# Patient Record
Sex: Female | Born: 1965 | Race: White | Hispanic: No | Marital: Single | Smoking: Current every day smoker
Health system: Southern US, Community
[De-identification: ages and names within clinical notes are randomized; demographics above are authoritative.]

## PROBLEM LIST (undated history)

## (undated) DIAGNOSIS — O00109 Unspecified tubal pregnancy without intrauterine pregnancy: Secondary | ICD-10-CM

## (undated) DIAGNOSIS — R440 Auditory hallucinations: Secondary | ICD-10-CM

## (undated) DIAGNOSIS — I10 Essential (primary) hypertension: Secondary | ICD-10-CM

## (undated) DIAGNOSIS — Z872 Personal history of diseases of the skin and subcutaneous tissue: Secondary | ICD-10-CM

## (undated) DIAGNOSIS — B182 Chronic viral hepatitis C: Secondary | ICD-10-CM

## (undated) DIAGNOSIS — IMO0001 Reserved for inherently not codable concepts without codable children: Secondary | ICD-10-CM

## (undated) DIAGNOSIS — K219 Gastro-esophageal reflux disease without esophagitis: Secondary | ICD-10-CM

## (undated) DIAGNOSIS — R441 Visual hallucinations: Secondary | ICD-10-CM

## (undated) DIAGNOSIS — Z720 Tobacco use: Secondary | ICD-10-CM

## (undated) DIAGNOSIS — F419 Anxiety disorder, unspecified: Secondary | ICD-10-CM

## (undated) DIAGNOSIS — F329 Major depressive disorder, single episode, unspecified: Secondary | ICD-10-CM

## (undated) DIAGNOSIS — B009 Herpesviral infection, unspecified: Secondary | ICD-10-CM

## (undated) DIAGNOSIS — F32A Depression, unspecified: Secondary | ICD-10-CM

## (undated) DIAGNOSIS — F319 Bipolar disorder, unspecified: Secondary | ICD-10-CM

## (undated) DIAGNOSIS — F209 Schizophrenia, unspecified: Secondary | ICD-10-CM

## (undated) DIAGNOSIS — F1010 Alcohol abuse, uncomplicated: Secondary | ICD-10-CM

## (undated) HISTORY — PX: WISDOM TOOTH EXTRACTION: SHX21

---

## 1989-04-18 HISTORY — PX: OTHER SURGICAL HISTORY: SHX169

## 1996-04-18 HISTORY — PX: LIVER BIOPSY: SHX301

## 1997-07-23 ENCOUNTER — Emergency Department (HOSPITAL_COMMUNITY): Admission: EM | Admit: 1997-07-23 | Discharge: 1997-07-23 | Payer: Self-pay | Admitting: Emergency Medicine

## 1997-11-06 ENCOUNTER — Inpatient Hospital Stay (HOSPITAL_COMMUNITY): Admission: AD | Admit: 1997-11-06 | Discharge: 1997-11-06 | Payer: Self-pay | Admitting: Obstetrics & Gynecology

## 1999-07-20 ENCOUNTER — Inpatient Hospital Stay (HOSPITAL_COMMUNITY): Admission: AD | Admit: 1999-07-20 | Discharge: 1999-07-20 | Payer: Self-pay | Admitting: *Deleted

## 1999-07-21 ENCOUNTER — Inpatient Hospital Stay (HOSPITAL_COMMUNITY): Admission: RE | Admit: 1999-07-21 | Discharge: 1999-07-21 | Payer: Self-pay | Admitting: *Deleted

## 1999-07-28 ENCOUNTER — Inpatient Hospital Stay (HOSPITAL_COMMUNITY): Admission: AD | Admit: 1999-07-28 | Discharge: 1999-07-28 | Payer: Self-pay | Admitting: Obstetrics

## 1999-09-08 ENCOUNTER — Other Ambulatory Visit: Admission: RE | Admit: 1999-09-08 | Discharge: 1999-09-08 | Payer: Self-pay | Admitting: Obstetrics and Gynecology

## 1999-10-22 ENCOUNTER — Ambulatory Visit (HOSPITAL_COMMUNITY): Admission: RE | Admit: 1999-10-22 | Discharge: 1999-10-22 | Payer: Self-pay | Admitting: Obstetrics and Gynecology

## 1999-10-22 ENCOUNTER — Encounter: Payer: Self-pay | Admitting: Obstetrics and Gynecology

## 2000-01-17 ENCOUNTER — Encounter: Payer: Self-pay | Admitting: Obstetrics and Gynecology

## 2000-01-17 ENCOUNTER — Ambulatory Visit (HOSPITAL_COMMUNITY): Admission: RE | Admit: 2000-01-17 | Discharge: 2000-01-17 | Payer: Self-pay | Admitting: Obstetrics and Gynecology

## 2000-03-14 ENCOUNTER — Ambulatory Visit (HOSPITAL_COMMUNITY): Admission: RE | Admit: 2000-03-14 | Discharge: 2000-03-14 | Payer: Self-pay | Admitting: Obstetrics and Gynecology

## 2000-03-14 ENCOUNTER — Encounter: Payer: Self-pay | Admitting: Obstetrics and Gynecology

## 2000-03-20 ENCOUNTER — Inpatient Hospital Stay (HOSPITAL_COMMUNITY): Admission: AD | Admit: 2000-03-20 | Discharge: 2000-03-22 | Payer: Self-pay | Admitting: Obstetrics and Gynecology

## 2003-06-06 ENCOUNTER — Encounter: Admission: RE | Admit: 2003-06-06 | Discharge: 2003-06-06 | Payer: Self-pay | Admitting: Otolaryngology

## 2003-12-11 ENCOUNTER — Ambulatory Visit (HOSPITAL_COMMUNITY): Admission: RE | Admit: 2003-12-11 | Discharge: 2003-12-11 | Payer: Self-pay | Admitting: Gastroenterology

## 2003-12-11 ENCOUNTER — Encounter (INDEPENDENT_AMBULATORY_CARE_PROVIDER_SITE_OTHER): Payer: Self-pay | Admitting: *Deleted

## 2004-03-13 ENCOUNTER — Inpatient Hospital Stay (HOSPITAL_COMMUNITY): Admission: AD | Admit: 2004-03-13 | Discharge: 2004-03-13 | Payer: Self-pay | Admitting: Obstetrics and Gynecology

## 2004-05-05 ENCOUNTER — Other Ambulatory Visit: Admission: RE | Admit: 2004-05-05 | Discharge: 2004-05-05 | Payer: Self-pay | Admitting: Obstetrics and Gynecology

## 2004-05-07 ENCOUNTER — Encounter (INDEPENDENT_AMBULATORY_CARE_PROVIDER_SITE_OTHER): Payer: Self-pay | Admitting: *Deleted

## 2004-05-07 ENCOUNTER — Ambulatory Visit (HOSPITAL_COMMUNITY): Admission: RE | Admit: 2004-05-07 | Discharge: 2004-05-07 | Payer: Self-pay | Admitting: Obstetrics and Gynecology

## 2004-05-07 HISTORY — PX: OTHER SURGICAL HISTORY: SHX169

## 2005-12-14 ENCOUNTER — Ambulatory Visit: Payer: Self-pay | Admitting: Internal Medicine

## 2005-12-16 ENCOUNTER — Ambulatory Visit: Payer: Self-pay | Admitting: Internal Medicine

## 2005-12-29 ENCOUNTER — Ambulatory Visit: Payer: Self-pay | Admitting: Gastroenterology

## 2006-02-02 ENCOUNTER — Ambulatory Visit: Payer: Self-pay | Admitting: Gastroenterology

## 2006-03-31 ENCOUNTER — Ambulatory Visit: Payer: Self-pay | Admitting: Gastroenterology

## 2006-05-10 ENCOUNTER — Ambulatory Visit: Payer: Self-pay | Admitting: Internal Medicine

## 2006-05-10 LAB — CONVERTED CEMR LAB
Albumin: 4.3 g/dL (ref 3.5–5.2)
Cholesterol: 250 mg/dL (ref 0–200)
Total Protein: 8 g/dL (ref 6.0–8.3)
Triglycerides: 57 mg/dL (ref 0–149)
VLDL: 11 mg/dL (ref 0–40)

## 2006-05-26 ENCOUNTER — Ambulatory Visit: Payer: Self-pay | Admitting: Internal Medicine

## 2006-06-01 ENCOUNTER — Ambulatory Visit: Payer: Self-pay | Admitting: Internal Medicine

## 2006-06-12 ENCOUNTER — Ambulatory Visit: Payer: Self-pay

## 2007-03-05 ENCOUNTER — Ambulatory Visit: Payer: Self-pay | Admitting: Internal Medicine

## 2007-03-05 DIAGNOSIS — K3189 Other diseases of stomach and duodenum: Secondary | ICD-10-CM

## 2007-03-05 DIAGNOSIS — R1013 Epigastric pain: Secondary | ICD-10-CM

## 2007-03-06 ENCOUNTER — Telehealth (INDEPENDENT_AMBULATORY_CARE_PROVIDER_SITE_OTHER): Payer: Self-pay | Admitting: *Deleted

## 2007-03-07 ENCOUNTER — Ambulatory Visit: Payer: Self-pay | Admitting: Internal Medicine

## 2007-03-08 LAB — CONVERTED CEMR LAB
Amylase: 97 units/L (ref 27–131)
Basophils Relative: 0.4 % (ref 0.0–1.0)
HCT: 40.9 % (ref 36.0–46.0)
Lymphocytes Relative: 18.8 % (ref 12.0–46.0)
MCHC: 35.3 g/dL (ref 30.0–36.0)
Neutro Abs: 4.7 10*3/uL (ref 1.4–7.7)
RBC: 4.19 M/uL (ref 3.87–5.11)
RDW: 12.2 % (ref 11.5–14.6)
WBC: 6.8 10*3/uL (ref 4.5–10.5)

## 2007-03-13 ENCOUNTER — Telehealth: Payer: Self-pay | Admitting: Internal Medicine

## 2007-03-16 ENCOUNTER — Ambulatory Visit: Payer: Self-pay | Admitting: Internal Medicine

## 2007-03-16 ENCOUNTER — Encounter (INDEPENDENT_AMBULATORY_CARE_PROVIDER_SITE_OTHER): Payer: Self-pay | Admitting: *Deleted

## 2007-04-02 ENCOUNTER — Encounter (INDEPENDENT_AMBULATORY_CARE_PROVIDER_SITE_OTHER): Payer: Self-pay | Admitting: *Deleted

## 2007-04-09 ENCOUNTER — Telehealth: Payer: Self-pay | Admitting: Internal Medicine

## 2007-05-18 ENCOUNTER — Ambulatory Visit: Payer: Self-pay | Admitting: Internal Medicine

## 2007-05-18 DIAGNOSIS — L723 Sebaceous cyst: Secondary | ICD-10-CM

## 2007-05-18 DIAGNOSIS — L0390 Cellulitis, unspecified: Secondary | ICD-10-CM

## 2007-05-18 DIAGNOSIS — L0291 Cutaneous abscess, unspecified: Secondary | ICD-10-CM | POA: Insufficient documentation

## 2007-05-23 ENCOUNTER — Telehealth: Payer: Self-pay | Admitting: Internal Medicine

## 2007-05-25 ENCOUNTER — Telehealth (INDEPENDENT_AMBULATORY_CARE_PROVIDER_SITE_OTHER): Payer: Self-pay | Admitting: Family Medicine

## 2007-08-08 ENCOUNTER — Encounter: Admission: RE | Admit: 2007-08-08 | Discharge: 2007-08-08 | Payer: Self-pay | Admitting: Obstetrics and Gynecology

## 2007-08-21 ENCOUNTER — Ambulatory Visit: Payer: Self-pay | Admitting: Internal Medicine

## 2007-08-21 DIAGNOSIS — M542 Cervicalgia: Secondary | ICD-10-CM | POA: Insufficient documentation

## 2007-10-04 ENCOUNTER — Telehealth: Payer: Self-pay | Admitting: Internal Medicine

## 2008-01-29 ENCOUNTER — Ambulatory Visit: Payer: Self-pay | Admitting: Gastroenterology

## 2008-02-27 ENCOUNTER — Ambulatory Visit: Payer: Self-pay | Admitting: Internal Medicine

## 2008-04-15 ENCOUNTER — Ambulatory Visit: Payer: Self-pay | Admitting: Gastroenterology

## 2008-11-24 ENCOUNTER — Emergency Department (HOSPITAL_COMMUNITY): Admission: EM | Admit: 2008-11-24 | Discharge: 2008-11-24 | Payer: Self-pay | Admitting: Emergency Medicine

## 2009-04-30 ENCOUNTER — Ambulatory Visit: Payer: Self-pay | Admitting: Gastroenterology

## 2009-10-12 ENCOUNTER — Emergency Department (HOSPITAL_BASED_OUTPATIENT_CLINIC_OR_DEPARTMENT_OTHER): Admission: EM | Admit: 2009-10-12 | Discharge: 2009-10-12 | Payer: Self-pay | Admitting: Emergency Medicine

## 2009-11-20 ENCOUNTER — Inpatient Hospital Stay (HOSPITAL_COMMUNITY): Admission: EM | Admit: 2009-11-20 | Discharge: 2009-11-21 | Payer: Self-pay | Admitting: Emergency Medicine

## 2010-01-17 ENCOUNTER — Other Ambulatory Visit: Payer: Self-pay | Admitting: Emergency Medicine

## 2010-01-18 ENCOUNTER — Ambulatory Visit: Payer: Self-pay | Admitting: Psychiatry

## 2010-01-18 ENCOUNTER — Inpatient Hospital Stay (HOSPITAL_COMMUNITY): Admission: AD | Admit: 2010-01-18 | Discharge: 2010-01-22 | Payer: Self-pay | Admitting: Psychiatry

## 2010-07-01 LAB — COMPREHENSIVE METABOLIC PANEL
AST: 64 U/L — ABNORMAL HIGH (ref 0–37)
Albumin: 4.4 g/dL (ref 3.5–5.2)
Alkaline Phosphatase: 66 U/L (ref 39–117)
BUN: 4 mg/dL — ABNORMAL LOW (ref 6–23)
CO2: 21 mEq/L (ref 19–32)
Chloride: 97 mEq/L (ref 96–112)
GFR calc Af Amer: >60 mL/min (ref >60)
GFR calc non Af Amer: >60 mL/min (ref >60)
Sodium: 131 mEq/L — ABNORMAL LOW (ref 135–145)
Total Bilirubin: 1.3 mg/dL — ABNORMAL HIGH (ref 0.3–1.2)

## 2010-07-01 LAB — ETHANOL: Alcohol, Ethyl (B): <5 mg/dL (ref 0–10)

## 2010-07-01 LAB — DIFFERENTIAL
Eosinophils Absolute: 0 10*3/uL (ref 0.0–0.7)
Lymphocytes Relative: 12 % (ref 12–46)
Lymphs Abs: 1.1 10*3/uL (ref 0.7–4.0)
Monocytes Relative: 7 % (ref 3–12)
Neutro Abs: 7.2 10*3/uL (ref 1.7–7.7)

## 2010-07-01 LAB — RAPID URINE DRUG SCREEN, HOSP PERFORMED: Amphetamines: NOT DETECTED

## 2010-07-01 LAB — CBC
Hemoglobin: 15.1 g/dL — ABNORMAL HIGH (ref 12.0–15.0)
MCH: 34.7 pg — ABNORMAL HIGH (ref 26.0–34.0)
MCV: 98.6 fL (ref 78.0–100.0)
Platelets: 181 10*3/uL (ref 150–400)
RBC: 4.36 MIL/uL (ref 3.87–5.11)
RDW: 14.3 % (ref 11.5–15.5)

## 2010-07-01 LAB — POCT PREGNANCY, URINE: Preg Test, Ur: NEGATIVE

## 2010-07-02 LAB — COMPREHENSIVE METABOLIC PANEL
ALT: 55 U/L — ABNORMAL HIGH (ref 0–35)
BUN: 1 mg/dL — ABNORMAL LOW (ref 6–23)
Creatinine, Ser: 0.62 mg/dL (ref 0.4–1.2)
Sodium: 126 mEq/L — ABNORMAL LOW (ref 135–145)
Total Bilirubin: 1.2 mg/dL (ref 0.3–1.2)

## 2010-07-02 LAB — POCT I-STAT, CHEM 8
BUN: <3 mg/dL — ABNORMAL LOW (ref 6–23)
Chloride: 90 mEq/L — ABNORMAL LOW (ref 96–112)
Creatinine, Ser: 0.7 mg/dL (ref 0.4–1.2)
HCT: 45 % (ref 36.0–46.0)
Hemoglobin: 15.3 g/dL — ABNORMAL HIGH (ref 12.0–15.0)
Potassium: 3.5 mEq/L (ref 3.5–5.1)
Sodium: 123 mEq/L — ABNORMAL LOW (ref 135–145)

## 2010-07-02 LAB — RAPID URINE DRUG SCREEN, HOSP PERFORMED
Amphetamines: NOT DETECTED
Barbiturates: NOT DETECTED
Benzodiazepines: NOT DETECTED
Tetrahydrocannabinol: POSITIVE — AB

## 2010-07-02 LAB — CBC
Platelets: 190 10*3/uL (ref 150–400)
RBC: 3.99 MIL/uL (ref 3.87–5.11)
RDW: 13.9 % (ref 11.5–15.5)
WBC: 7.3 10*3/uL (ref 4.0–10.5)

## 2010-07-02 LAB — BASIC METABOLIC PANEL
BUN: 2 mg/dL — ABNORMAL LOW (ref 6–23)
GFR calc Af Amer: >60 mL/min (ref >60)
GFR calc non Af Amer: >60 mL/min (ref >60)
Glucose, Bld: 74 mg/dL (ref 70–99)

## 2010-07-02 LAB — MAGNESIUM: Magnesium: 1.9 mg/dL (ref 1.5–2.5)

## 2010-07-02 LAB — DIFFERENTIAL
Basophils Absolute: 0 10*3/uL (ref 0.0–0.1)
Basophils Relative: 0 % (ref 0–1)
Eosinophils Absolute: 0 10*3/uL (ref 0.0–0.7)
Eosinophils Relative: 0 % (ref 0–5)
Lymphs Abs: 0.8 10*3/uL (ref 0.7–4.0)
Monocytes Relative: 12 % (ref 3–12)
Neutro Abs: 5.6 10*3/uL (ref 1.7–7.7)

## 2010-07-02 LAB — PROTIME-INR
INR: 1.02 (ref 0.00–1.49)
Prothrombin Time: 13.6 seconds (ref 11.6–15.2)

## 2010-07-04 LAB — DIFFERENTIAL
Basophils Absolute: 0 10*3/uL (ref 0.0–0.1)
Basophils Relative: 0 % (ref 0–1)
Eosinophils Relative: 1 % (ref 0–5)
Lymphocytes Relative: 7 % — ABNORMAL LOW (ref 12–46)
Monocytes Absolute: 0.8 10*3/uL (ref 0.1–1.0)

## 2010-07-04 LAB — COMPREHENSIVE METABOLIC PANEL
ALT: 59 U/L — ABNORMAL HIGH (ref 0–35)
AST: 64 U/L — ABNORMAL HIGH (ref 0–37)
Albumin: 4.3 g/dL (ref 3.5–5.2)
BUN: 3 mg/dL — ABNORMAL LOW (ref 6–23)
CO2: 26 mEq/L (ref 19–32)
Calcium: 9.4 mg/dL (ref 8.4–10.5)
GFR calc Af Amer: >60 mL/min (ref >60)
GFR calc non Af Amer: >60 mL/min (ref >60)
Sodium: 139 mEq/L (ref 135–145)
Total Bilirubin: 0.8 mg/dL (ref 0.3–1.2)

## 2010-07-04 LAB — CBC
HCT: 36.2 % (ref 36.0–46.0)
Hemoglobin: 12.7 g/dL (ref 12.0–15.0)
MCH: 33.9 pg (ref 26.0–34.0)
MCHC: 35 g/dL (ref 30.0–36.0)
MCV: 96.8 fL (ref 78.0–100.0)
Platelets: 168 10*3/uL (ref 150–400)
RDW: 14 % (ref 11.5–15.5)

## 2010-07-04 LAB — URINALYSIS, ROUTINE W REFLEX MICROSCOPIC
Bilirubin Urine: NEGATIVE
Ketones, ur: NEGATIVE mg/dL
Nitrite: NEGATIVE
Protein, ur: NEGATIVE mg/dL
Specific Gravity, Urine: 1.007 (ref 1.005–1.030)

## 2010-07-04 LAB — POCT TOXICOLOGY PANEL
Cocaine: POSITIVE
TCA Scrn: POSITIVE

## 2010-07-04 LAB — PREGNANCY, URINE: Preg Test, Ur: NEGATIVE

## 2010-07-04 LAB — ETHANOL: Alcohol, Ethyl (B): <10 mg/dL (ref 0–10)

## 2010-07-24 LAB — POCT CARDIAC MARKERS
Myoglobin, poc: 91.5 ng/mL (ref 12–200)
Troponin i, poc: <0.05 ng/mL (ref 0.00–0.09)

## 2010-07-24 LAB — DIFFERENTIAL
Basophils Absolute: 0 10*3/uL (ref 0.0–0.1)
Basophils Relative: 0 % (ref 0–1)
Lymphocytes Relative: 19 % (ref 12–46)
Neutro Abs: 4.2 10*3/uL (ref 1.7–7.7)
Neutrophils Relative %: 71 % (ref 43–77)

## 2010-07-24 LAB — CBC
MCHC: 33.7 g/dL (ref 30.0–36.0)
Platelets: 209 10*3/uL (ref 150–400)
RDW: 14.5 % (ref 11.5–15.5)

## 2010-07-24 LAB — POCT I-STAT, CHEM 8
BUN: <3 mg/dL — ABNORMAL LOW (ref 6–23)
Chloride: 105 mEq/L (ref 96–112)
HCT: 39 % (ref 36.0–46.0)
Sodium: 141 mEq/L (ref 135–145)

## 2010-07-24 LAB — URINE MICROSCOPIC-ADD ON

## 2010-07-24 LAB — URINALYSIS, ROUTINE W REFLEX MICROSCOPIC
Bilirubin Urine: NEGATIVE
Hgb urine dipstick: NEGATIVE
Ketones, ur: NEGATIVE mg/dL
Protein, ur: NEGATIVE mg/dL
Urobilinogen, UA: 1 mg/dL (ref 0.0–1.0)

## 2010-07-24 LAB — ETHANOL: Alcohol, Ethyl (B): <5 mg/dL (ref 0–10)

## 2010-07-24 LAB — GLUCOSE, CAPILLARY: Glucose-Capillary: 115 mg/dL — ABNORMAL HIGH (ref 70–99)

## 2010-07-24 LAB — PREGNANCY, URINE: Preg Test, Ur: NEGATIVE

## 2010-09-03 NOTE — Letter (Signed)
December 16, 2005     Tiffany [name], D.O.  [address]  [place], [place]  [address]   RE:  Tiffany, [name]  MRN:  [id number]  /  DOB:  Jun 03, 1965   Dear Dr. Marlou [name]:   I saw Tiffany [name] on December 14, 2005.  She had multiple complaints.  She  wanted her cholesterol checked but also had complaints about alopecia,  dyspepsia and arthralgias.  She stated that she had also requested a scan to  check for stroke or trauma to the brain.   By history she had an elevated cholesterol approximately 2005 at Urgent Care  in [place].  Those records are not available.  She is on no specific diet  and does not exercise.   She has had dyspepsia daily for several months and intermittently since age  45.  She has no dysphagia or melena.  Prilosec over the counter has been of  benefit.  Pepcid taken previously was of marginal benefit.   She does have a prior history of alopecia which was apparently treated with  intradermal  cortisone injections into the scalp.   She has had hip pain for approximately one year mainly on the left.  The  pain lasts for each 30 minutes each morning and is worse with certain  positions.  She has bilateral pain with standing.  She also had some pain in  the left shoulder.   As you are aware, she has had a history of elevated hepatic enzymes in the  past and has had a liver biopsy.  She was hospitalized for a manic episode  in 2004.  She has a history of hepatitis C and positive herpes serology.   FAMILY HISTORY:  Positive for coronary artery disease (stroke).   ALLERGIES:  There has been a history of intolerance or allergy to SULFA and  AMOXICILLIN.   MEDICATIONS:  1. She is on Zonegran 100 mg every day.  2. Wellbutrin 300 mg daily.  3. Klonopin 1 mg t.i.d.  4. Haldol 1 mg b.i.d.   PHYSICAL EXAMINATION:  Her weight was stable at 127.  Pulse was 64 and  regular.  Blood pressure was 100/72.  She did have alopecia central over the  crown.  There were  suggestive of some [name] follicle damage which suggests  possible localized fungal dermatitis.  The thyroid was slightly asymmetric  without definite nodularity.  Sh had mild rhonchi.  She smokes 1-1/2 packs a  day by history.  She had no organomegaly or lymphadenopathy.  I did not  appreciate scleral icterus or jaundice.  She does have a carpal tunnel  symptoms, Tinel's sign was negative.   Because of the shoulder pain, EKG was done and this was normal.  Because of  the family history of coronary artery disease and her history of elevated  cholesterol, as special fasting lipid profile was performed (NMR).  This is  pending . I would be hesitant to begin  Statins in view of her hepatic  disease history.   I did provide literature on Chantix but will defer to you as to any problems  with polypharmacy.  According to the [employer] by [place], this does  not have black box warnings.  The drug interactions were discussed.   I have recommended Nizoral shampoo and Nizoral topically; a dermatology  consult can be pursued if she fails to improve.  She has previously seen Dr.  Suan [name] in 2005 for eczema.   Omeprazole 20  mg daily was prescribed for the dyspepsia.  The triggers which  include ibuprofen and smoking were discussed with her.  Rheumatoid factor  was performed because of hip pain lasting over 30 minutes.  This was mildly  elevated at 52.  This may be a false positive related to her liver disease.  Other studies done to evaluate alopecia included RPR which was nonreactive,  CBC and differential and thyroid functions.  These were basically normal.   Of concern is the dramatic elevation of her hepatic enzymes.  Specifically,  her SGOT was 232 and SGPT was 223.  I defer to you as to whether the  Zonegran or any of her medications could be adjusted.  I will recommend a GI  consult with Tiffany [name] as soon as possible.  Long-term, he may wish to work  with the [facility].  This dictation will be done on  a stat basis; if Dr. Marzetta [name] group cannot see her December 16, 2005, then I  will have her evaluated by one of my partners at the [name]  office.  Her last liver functions were in 2003.  At that time, her SGOT was  61, and SGPT 78.   Thank you for you recommendation in reference to the dramatic liver function  tests in the context of the present medication regimen.    Sincerely,      Tiffany [name]. Tiffany Ren, MD, FACP, [facility]   WFH/MedQ  DD:  12/16/2005  DT:  12/16/2005  Job #:  [id number]   CC:    Tiffany [name]. Arlyce Dice, MD, Tiffany [name]

## 2010-09-03 NOTE — H&P (Signed)
Tiffany [name], KIEL NO.:  [id number]   MEDICAL RECORD NO.:  [id number]          PATIENT TYPE:  AMB   LOCATION:  SDC                           [facility]ILITY:  [facility]   PHYSICIAN:  Huel Cote, M.D. DATE OF BIRTH:  1965-11-20   DATE OF ADMISSION:  DATE OF DISCHARGE:                                HISTORY & PHYSICAL   This is a 45 year old G3, P1 who is coming in to undergo a laparoscopic  resection of a persistent ectopic pregnancy.  The patient was first noted to  be pregnant in November of 2005 and after given options, underwent a  treatment with methotrexate with her follow-up beta HCG's closely followed.  At the time of her diagnosis, the patient had a beta of 28,000, on March 13, 2004.  She got the appropriate dose of methotrexate and her beta's were  followed and dropped quite well.  The last beta being 52 on April 28, 2004.  The patient has also been followed by ultrasound given that the  ectopic pregnancy was rather large in size and unfortunately over the past 1-  1/2 months, this ectopic has shown absolutely no evidence of decreasing in  size.  It is approximately 5 cm x 4 cm x 3 cm with a visible fetus with a  crown-rump length of 7 weeks and 1 day and no fetal heart motion.  The  patient has begun to have some intermittent pain which is likely related to  intermittent torsion of this ectopic and given its persistent nature and the  pain, we have decided to proceed with resection.   PAST MEDICAL HISTORY:  Significant for anxiety and depression.  The patient  is also a hepatitis C carrier and has a history of alcoholism.   PAST SURGICAL HISTORY:  Her past surgical history includes a liver biopsy in  1998.  Her wisdom teeth were also removed at an earlier date.   OBSTETRICAL HISTORY:  Significant for one elective abortion, one spontaneous  vaginal delivery and one ectopic pregnancy, this current one.   PAST GYNECOLOGIC HISTORY:  The patient has  had no recent Pap smears but did  have one performed preoperatively which is pending.   FAMILY HISTORY:  Has no breast cancer.   CURRENT MEDICATIONS:  1.  Wellbutrin.  2.  Ativan.  3.  Concerta.  4.  Clarinex.  5.  Zonegran.  6.  Haldol.   ALLERGIES:  She has no known drug allergies.   She is a smoker of 1-1/2 packs a day and as stated, she has a history of  alcoholism and currently drinks approximately a six-pack a day with usually  two six-packs a day on the weekend.  She denies liquor use.   PHYSICAL EXAMINATION:  Significant for the abnormal pelvic exam.  VITAL SIGNS:  Her blood pressure is 105/50.  BREASTS:  Exam has no masses, discharge or adenopathy noted.  CARDIAC:  Exam is a regular rate and rhythm.  LUNGS:  Clear.  ABDOMEN:  Soft and nontender.  PELVIC: On pelvic exam, she has normal genitalia.  Her cervix  has no  lesions.  Her uterus is normal in size.  Her ectopic pregnancy is palpable  in the cul-de-sac and ultrasound confirmed this was unchanged just two days  prior to surgery and the ectopic is mobile and has also been palpated  anteriorly on previous pelvic exams.   The patient was counseled to the risks and benefits of proceeding with  surgery and understands that the laparoscopic approach will be performed and  a possible resection of the fallopian tube on the left performed.  Given the  size of the ectopic pregnancy, it is most likely that we will perform a  partial salpingectomy and the patient understands this.  We will also assess  the status of her other fallopian tube.  She does not wish permanent  sterilization although this was discussed with her given her health and  social problems.  The risks of bleeding, infection and possible damage to  bowel and bladder were discussed with the patient in detail.  She  understands these risks and desires to proceed with the surgery stated.      KR/MEDQ  D:  05/06/2004  T:  05/06/2004  Job:  [id number]

## 2010-09-03 NOTE — Discharge Summary (Signed)
[facility]  Patient:    Tiffany [name], Tiffany [name]                        MRN: [id number] Adm. Date:  [id number] Disc. Date: 03/22/00 Attending:  Oliver Pila                           Discharge Summary  DISCHARGE DIAGNOSES:          1. Term pregnancy at 39+ weeks delivered.                               2. Hepatitis C carrier.                               3. Group B strep carrier.                               4. History of alcohol abuse.                               5. Maternal depression.                               6. Maternal smoking.                               7. History of herpes simplex virus.                               8. Status post normal spontaneous vaginal                                  delivery.  DISCHARGE MEDICATIONS:        1. Percocet one to two tablets p.o. every                                  four hours p.r.n. pain.                               2. Motrin 600 mg p.o. every six hours.                               3. Wellbutrin 150 mg p.o. b.i.d.                               4. ________ 1 mg p.o. b.i.d.  HOSPITAL COURSE:              The patient is a 45 year old G2, P0-0-1-0 who was admitted at 39-4/7 weeks for induction given favorable cervix and multiple social issues.  The patient had a long history of alcohol abuse and had been sober her entire pregnancy.  She also had a history of hepatitis C  with increased LFTs which have normalized with her sober status throughout pregnancy. The patient is followed by Dr. Arlyce Dice for this.  The patient also has a long history of depression and had been diagnosed with bipolar disorder in the past.  She is on Wellbutrin and Klonopin and sees Fortunato Curling, M.D., in psychiatry for these issues.  She is also positive group B strep status and was treated in labor and she smokes a pack a day currently.  PRENATAL LABS:                Blood type A positive, antibody negative.  RPR nonreactive.  Rubella equivocal.  Hepatitis B surface antigen negative.  HIV negative. GC negative. Chlamydia negative.  GBS positive.  Hepatitis C positive.  PAST OB HISTORY:              She had an elective abortion in 1991.  PAST GYN HISTORY:             She has history of herpes without outbreaks in a long period of time.  PAST MEDICAL HISTORY:         History of hepatitis C as above.  PAST SURGICAL HISTORY:        History of a liver biopsy in 1998.  SOCIAL HISTORY:               She is a tobacco user, currently smoking a pack a day.  She has history of alcohol abuse with no alcohol during the pregnancy and no street drugs.  ADMISSION PHYSICAL EXAMINATION:  VITAL SIGNS:                  The patient was afebrile with stable vital signs.  LUNGS:                        Clear.  CARDIOVASCULAR:               Regular rate and rhythm.  ABDOMEN:                      Soft and nontender.  EFW was 6 to 6-1/2 pounds.  PELVIC:                       Cervix was 80% effaced, 2 cm dilated and -2 station.  HOSPITAL COURSE:              She had assisted rupture of membranes with clear fluid obtained.  Penicillin was given for group B strep status and she was begun on Pitocin augmentation thereafter. The patient began to dilate and received an epidural at 4 cm.  She continued to dilate and reached complete dilation at approximately 5:30 p.m.  She pushed well with a normal spontaneous vaginal delivery of a vigorous female infant over a midline episiotomy.  There was nuchal cord x 1. There was a shoulder dystocia which was relieved by McRoberts suprapubic and a midline episiotomy. Apgars were 8 and 9.  Weight was 7 pounds 6 ounces.  Placenta delivered spontaneously with trailing membranes removed manually.  The cord blood was obtained and a midline episiotomy is repaired with 2-0 Vicryl.  Cervix and rectum were intact.  The infant was moving both arms without difficulty.  The patient did  well postpartum and was seen by social work for her multiple issues.  On postpartum day #2 she was afebrile.  Pain was well-controlled. Hemoglobin  was stable at 10.  The patient was dealing well despite some anxiety about her discharge home.  She was replaced on her Wellbutrin and Klonopin and had several long discussions regarding her increased risk of relapse for alcohol abuse given the stress of a newborn and issues of drinking within her family.  The patient will be followed by social work as an outpatient and have nursing home visits in the home. Also she will see Dr. Katrinka Blazing within one week of discharge.  The patient is instructed to telephone should she have any alcohol cravings. DD:  03/22/00 TD:  03/22/00 Job: [id number]

## 2010-09-03 NOTE — Op Note (Signed)
NAMECHRISTYANN, [name] NO.:  [id number]   MEDICAL RECORD NO.:  [id number]          PATIENT TYPE:  AMB   LOCATION:  SDC                           [facility]ILITY:  [facility]   PHYSICIAN:  Huel Cote, M.D. DATE OF BIRTH:  January 25, 1966   DATE OF PROCEDURE:  05/07/2004  DATE OF DISCHARGE:                                 OPERATIVE REPORT   PREOPERATIVE DIAGNOSIS:  Persistent left ectopic pregnancy.   POSTOPERATIVE DIAGNOSIS:  Persistent left ectopic pregnancy with right  hydrosalpinx and fimbrial clubbing noted.   PROCEDURE:  Partial left salpingectomy with resection of ectopic pregnancy  laparoscopically.   SURGEON:  Huel Cote, M.D.   ASSISTANT:  Malachi Pro. Ambrose Mantle, M.D.   ANESTHESIA:  General.   SPECIMENS:  A partial left fallopian tube and ectopic pregnancy sent.   ESTIMATED BLOOD LOSS:  Minimal.   COMPLICATIONS:  None.   PROCEDURE:  The patient was taken to the operating room, where general  anesthesia was obtained without difficulty.  She was then prepped and draped  in the normal sterile fashion in the dorsal lithotomy position.  A speculum  was placed in the vagina, the cervix identified, and a Hulka tenaculum  placed on the cervix for uterine manipulation.  Attention was then turned to  the patient's abdomen, where a small infraumbilical incision was made  approximately 1 cm in length with the scalpel.  The Veress needle was then  introduced into this incision and intraperitoneal placement confirmed by  saline injection and aspiration.  The gas flow as then applied and  pneumoperitoneum obtained with approximately 2.5 L of CO2 gas.  The Veress  needle was then removed and the 10/11 trocar placed within this incision.  The camera was then introduced and the pelvis and abdomen carefully  inspected.  There was noted to be a left ectopic pregnancy at the far distal  end of the tube with very little normal tube remaining.  The ectopic  extended all the way  up to approximately the cornual region, and there were  no normal fimbriae noted.  The right fallopian tube was also inspected and  had a hydrosalpinx with clubbing of the fimbrial end noted and no visible  normal fimbriae.  The uterus was normal in size and configuration.  The  remainder of the pelvis was normal, cul-de-sac clear of any evidence of  endometriosis.  The liver was inspected and its surface appeared smooth;  however, there were several adhesions noted extending to the anterior  abdominal wall and its anterior surface.  There was no other pathology  noted.  The left fallopian tube was then grasped atraumatically just  adjacent to the ectopic pregnancy and retracted medially.  This exposed the  utero-ovarian ligament and the end of the fallopian tube.  The fallopian  tube was then transected at its midportion just above the ectopic and  resected in its entirety.  There was approximately 2 cm of a stump left on  the uterus after removal of the distal ectopic.  The pedicle itself was also  cauterized additionally with bipolar cautery and the tripolar  unit utilized  to transect and remove the ectopic itself.  The additional port sites had  previously been placed in the lower quadrants under direct visualization, 5  mm in size, and these were utilized to remove the ectopic.  The camera was  then replaced with a 5 mm camera, and this was introduced through the lower  left port site.  The Endobag was then introduced into the 10/11 trocar and  the ectopic pregnancy placed within the Endobag and elevated up to the  umbilical incision.  The Endobag sleeve and trocar were then removed and the  ectopic pregnancy was removed through the umbilicus after it was  decompressed with a needle and approximately 20-25 mL or clear and bloody  fluid drained into a syringe through the umbilical incision.  It was then  able to be removed through the umbilicus.  The fascia had been stretched  slightly;  therefore, it was exposed and closed with 0 Vicryl in a running  fashion.  The pedicle was once again inspected with the camera and no active  bleeding noted; therefore, the additional 5 mm trocars were removed and the  pneumoperitoneum reduced.  The skin was closed at all three sites with 3-0  Vicryl in a subcuticular stitch, and the Hulka tenaculum was removed from  the patient's vagina.  Sponge, lap and needle counts were correct x2, and  the patient was taken to the recovery room in stable condition.      KR/MEDQ  D:  05/07/2004  T:  05/07/2004  Job:  [id number]

## 2010-12-26 ENCOUNTER — Emergency Department (HOSPITAL_BASED_OUTPATIENT_CLINIC_OR_DEPARTMENT_OTHER)
Admission: EM | Admit: 2010-12-26 | Discharge: 2010-12-26 | Disposition: A | Discharge status: Home / Self Care | Payer: Medicare Other | Attending: Emergency Medicine | Admitting: Emergency Medicine

## 2010-12-26 ENCOUNTER — Emergency Department (INDEPENDENT_AMBULATORY_CARE_PROVIDER_SITE_OTHER): Payer: Medicare Other

## 2010-12-26 ENCOUNTER — Encounter: Payer: Self-pay | Admitting: *Deleted

## 2010-12-26 DIAGNOSIS — S99929A Unspecified injury of unspecified foot, initial encounter: Secondary | ICD-10-CM

## 2010-12-26 DIAGNOSIS — M7918 Myalgia, other site: Secondary | ICD-10-CM

## 2010-12-26 DIAGNOSIS — S6990XA Unspecified injury of unspecified wrist, hand and finger(s), initial encounter: Secondary | ICD-10-CM

## 2010-12-26 DIAGNOSIS — Y92009 Unspecified place in unspecified non-institutional (private) residence as the place of occurrence of the external cause: Secondary | ICD-10-CM | POA: Insufficient documentation

## 2010-12-26 DIAGNOSIS — IMO0001 Reserved for inherently not codable concepts without codable children: Secondary | ICD-10-CM | POA: Insufficient documentation

## 2010-12-26 DIAGNOSIS — F319 Bipolar disorder, unspecified: Secondary | ICD-10-CM | POA: Insufficient documentation

## 2010-12-26 DIAGNOSIS — S0993XA Unspecified injury of face, initial encounter: Secondary | ICD-10-CM

## 2010-12-26 DIAGNOSIS — F341 Dysthymic disorder: Secondary | ICD-10-CM | POA: Insufficient documentation

## 2010-12-26 HISTORY — DX: Anxiety disorder, unspecified: F41.9

## 2010-12-26 HISTORY — DX: Major depressive disorder, single episode, unspecified: F32.9

## 2010-12-26 HISTORY — DX: Depression, unspecified: F32.A

## 2010-12-26 HISTORY — DX: Bipolar disorder, unspecified: F31.9

## 2010-12-26 LAB — URINALYSIS, ROUTINE W REFLEX MICROSCOPIC
Bilirubin Urine: NEGATIVE
Hgb urine dipstick: NEGATIVE
Ketones, ur: NEGATIVE mg/dL
Nitrite: NEGATIVE
Urobilinogen, UA: 0.2 mg/dL (ref 0.0–1.0)
pH: 6 (ref 5.0–8.0)

## 2010-12-26 LAB — PREGNANCY, URINE: Preg Test, Ur: NEGATIVE

## 2010-12-26 MED ORDER — IBUPROFEN 600 MG PO TABS: 600.0000 mg | ORAL_TABLET | Freq: Four times a day (QID) | ORAL | Status: AC | PRN

## 2010-12-26 MED ORDER — IBUPROFEN 800 MG PO TABS
800.0000 mg | ORAL_TABLET | Freq: Once | ORAL | Status: AC
  Administered 2010-12-26: 800 mg via ORAL
  Filled 2010-12-26: qty 1

## 2010-12-26 MED ORDER — TETANUS-DIPHTH-ACELL PERTUSSIS 5-2.5-18.5 LF-MCG/0.5 IM SUSP
0.5000 mL | Freq: Once | INTRAMUSCULAR | Status: AC
  Administered 2010-12-26: 0.5 mL via INTRAMUSCULAR
  Filled 2010-12-26: qty 0.5

## 2010-12-26 MED ORDER — OXYCODONE-ACETAMINOPHEN 5-325 MG PO TABS
1.0000 | ORAL_TABLET | Freq: Once | ORAL | Status: AC
  Administered 2010-12-26: 1 via ORAL
  Filled 2010-12-26: qty 1

## 2010-12-26 MED ORDER — OXYCODONE-ACETAMINOPHEN 5-325 MG PO TABS: 2.0000 | ORAL_TABLET | ORAL | Status: AC | PRN

## 2010-12-26 MED ORDER — MORPHINE SULFATE 4 MG/ML IJ SOLN
4.0000 mg | Freq: Once | INTRAMUSCULAR | Status: AC
  Administered 2010-12-26: 4 mg via INTRAMUSCULAR
  Filled 2010-12-26: qty 1
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
## 2010-12-26 NOTE — ED Notes (Signed)
Patient fitted for crutches and demonstrates appropriate use. Patient initially states she cannot bear any weight on her foot. Upon demonstration of crutches while walking to restroom, patient appears frustrated with crutches and bears weight on her foot. Patient appears uncomfortable while bearing weight but is able to do so.

## 2010-12-26 NOTE — ED Notes (Signed)
Returned from xray
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
## 2011-03-22 ENCOUNTER — Encounter (HOSPITAL_COMMUNITY): Payer: Self-pay

## 2011-03-22 ENCOUNTER — Inpatient Hospital Stay (HOSPITAL_COMMUNITY)
Admission: EM | Admit: 2011-03-22 | Discharge: 2011-03-25 | DRG: 641 | Disposition: A | Discharge status: Other Acute Inpatient Hospital | Payer: Medicare Other | Attending: Internal Medicine | Admitting: Internal Medicine

## 2011-03-22 ENCOUNTER — Other Ambulatory Visit: Payer: Self-pay

## 2011-03-22 DIAGNOSIS — E876 Hypokalemia: Secondary | ICD-10-CM | POA: Diagnosis present

## 2011-03-22 DIAGNOSIS — F411 Generalized anxiety disorder: Secondary | ICD-10-CM | POA: Diagnosis present

## 2011-03-22 DIAGNOSIS — Z9119 Patient's noncompliance with other medical treatment and regimen: Secondary | ICD-10-CM

## 2011-03-22 DIAGNOSIS — E871 Hypo-osmolality and hyponatremia: Principal | ICD-10-CM | POA: Diagnosis present

## 2011-03-22 DIAGNOSIS — F1020 Alcohol dependence, uncomplicated: Secondary | ICD-10-CM | POA: Diagnosis present

## 2011-03-22 DIAGNOSIS — F329 Major depressive disorder, single episode, unspecified: Secondary | ICD-10-CM | POA: Diagnosis present

## 2011-03-22 DIAGNOSIS — F319 Bipolar disorder, unspecified: Secondary | ICD-10-CM | POA: Diagnosis present

## 2011-03-22 DIAGNOSIS — F29 Unspecified psychosis not due to a substance or known physiological condition: Secondary | ICD-10-CM | POA: Diagnosis present

## 2011-03-22 DIAGNOSIS — F32A Depression, unspecified: Secondary | ICD-10-CM | POA: Diagnosis present

## 2011-03-22 DIAGNOSIS — F309 Manic episode, unspecified: Secondary | ICD-10-CM

## 2011-03-22 DIAGNOSIS — Z91199 Patient's noncompliance with other medical treatment and regimen due to unspecified reason: Secondary | ICD-10-CM

## 2011-03-22 DIAGNOSIS — Z720 Tobacco use: Secondary | ICD-10-CM | POA: Diagnosis present

## 2011-03-22 DIAGNOSIS — F419 Anxiety disorder, unspecified: Secondary | ICD-10-CM | POA: Diagnosis present

## 2011-03-22 DIAGNOSIS — F17200 Nicotine dependence, unspecified, uncomplicated: Secondary | ICD-10-CM | POA: Diagnosis present

## 2011-03-22 HISTORY — DX: Personal history of diseases of the skin and subcutaneous tissue: Z87.2

## 2011-03-22 HISTORY — DX: Tobacco use: Z72.0

## 2011-03-22 HISTORY — DX: Herpesviral infection, unspecified: B00.9

## 2011-03-22 HISTORY — DX: Gastro-esophageal reflux disease without esophagitis: K21.9

## 2011-03-22 HISTORY — DX: Reserved for inherently not codable concepts without codable children: IMO0001

## 2011-03-22 HISTORY — DX: Unspecified tubal pregnancy without intrauterine pregnancy: O00.109

## 2011-03-22 HISTORY — DX: Chronic viral hepatitis C: B18.2

## 2011-03-22 HISTORY — DX: Auditory hallucinations: R44.0

## 2011-03-22 HISTORY — DX: Alcohol abuse, uncomplicated: F10.10

## 2011-03-22 HISTORY — DX: Visual hallucinations: R44.1

## 2011-03-22 LAB — RAPID URINE DRUG SCREEN, HOSP PERFORMED
Amphetamines: NOT DETECTED
Barbiturates: NOT DETECTED
Benzodiazepines: NOT DETECTED
Cocaine: NOT DETECTED
Opiates: NOT DETECTED
Tetrahydrocannabinol: NOT DETECTED

## 2011-03-22 LAB — URINALYSIS, ROUTINE W REFLEX MICROSCOPIC
Bilirubin Urine: NEGATIVE
Glucose, UA: NEGATIVE mg/dL
Hgb urine dipstick: NEGATIVE
Ketones, ur: NEGATIVE mg/dL
Leukocytes, UA: NEGATIVE
Nitrite: NEGATIVE
Protein, ur: NEGATIVE mg/dL
Specific Gravity, Urine: 1.005 (ref 1.005–1.030)
Urobilinogen, UA: 0.2 mg/dL (ref 0.0–1.0)
pH: 6 (ref 5.0–8.0)

## 2011-03-22 LAB — CBC
HCT: 35.1 % — ABNORMAL LOW (ref 36.0–46.0)
Hemoglobin: 12.7 g/dL (ref 12.0–15.0)
MCH: 33.2 pg (ref 26.0–34.0)
MCHC: 36.2 g/dL — ABNORMAL HIGH (ref 30.0–36.0)
MCV: 91.6 fL (ref 78.0–100.0)
Platelets: 219 10*3/uL (ref 150–400)
RBC: 3.83 MIL/uL — ABNORMAL LOW (ref 3.87–5.11)
RDW: 12.4 % (ref 11.5–15.5)
WBC: 6 10*3/uL (ref 4.0–10.5)

## 2011-03-22 LAB — BASIC METABOLIC PANEL
BUN: <3 mg/dL — ABNORMAL LOW (ref 6–23)
CO2: 23 mEq/L (ref 19–32)
Calcium: 9.4 mg/dL (ref 8.4–10.5)
Chloride: 90 mEq/L — ABNORMAL LOW (ref 96–112)
Creatinine, Ser: 0.55 mg/dL (ref 0.50–1.10)
GFR calc Af Amer: >90 mL/min (ref >90)
GFR calc non Af Amer: >90 mL/min (ref >90)
Glucose, Bld: 109 mg/dL — ABNORMAL HIGH (ref 70–99)
Potassium: 3.3 mEq/L — ABNORMAL LOW (ref 3.5–5.1)
Sodium: 126 mEq/L — ABNORMAL LOW (ref 135–145)

## 2011-03-22 LAB — ETHANOL: Alcohol, Ethyl (B): <11 mg/dL (ref 0–11)

## 2011-03-22 MED ORDER — ZIPRASIDONE MESYLATE 20 MG IM SOLR
10.0000 mg | Freq: Once | INTRAMUSCULAR | Status: AC
  Administered 2011-03-22: 10 mg via INTRAMUSCULAR
  Filled 2011-03-22: qty 20

## 2011-03-22 MED ORDER — ZOLPIDEM TARTRATE 5 MG PO TABS
5.0000 mg | ORAL_TABLET | Freq: Every evening | ORAL | Status: DC | PRN
  Administered 2011-03-23 – 2011-03-24 (×2): 5 mg via ORAL
  Filled 2011-03-22 (×2): qty 1

## 2011-03-22 MED ORDER — LORAZEPAM 1 MG PO TABS: 1.0000 mg | ORAL_TABLET | Freq: Once | ORAL | Status: DC

## 2011-03-22 MED ORDER — DOXEPIN HCL 25 MG PO CAPS
25.0000 mg | ORAL_CAPSULE | Freq: Every day | ORAL | Status: DC
  Administered 2011-03-22 – 2011-03-24 (×2): 25 mg via ORAL
  Filled 2011-03-22 (×6): qty 1

## 2011-03-22 MED ORDER — PANTOPRAZOLE SODIUM 40 MG PO TBEC
40.0000 mg | DELAYED_RELEASE_TABLET | Freq: Every day | ORAL | Status: DC
  Administered 2011-03-23 – 2011-03-25 (×3): 40 mg via ORAL
  Filled 2011-03-22 (×5): qty 1

## 2011-03-22 MED ORDER — ZIPRASIDONE MESYLATE 20 MG IM SOLR: 10.0000 mg | Freq: Once | INTRAMUSCULAR | Status: DC

## 2011-03-22 MED ORDER — ONDANSETRON HCL 4 MG PO TABS: 4.0000 mg | ORAL_TABLET | Freq: Three times a day (TID) | ORAL | Status: DC | PRN

## 2011-03-22 MED ORDER — ACETAMINOPHEN 325 MG PO TABS
650.0000 mg | ORAL_TABLET | ORAL | Status: DC | PRN
Start: 2011-03-22 — End: 2011-03-23

## 2011-03-22 MED ORDER — ALUM & MAG HYDROXIDE-SIMETH 200-200-20 MG/5ML PO SUSP
30.0000 mL | ORAL | Status: DC | PRN
Start: 2011-03-22 — End: 2011-03-23

## 2011-03-22 MED ORDER — LORAZEPAM 2 MG/ML IJ SOLN
INTRAMUSCULAR | Status: AC
  Administered 2011-03-22: 1 mg via INTRAMUSCULAR
  Filled 2011-03-22: qty 1

## 2011-03-22 MED ORDER — LORAZEPAM 2 MG/ML IJ SOLN: 1.0000 mg | Freq: Once | INTRAMUSCULAR | Status: DC

## 2011-03-22 MED ORDER — LORAZEPAM 2 MG/ML IJ SOLN
1.0000 mg | Freq: Once | INTRAMUSCULAR | Status: AC
  Administered 2011-03-22 (×2): 1 mg via INTRAMUSCULAR

## 2011-03-22 MED ORDER — FLUTICASONE PROPIONATE 50 MCG/ACT NA SUSP
2.0000 | Freq: Every day | NASAL | Status: DC
  Administered 2011-03-23 – 2011-03-25 (×3): 2 via NASAL
  Filled 2011-03-22 (×3): qty 16

## 2011-03-22 MED ORDER — LORAZEPAM 1 MG PO TABS
1.0000 mg | ORAL_TABLET | Freq: Three times a day (TID) | ORAL | Status: DC | PRN
  Administered 2011-03-23: 1 mg via ORAL
  Filled 2011-03-22 (×2): qty 1

## 2011-03-22 MED ORDER — NICOTINE 21 MG/24HR TD PT24
21.0000 mg | MEDICATED_PATCH | Freq: Once | TRANSDERMAL | Status: AC
  Administered 2011-03-22: 21 mg via TRANSDERMAL
  Filled 2011-03-22: qty 1

## 2011-03-22 MED ORDER — BUPROPION HCL ER (SR) 150 MG PO TB12
150.0000 mg | ORAL_TABLET | Freq: Two times a day (BID) | ORAL | Status: DC
  Filled 2011-03-22 (×3): qty 1

## 2011-03-22 MED ORDER — COLESEVELAM HCL 625 MG PO TABS
1250.0000 mg | ORAL_TABLET | Freq: Every day | ORAL | Status: DC
  Administered 2011-03-24 – 2011-03-25 (×2): 1250 mg via ORAL
  Filled 2011-03-22 (×5): qty 2

## 2011-03-22 MED ORDER — IBUPROFEN 600 MG PO TABS
600.0000 mg | ORAL_TABLET | Freq: Three times a day (TID) | ORAL | Status: DC | PRN
  Filled 2011-03-22: qty 1

## 2011-03-22 MED ORDER — RISPERIDONE 0.5 MG PO TABS: 0.5000 mg | ORAL_TABLET | ORAL | Status: DC | PRN

## 2011-03-22 MED ORDER — LORAZEPAM 1 MG PO TABS
1.0000 mg | ORAL_TABLET | Freq: Two times a day (BID) | ORAL | Status: DC
  Administered 2011-03-22 – 2011-03-23 (×4): 1 mg via ORAL
  Filled 2011-03-22 (×3): qty 1
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
## 2011-03-22 NOTE — BH Assessment (Signed)
Assessment Note  Assessment Counselor attempted to assess the patient at 20:00 and 21:00; however was unable as the patient was asleep and would not respond to Counselor due to medication that she was given in [facility] at 17:00. At 22:30 Counselor attempted to access the patient as she was awake. The patient informed was very drowsy with slurred speech. The patient had poor eye contact and stated that she did not wish to participate in the assessment at this time. Assessment Counselor informed the patient's RN.  SHATIA SINDONI is an 45 y.o. female.   Axis I: Unable to obtain Axis II: Unable to obtain Axis III:  Past Medical History  Diagnosis Date  . Bipolar disorder   . Anxiety   . Depression   . Auditory hallucination   . Hallucination, visual   . Psychiatric hospitalization    Axis IV: Unable to obtain Axis V: Unable to obtain  Past Medical History:  Past Medical History  Diagnosis Date  . Bipolar disorder   . Anxiety   . Depression   . Auditory hallucination   . Hallucination, visual   . Psychiatric hospitalization     History reviewed. No pertinent past surgical history.  Family History: History reviewed. No pertinent family history.  Social History:  reports that she has been smoking.  She does not have any smokeless tobacco history on file. She reports that she drinks alcohol. She reports that she uses illicit drugs.  Allergies:  Allergies  Allergen Reactions  . Sulfa Antibiotics Nausea And Vomiting  . Hydrocodone Other (See Comments)    unknown    Home Medications:  Medications Prior to Admission  Medication Dose Route Frequency Provider Last Rate Last Dose  . acetaminophen (TYLENOL) tablet 650 mg  650 mg Oral Q4H PRN Raeford Razor, MD      . alum & mag hydroxide-simeth (MAALOX/MYLANTA) 200-200-20 MG/5ML suspension 30 mL  30 mL Oral PRN Raeford Razor, MD      . buPROPion [facility] SR) 12 hr tablet 150 mg  150 mg Oral BID Raeford Razor, MD      . colesevelam  [facility]) tablet 1,250 mg  1,250 mg Oral Daily Raeford Razor, MD      . doxepin [facility]) capsule 25 mg  25 mg Oral QHS Raeford Razor, MD   25 mg at 03/22/11 2142  . fluticasone (FLONASE) 50 MCG/ACT nasal spray 2 spray  2 spray Each Nare Daily Raeford Razor, MD      . ibuprofen (ADVIL,MOTRIN) tablet 600 mg  600 mg Oral Q8H PRN Raeford Razor, MD      . LORazepam (ATIVAN) injection 1 mg  1 mg Intramuscular Once Raeford Razor, MD   1 mg at 03/22/11 1708  . LORazepam (ATIVAN) tablet 1 mg  1 mg Oral Q8H PRN Raeford Razor, MD      . LORazepam (ATIVAN) tablet 1 mg  1 mg Oral BID Raeford Razor, MD   1 mg at 03/22/11 2138  . nicotine (NICODERM CQ - dosed in mg/24 hours) patch 21 mg  21 mg Transdermal Once Raeford Razor, MD   21 mg at 03/22/11 1707  . ondansetron (ZOFRAN) tablet 4 mg  4 mg Oral Q8H PRN Raeford Razor, MD      . pantoprazole (PROTONIX) EC tablet 40 mg  40 mg Oral Q1200 Raeford Razor, MD      . ziprasidone (GEODON) injection 10 mg  10 mg Intramuscular Once Raeford Razor, MD   10 mg at 03/22/11 1707  .  zolpidem (AMBIEN) tablet 5 mg  5 mg Oral QHS PRN Raeford Razor, MD      . DISCONTD: LORazepam (ATIVAN) injection 1 mg  1 mg Intravenous Once Raeford Razor, MD      . DISCONTD: LORazepam (ATIVAN) tablet 1 mg  1 mg Oral Once Raeford Razor, MD      . DISCONTD: risperiDONE (RISPERDAL) tablet 0.5 mg  0.5 mg Oral Continuous PRN Raeford Razor, MD      . DISCONTD: ziprasidone (GEODON) injection 10 mg  10 mg Intramuscular Once Raeford Razor, MD       Medications Prior to Admission  Medication Sig Dispense Refill  . buPROPion (WELLBUTRIN SR) 150 MG 12 hr tablet Take 150 mg by mouth 2 (two) times daily.       . colesevelam (WELCHOL) 625 MG tablet Take 1,250 mg by mouth daily.       Marland Kitchen doxepin (SINEQUAN) 25 MG capsule Take 25 mg by mouth at bedtime.       . fluticasone (FLONASE) 50 MCG/ACT nasal spray Place 2 sprays into the nose daily.        Marland Kitchen MILK THISTLE PO Take 3 tablets by mouth daily.           OB/GYN Status:  No LMP recorded. Patient is not currently having periods (Reason: Perimenopausal).           Mental Status Report Motor Activity: Agitation;Hyperactivity;Mannerisms;Restlessness                  Values / Beliefs Cultural Requests During Hospitalization: None Spiritual Requests During Hospitalization: None              Disposition:     On Site Evaluation by:   Reviewed with Physician:     Sheran Spine Makenzi Bannister 03/22/2011 11:06 PM

## 2011-03-22 NOTE — ED Notes (Signed)
Pt wondered in the hall, screaming that she has no reason to be here. And started to get agitated, won't go back to her room. Pt offered her home meds and pt refused all her meds. Pt continued to shown anger and aggression toward ED staff. Pt medicated with Geodon and Ativan.

## 2011-03-22 NOTE — ED Notes (Addendum)
Pt transferred from triage to [facility] ED room 38 with [employer], handcuffed, cooperative at this moment. Willing to take meds without any refusal. NT drawing labs and doing EKG at this moment. Will do a full assessment when done.

## 2011-03-22 NOTE — ED Notes (Signed)
Patient ranting with thoughts rambling. Watching tv and stating "Hitler did what he had to do. I gotta do what I gotta do". Patient spitting on floor and walking around in the hall. [employer] requested patient to return to room. Patient did comply with prompting. Patient asking to "speak to my daughter in my heart." [employer] and security at bedside at this time with patient.

## 2011-03-22 NOTE — ED Notes (Signed)
Patient up at desk requesting toothpaste and toothbrush. Provided for patient.

## 2011-03-22 NOTE — ED Notes (Signed)
Belonging bag x 1 labeled and pt and belongs waunded and cleared by security- Pt purse in bag-

## 2011-03-22 NOTE — ED Notes (Signed)
Pt reassessed: pt is awake, alert, and oriented. Appeared paranoid, and suspicious at times. Denied SI/HI, hallucination at this moment. Pt sts "it's not fair that I am here". Pt is IVC, has flight of ideas, poor insight, urinated herself in triage, cooperative at this time, limited judgement,  Poor thought process,somewhat coherent and has hallucination despite pt's denial.  Per IVC paper from the mother, it wrote that pt talks all day and night to her voices. Has aggressive behaviors such screaming, pushing, banging, breaking stuff at home. Pt previously seem at [facility] in the past for similar.

## 2011-03-22 NOTE — ED Provider Notes (Signed)
History    45yF brought in by [employer] with IVC papers. Pt's mother petitioning because of responding to internal stimuli, being aggressive and breaking things. Pt appears manic on my exam. Laughing inapropriately. Pacing and wringing hands at time. When asked about what precipitated bringing pt to ED pt being evasive with questioning. States she doesn't know. Visibly agitated. Psych hx per review of records including bipolar, depression, psychosis.  CSN: [id number] Arrival date & time: 03/22/2011  2:36 PM   First MD Initiated Contact with Patient 03/22/11 1518      Chief Complaint  Patient presents with  . Medical Clearance  . Manic Behavior  . Hallucinations    (Consider location/radiation/quality/duration/timing/severity/associated sxs/prior treatment) HPI  Past Medical History  Diagnosis Date  . Bipolar disorder   . Anxiety   . Depression   . Auditory hallucination   . Hallucination, visual   . Psychiatric hospitalization     History reviewed. No pertinent past surgical history.  History reviewed. No pertinent family history.  History  Substance Use Topics  . Smoking status: Current Everyday Smoker  . Smokeless tobacco: Not on file  . Alcohol Use: Yes    OB History    Grav Para Term Preterm Abortions TAB SAB Ect Mult Living                  Review of Systems   Review of symptoms negative unless otherwise noted in HPI.   Allergies  Sulfa antibiotics and Hydrocodone  Home Medications   Current Outpatient Rx  Name Route Sig Dispense Refill  . BUPROPION HCL ER (SR) 150 MG PO TB12 Oral Take 150 mg by mouth 2 (two) times daily.      . COLESEVELAM HCL 625 MG PO TABS Oral Take 1,250 mg by mouth daily.     Marland Kitchen DOXEPIN HCL 25 MG PO CAPS Oral Take 25 mg by mouth at bedtime.      Marland Kitchen FLUTICASONE PROPIONATE 50 MCG/ACT NA SUSP Nasal Place 2 sprays into the nose daily.      Marland Kitchen LORAZEPAM 1 MG PO TABS Oral Take 1 mg by mouth 2 (two) times daily.      Marland Kitchen MILK THISTLE PO Oral  Take 3 tablets by mouth daily.      Marland Kitchen OMEPRAZOLE 20 MG PO CPDR Oral Take 20 mg by mouth daily.      Marland Kitchen RISPERIDONE 0.5 MG PO TABS Oral Take 0.5 mg by mouth continuous as needed. For panic attack       BP 148/99  Pulse 112  Temp(Src) 97.7 F (36.5 C) (Oral)  Resp 20  SpO2 96%  Physical Exam  Nursing note and vitals reviewed. Constitutional: She appears well-developed and well-nourished. No distress.  HENT:  Head: Normocephalic and atraumatic.  Eyes: Conjunctivae are normal. Right eye exhibits no discharge. Left eye exhibits no discharge.  Neck: Neck supple.  Cardiovascular: Normal rate, regular rhythm and normal heart sounds.  Exam reveals no gallop and no friction rub.   No murmur heard. Pulmonary/Chest: Effort normal and breath sounds normal. No respiratory distress.  Abdominal: Soft. She exhibits no distension. There is no tenderness.  Musculoskeletal: She exhibits no edema and no tenderness.  Neurological: She is alert.  Skin: Skin is warm and dry.  Psychiatric:       Agitated. Pacing room. Wearing one sock. Wrining hands at one time. Keeps playing with scrub pants. Laughing inappropriately. Tangential thought process.    ED Course  Procedures (including critical care time)  Labs Reviewed  BASIC METABOLIC PANEL - Abnormal; Notable for the following:    Sodium 126 (*)    Potassium 3.3 (*)    Chloride 90 (*)    Glucose, Bld 109 (*)    BUN <3 (*) REPEATED TO VERIFY   All other components within normal limits  CBC - Abnormal; Notable for the following:    RBC 3.83 (*)    HCT 35.1 (*)    MCHC 36.2 (*)    All other components within normal limits  URINALYSIS, ROUTINE W REFLEX MICROSCOPIC  URINE RAPID DRUG SCREEN (HOSP PERFORMED)  ETHANOL   No results found.   EKG:  Rhythm: sinus tach Rate: 102 Axis: normal Intervals: normal ST segments: normal  1. Mania   2. Hyponatremia       MDM  45yF. Appears to be manic. Pt with IVC papers which will uphold. Do not  feel pt safe to take care of self. Hyponatremic. Per review of records has been as low as 123 previously. Appears to be more chronic in nature and would suspect more confusion than actually manic if was symptomatic from it. Will discuss with ACT.        Raeford Razor, MD 03/23/11 [phone]

## 2011-03-22 NOTE — ED Notes (Signed)
IVC papers present- Pt here with [employer]- agitated

## 2011-03-23 ENCOUNTER — Encounter (HOSPITAL_COMMUNITY): Payer: Self-pay | Admitting: Family Medicine

## 2011-03-23 DIAGNOSIS — Z720 Tobacco use: Secondary | ICD-10-CM | POA: Diagnosis present

## 2011-03-23 DIAGNOSIS — F329 Major depressive disorder, single episode, unspecified: Secondary | ICD-10-CM | POA: Diagnosis present

## 2011-03-23 DIAGNOSIS — F419 Anxiety disorder, unspecified: Secondary | ICD-10-CM | POA: Diagnosis present

## 2011-03-23 DIAGNOSIS — F319 Bipolar disorder, unspecified: Secondary | ICD-10-CM

## 2011-03-23 DIAGNOSIS — E876 Hypokalemia: Secondary | ICD-10-CM | POA: Diagnosis present

## 2011-03-23 DIAGNOSIS — F32A Depression, unspecified: Secondary | ICD-10-CM | POA: Diagnosis present

## 2011-03-23 DIAGNOSIS — E871 Hypo-osmolality and hyponatremia: Secondary | ICD-10-CM | POA: Diagnosis present

## 2011-03-23 LAB — URINALYSIS, ROUTINE W REFLEX MICROSCOPIC
Bilirubin Urine: NEGATIVE
Glucose, UA: NEGATIVE mg/dL
Hgb urine dipstick: NEGATIVE
Ketones, ur: NEGATIVE mg/dL
Nitrite: NEGATIVE
Specific Gravity, Urine: 1.005 (ref 1.005–1.030)
pH: 7.5 (ref 5.0–8.0)

## 2011-03-23 LAB — BASIC METABOLIC PANEL
CO2: 25 mEq/L (ref 19–32)
Calcium: 9.6 mg/dL (ref 8.4–10.5)
GFR calc non Af Amer: >90 mL/min (ref >90)
Glucose, Bld: 121 mg/dL — ABNORMAL HIGH (ref 70–99)
Potassium: 3.4 mEq/L — ABNORMAL LOW (ref 3.5–5.1)
Sodium: 129 mEq/L — ABNORMAL LOW (ref 135–145)

## 2011-03-23 LAB — HEPATIC FUNCTION PANEL
ALT: 99 U/L — ABNORMAL HIGH (ref 0–35)
AST: 110 U/L — ABNORMAL HIGH (ref 0–37)
Albumin: 4 g/dL (ref 3.5–5.2)
Alkaline Phosphatase: 104 U/L (ref 39–117)
Total Bilirubin: 0.5 mg/dL (ref 0.3–1.2)
Total Protein: 8.1 g/dL (ref 6.0–8.3)

## 2011-03-23 LAB — OSMOLALITY, URINE: Osmolality, Ur: 92 mOsm/kg — ABNORMAL LOW (ref 390–1090)

## 2011-03-23 LAB — TSH: TSH: 1.956 u[IU]/mL (ref 0.350–4.500)

## 2011-03-23 MED ORDER — FOLIC ACID 1 MG PO TABS
1.0000 mg | ORAL_TABLET | Freq: Every day | ORAL | Status: DC
  Administered 2011-03-23 – 2011-03-25 (×3): 1 mg via ORAL
  Filled 2011-03-23 (×4): qty 1

## 2011-03-23 MED ORDER — ACETAMINOPHEN 325 MG PO TABS: 650.0000 mg | ORAL_TABLET | Freq: Four times a day (QID) | ORAL | Status: DC | PRN

## 2011-03-23 MED ORDER — THIAMINE HCL 100 MG/ML IJ SOLN
100.0000 mg | Freq: Every day | INTRAMUSCULAR | Status: DC
  Filled 2011-03-23 (×3): qty 2

## 2011-03-23 MED ORDER — FOLIC ACID 1 MG PO TABS: 1.0000 mg | ORAL_TABLET | Freq: Every day | ORAL | Status: DC

## 2011-03-23 MED ORDER — LORAZEPAM 1 MG PO TABS
1.0000 mg | ORAL_TABLET | Freq: Four times a day (QID) | ORAL | Status: DC | PRN
  Administered 2011-03-23: 1 mg via ORAL
  Filled 2011-03-23: qty 1
  Filled 2011-03-23: qty 2

## 2011-03-23 MED ORDER — VITAMIN B-1 100 MG PO TABS
100.0000 mg | ORAL_TABLET | Freq: Every day | ORAL | Status: DC
  Administered 2011-03-23 – 2011-03-25 (×3): 100 mg via ORAL
  Filled 2011-03-23 (×4): qty 1

## 2011-03-23 MED ORDER — IBUPROFEN 600 MG PO TABS
600.0000 mg | ORAL_TABLET | Freq: Four times a day (QID) | ORAL | Status: DC | PRN
  Filled 2011-03-23: qty 1

## 2011-03-23 MED ORDER — NAPROXEN 500 MG PO TABS
500.0000 mg | ORAL_TABLET | Freq: Two times a day (BID) | ORAL | Status: DC
  Administered 2011-03-23 – 2011-03-25 (×3): 500 mg via ORAL
  Filled 2011-03-23 (×5): qty 1

## 2011-03-23 MED ORDER — SODIUM CHLORIDE 0.9 % IJ SOLN
3.0000 mL | Freq: Two times a day (BID) | INTRAMUSCULAR | Status: DC
  Administered 2011-03-24: 3 mL via INTRAVENOUS

## 2011-03-23 MED ORDER — OXYCODONE-ACETAMINOPHEN 5-325 MG PO TABS: 2.0000 | ORAL_TABLET | ORAL | Status: DC | PRN

## 2011-03-23 MED ORDER — THERA M PLUS PO TABS
1.0000 | ORAL_TABLET | Freq: Every day | ORAL | Status: DC
  Administered 2011-03-23: 1 via ORAL
  Filled 2011-03-23: qty 1

## 2011-03-23 MED ORDER — LORAZEPAM 2 MG/ML IJ SOLN: 1.0000 mg | Freq: Four times a day (QID) | INTRAMUSCULAR | Status: DC | PRN

## 2011-03-23 MED ORDER — LORAZEPAM 1 MG PO TABS
1.0000 mg | ORAL_TABLET | Freq: Four times a day (QID) | ORAL | Status: DC | PRN
  Administered 2011-03-23: 2 mg via ORAL
  Administered 2011-03-24 – 2011-03-25 (×6): 1 mg via ORAL
  Filled 2011-03-23 (×6): qty 1

## 2011-03-23 MED ORDER — OXAPROZIN 600 MG PO TABS
600.0000 mg | ORAL_TABLET | Freq: Two times a day (BID) | ORAL | Status: DC
Start: 2011-03-23 — End: 2011-03-25
  Administered 2011-03-23 – 2011-03-25 (×4): 600 mg via ORAL
  Filled 2011-03-23 (×8): qty 1

## 2011-03-23 MED ORDER — LORAZEPAM 2 MG/ML IJ SOLN
0.0000 mg | Freq: Four times a day (QID) | INTRAMUSCULAR | Status: AC
  Administered 2011-03-24: 1 mg via INTRAVENOUS

## 2011-03-23 MED ORDER — LORAZEPAM 2 MG/ML IJ SOLN: 0.0000 mg | Freq: Two times a day (BID) | INTRAMUSCULAR | Status: DC

## 2011-03-23 MED ORDER — FLEET ENEMA 7-19 GM/118ML RE ENEM: 1.0000 | ENEMA | Freq: Once | RECTAL | Status: AC | PRN

## 2011-03-23 MED ORDER — ALUM & MAG HYDROXIDE-SIMETH 200-200-20 MG/5ML PO SUSP: 30.0000 mL | Freq: Four times a day (QID) | ORAL | Status: DC | PRN

## 2011-03-23 MED ORDER — RISPERIDONE 0.5 MG PO TABS: 0.5000 mg | ORAL_TABLET | Freq: Two times a day (BID) | ORAL | Status: DC

## 2011-03-23 MED ORDER — THERA M PLUS PO TABS
1.0000 | ORAL_TABLET | Freq: Every day | ORAL | Status: DC
  Administered 2011-03-24 – 2011-03-25 (×2): 1 via ORAL
  Filled 2011-03-23 (×3): qty 1

## 2011-03-23 MED ORDER — RISPERIDONE 1 MG PO TABS
1.0000 mg | ORAL_TABLET | Freq: Two times a day (BID) | ORAL | Status: DC
  Administered 2011-03-23 – 2011-03-25 (×5): 1 mg via ORAL
  Filled 2011-03-23 (×6): qty 1

## 2011-03-23 MED ORDER — HYDROXYZINE HCL 25 MG PO TABS
25.0000 mg | ORAL_TABLET | Freq: Four times a day (QID) | ORAL | Status: DC | PRN
  Filled 2011-03-23: qty 1

## 2011-03-23 MED ORDER — POTASSIUM CHLORIDE CRYS ER 20 MEQ PO TBCR
40.0000 meq | EXTENDED_RELEASE_TABLET | Freq: Once | ORAL | Status: AC
  Administered 2011-03-23: 40 meq via ORAL
  Filled 2011-03-23: qty 2

## 2011-03-23 MED ORDER — SENNOSIDES-DOCUSATE SODIUM 8.6-50 MG PO TABS
1.0000 | ORAL_TABLET | Freq: Every evening | ORAL | Status: DC | PRN
  Filled 2011-03-23: qty 1

## 2011-03-23 MED ORDER — VITAMIN B-1 100 MG PO TABS
100.0000 mg | ORAL_TABLET | Freq: Every day | ORAL | Status: DC
  Filled 2011-03-23: qty 1

## 2011-03-23 MED ORDER — SORBITOL 70 % SOLN
30.0000 mL | Freq: Every day | Status: DC | PRN
  Filled 2011-03-23: qty 30

## 2011-03-23 MED ORDER — DOCUSATE SODIUM 100 MG PO CAPS
100.0000 mg | ORAL_CAPSULE | Freq: Two times a day (BID) | ORAL | Status: DC
  Administered 2011-03-24 – 2011-03-25 (×3): 100 mg via ORAL
  Filled 2011-03-23 (×7): qty 1

## 2011-03-23 MED ORDER — ACETAMINOPHEN 650 MG RE SUPP: 650.0000 mg | Freq: Four times a day (QID) | RECTAL | Status: DC | PRN

## 2011-03-23 NOTE — BH Assessment (Signed)
Assessment Note   Tiffany [name] is an 45 y.o. female.    PRESENTING PROBLEM:   Tiffany [name] is a 45 year old white female that was brought to the ER by the [employer].  Her mother executed an IVC due to erratic behaviors.  Tiffany [name] reports that she has been diagnosed with Bi-Polar Disorder in the past.  Tiffany [name] reports that she has had multiple hospitalizations and is not able to remember how many times she was hospitalized throughout the years. Tiffany [name] reports that she was hospitalized due to hearing and seeing voices that were not there.  Tiffany [name] reports that she is depressed and does not have any energy.  Tiffany [name] reports that she resides with her mother and has been withdrawing from her mother and talking more to her hallucinations.  Tiffany [name] denies any level of aggression.  However, the IVC petition reports that she was screaming and pushing her mother.  The IVC petition also reports that her 67 year old daughter is afraid of her.  Tiffany [name] denies any self-injurious behaviors.  Tiffany [name] was not able to contract for safety.  Tiffany [name] reports that cannot remember if she has been taking her medication as prescribed.    DISPOSITION: Therapist spoke to Dr. Rhae Hammock and he informed me that the patient's is not medically cleared due to her sodium being very low (126).  In addition to Dr. Rhae Hammock having concerns regarding her CBC.  Dr. Rhae Hammock will be requesting a medical consult.  Therapist informed Dr. Norlene Campbell of the information regarding this patient's medical clearance status  Axis I: Bipolar, Depressed Axis II Deferred  Axis III   Past Medical History:  Past Medical History  Diagnosis Date  . Bipolar disorder   . Anxiety   . Depression   . Auditory hallucination   . Hallucination, visual   . Psychiatric hospitalization    Axis IV Problems with primary support, Financial problems, Occupational problems, Housing problems  Axis V 35   History reviewed. No pertinent past surgical history.  Family History: History reviewed. No  pertinent family history.  Social History:  reports that she has been smoking.  She does not have any smokeless tobacco history on file. She reports that she drinks alcohol. She reports that she uses illicit drugs.  Allergies:  Allergies  Allergen Reactions  . Sulfa Antibiotics Nausea And Vomiting  . Hydrocodone Other (See Comments)    unknown    Home Medications:  Medications Prior to Admission  Medication Dose Route Frequency Provider Last Rate Last Dose  . acetaminophen (TYLENOL) tablet 650 mg  650 mg Oral Q4H PRN Raeford Razor, MD      . alum & mag hydroxide-simeth (MAALOX/MYLANTA) 200-200-20 MG/5ML suspension 30 mL  30 mL Oral PRN Raeford Razor, MD      . colesevelam [facility]) tablet 1,250 mg  1,250 mg Oral Daily Raeford Razor, MD      . doxepin [facility]) capsule 25 mg  25 mg Oral QHS Raeford Razor, MD   25 mg at 03/22/11 2142  . fluticasone (FLONASE) 50 MCG/ACT nasal spray 2 spray  2 spray Each Nare Daily Raeford Razor, MD      . ibuprofen (ADVIL,MOTRIN) tablet 600 mg  600 mg Oral Q8H PRN Raeford Razor, MD      . LORazepam (ATIVAN) injection 1 mg  1 mg Intramuscular Once Raeford Razor, MD   1 mg at 03/22/11 1708  . LORazepam (ATIVAN) tablet 1 mg  1 mg Oral Q8H PRN Raeford Razor, MD      .  LORazepam (ATIVAN) tablet 1 mg  1 mg Oral BID Raeford Razor, MD   1 mg at 03/22/11 2138  . nicotine (NICODERM CQ - dosed in mg/24 hours) patch 21 mg  21 mg Transdermal Once Raeford Razor, MD   21 mg at 03/22/11 1707  . ondansetron (ZOFRAN) tablet 4 mg  4 mg Oral Q8H PRN Raeford Razor, MD      . pantoprazole (PROTONIX) EC tablet 40 mg  40 mg Oral Q1200 Raeford Razor, MD      . risperiDONE (RISPERDAL) tablet 0.5 mg  0.5 mg Oral BID Katheren Puller, MD      . ziprasidone (GEODON) injection 10 mg  10 mg Intramuscular Once Raeford Razor, MD   10 mg at 03/22/11 1707  . zolpidem (AMBIEN) tablet 5 mg  5 mg Oral QHS PRN Raeford Razor, MD      . DISCONTD: buPROPion [facility] SR) 12 hr tablet 150 mg   150 mg Oral BID Raeford Razor, MD      . DISCONTD: LORazepam (ATIVAN) injection 1 mg  1 mg Intravenous Once Raeford Razor, MD      . DISCONTD: LORazepam (ATIVAN) tablet 1 mg  1 mg Oral Once Raeford Razor, MD      . DISCONTD: risperiDONE (RISPERDAL) tablet 0.5 mg  0.5 mg Oral Continuous PRN Raeford Razor, MD      . DISCONTD: ziprasidone (GEODON) injection 10 mg  10 mg Intramuscular Once Raeford Razor, MD       Medications Prior to Admission  Medication Sig Dispense Refill  . buPROPion (WELLBUTRIN SR) 150 MG 12 hr tablet Take 150 mg by mouth 2 (two) times daily.       . colesevelam (WELCHOL) 625 MG tablet Take 1,250 mg by mouth daily.       Marland Kitchen doxepin (SINEQUAN) 25 MG capsule Take 25 mg by mouth at bedtime.       . fluticasone (FLONASE) 50 MCG/ACT nasal spray Place 2 sprays into the nose daily.        Marland Kitchen MILK THISTLE PO Take 3 tablets by mouth daily.          OB/GYN Status:  No LMP recorded. Patient is not currently having periods (Reason: Perimenopausal).  General Assessment Data Living Arrangements: Relatives Can pt return to current living arrangement?: Yes Admission Status: Involuntary Is patient capable of signing voluntary admission?: No Transfer from: Home Referral Source: Self/Family/Friend  Risk to self Suicidal Ideation: No Suicidal Intent: No Is patient at risk for suicide?: No Suicidal Plan?: No Access to Means: No What has been your use of drugs/alcohol within the last 12 months?:  (None Reported ) Other Self Harm Risks:  (Mother reports that she was breaking glass in her room. ) Triggers for Past Attempts: Unpredictable Intentional Self Injurious Behavior: Cutting Comment - Self Injurious Behavior:  (Pt. denies cutting.  IVC petition she was breaking glass. ) Factors that decrease suicide risk: Positive social support Family Suicide History: No Recent stressful life event(s): Conflict (Comment);Other (Comment) Persecutory voices/beliefs?: Yes Depression:  Yes Depression Symptoms: Despondent;Insomnia;Tearfulness;Isolating;Fatigue;Guilt;Loss of interest in usual pleasures;Feeling worthless/self pity;Feeling angry/irritable Substance abuse history and/or treatment for substance abuse?: No Suicide prevention information given to non-admitted patients: Not applicable  Risk to Others Homicidal Ideation: No Thoughts of Harm to Others: No Current Homicidal Intent: No Current Homicidal Plan: No Access to Homicidal Means: No Identified Victim:  (None Reported ) History of harm to others?: Yes Assessment of Violence: On admission Violent Behavior Description:  (Pushed her  mother earlier in the evening. ) Does patient have access to weapons?: No Does patient have a court date: No  Mental Status Report Appear/Hygiene: Disheveled Eye Contact: Poor Motor Activity: Freedom of movement;Unsteady Speech: Soft;Slow Level of Consciousness: Alert;Irritable Mood: Suspicious;Angry;Empty;Irritable Affect: Blunted;Depressed Anxiety Level: None Thought Processes: Relevant Judgement: Impaired Orientation: Person;Place;Time;Situation Obsessive Compulsive Thoughts/Behaviors: None  Cognitive Functioning Concentration:  (Poor ) Memory: Recent Impaired;Remote Impaired IQ: Average Insight: Poor (Poor ) Impulse Control: Poor Appetite: Fair Weight Loss:  (None ) Weight Gain:  (None ) Sleep: No Change Total Hours of Sleep:  (She could not remember ) Vegetative Symptoms: Decreased grooming  Prior Inpatient/Outpatient Therapy Prior Therapy: Inpatient Prior Therapy Dates:  (2010; 2011;2012) Prior Therapy Facilty/Provider(s):  ([facility], [facility]. ) Reason for Treatment:  (Psychosis )            Values / Beliefs Cultural Requests During Hospitalization: None Spiritual Requests During Hospitalization: None        Additional Information 1:1 In Past 12 Months?: No CIRT Risk: No Elopement Risk: No Does patient have medical clearance?: No      Disposition:     On Site Evaluation by:   Reviewed with Physician:     Phillip Heal LaVerne 03/23/2011 4:52 AM

## 2011-03-23 NOTE — ED Notes (Signed)
Crosley MD in at bedside to assess pt

## 2011-03-23 NOTE — ED Notes (Signed)
[facility] lab added to blood drawn already this am.

## 2011-03-23 NOTE — Progress Notes (Signed)
Pt refuses to have ekg done is demanding to see the Dr..  Dr Janee Morn called and informed of refusing of ekg and that she wants to see him.

## 2011-03-23 NOTE — ED Notes (Signed)
Assumed care of pt.  Pt given new scrubs per request.  Also provided cup for urine.  She states she is unable to provide sample at this time.  Pt wrapped her scrub pants around her neck. Was advised to remove them.

## 2011-03-23 NOTE — Progress Notes (Signed)
  Tiffany [name] is a 44 y.o. female [id number] Sep 12, 1965  Subjective/Objective:  Medical records reviewed. Patient sodium is low. Need medical consult/medical admission. I told act person to speak with ED physician.  Filed Vitals:   03/22/11 2149  BP: 129/85  Pulse: 108  Temp: 98.2 F (36.8 C)  Resp: 18    Lab Results:   BMET    Component Value Date/Time   NA 126* 03/22/2011 1552   K 3.3* 03/22/2011 1552   CL 90* 03/22/2011 1552   CO2 23 03/22/2011 1552   GLUCOSE 109* 03/22/2011 1552   BUN <3* 03/22/2011 1552   CREATININE 0.55 03/22/2011 1552   CALCIUM 9.4 03/22/2011 1552   GFRNONAA >90 03/22/2011 1552   GFRAA >90 03/22/2011 1552    Medications:  Scheduled:     . buPROPion  150 mg Oral BID  . colesevelam  1,250 mg Oral Daily  . doxepin  25 mg Oral QHS  . fluticasone  2 spray Each Nare Daily  . LORazepam  1 mg Intramuscular Once  . LORazepam  1 mg Oral BID  . nicotine  21 mg Transdermal Once  . pantoprazole  40 mg Oral Q1200  . ziprasidone  10 mg Intramuscular Once  . DISCONTD: LORazepam  1 mg Intravenous Once  . DISCONTD: LORazepam  1 mg Oral Once  . DISCONTD: ziprasidone  10 mg Intramuscular Once     PRN Meds acetaminophen, alum & mag hydroxide-simeth, ibuprofen, LORazepam, ondansetron, zolpidem, DISCONTD: risperiDONE  Assessment/Plan:   AHLUWALIA,SHAMSHER S MD 03/23/2011

## 2011-03-23 NOTE — ED Provider Notes (Signed)
Pt with mania, ivc paperwork.  Seen by prior team, and was awaiting ACT evaluation.  ACT has seen, and Dr Peggye Pitt has requested medicine consult due to low sodium.  Prior records reviewed, has history of same in the past, as low as 123.  D/w Dr Joneen Roach who accepted medicine consult.  Olivia Mackie, MD 03/23/11 [phone]

## 2011-03-23 NOTE — ED Notes (Signed)
Pt responding to internal stimuli.  Exaggerated hand gestures and body movements.  Given ativan per order to help calm pt.

## 2011-03-23 NOTE — Progress Notes (Addendum)
Subjective: Patient in that psychiatric ED with no complaints. Per nursing staff the patient has refused some of her medications this morning, patient also anxious with some agitation as well with some internal stimulus.  Objective: Vital signs in last 24 hours: Filed Vitals:   03/22/11 1448 03/22/11 1828 03/22/11 2149 03/23/11 0552  BP: 148/99 115/70 129/85 131/83  Pulse: 112 94 108 91  Temp: 97.7 F (36.5 C) 97.8 F (36.6 C) 98.2 F (36.8 C) 98.3 F (36.8 C)  TempSrc: Oral Oral Oral Oral  Resp: 20 15 18 20   SpO2: 96% 96% 95% 96%    Intake/Output Summary (Last 24 hours) at 03/23/11 1128 Last data filed at 03/23/11 0935  Gross per 24 hour  Intake    420 ml  Output      0 ml  Net    420 ml    Weight change:   General: Alert, awake, oriented x3, in no acute distress. HEENT: No bruits, no goiter. Heart: Regular rate and rhythm, without murmurs, rubs, gallops. Lungs: Clear to auscultation bilaterally. Abdomen: Soft, nontender, nondistended, positive bowel sounds. Extremities: No clubbing cyanosis or edema with positive pedal pulses. Neuro: Grossly intact, nonfocal.  Lab Results:  [facility] 03/23/11 0919 03/22/11 1552  NA 129* 126*  K 3.4* 3.3*  CL 94* 90*  CO2 25 23  GLUCOSE 121* 109*  BUN 3* <3*  CREATININE 0.57 0.55  CALCIUM 9.6 9.4  MG -- --  PHOS -- --   No results found for this basename: AST:2,ALT:2,ALKPHOS:2,BILITOT:2,PROT:2,ALBUMIN:2 in the last 72 hours No results found for this basename: LIPASE:2,AMYLASE:2 in the last 72 hours  Basename 03/22/11 1552  WBC 6.0  NEUTROABS --  HGB 12.7  HCT 35.1*  MCV 91.6  PLT 219   No results found for this basename: CKTOTAL:3,CKMB:3,CKMBINDEX:3,TROPONINI:3 in the last 72 hours No results found for this basename: POCBNP:3 in the last 72 hours No results found for this basename: DDIMER:2 in the last 72 hours No results found for this basename: HGBA1C:2 in the last 72 hours No results found for this basename:  CHOL:2,HDL:2,LDLCALC:2,TRIG:2,CHOLHDL:2,LDLDIRECT:2 in the last 72 hours No results found for this basename: TSH,T4TOTAL,FREET3,T3FREE,THYROIDAB in the last 72 hours No results found for this basename: VITAMINB12:2,FOLATE:2,FERRITIN:2,TIBC:2,IRON:2,RETICCTPCT:2 in the last 72 hours  Micro Results: No results found for this or any previous visit (from the past 240 hour(s)).  Studies/Results: No results found.  Medications:     . colesevelam  1,250 mg Oral Daily  . doxepin  25 mg Oral QHS  . fluticasone  2 spray Each Nare Daily  . folic acid  1 mg Oral Daily  . LORazepam  0-4 mg Intravenous Q6H   Followed by  . LORazepam  0-4 mg Intravenous Q12H  . LORazepam  1 mg Intramuscular Once  . LORazepam  1 mg Oral BID  . multivitamins ther. w/minerals  1 tablet Oral Daily  . nicotine  21 mg Transdermal Once  . pantoprazole  40 mg Oral Q1200  . potassium chloride  40 mEq Oral Once  . risperiDONE  1 mg Oral BID  . thiamine  100 mg Oral Daily   Or  . thiamine  100 mg Intravenous Daily  . ziprasidone  10 mg Intramuscular Once  . DISCONTD: buPROPion  150 mg Oral BID  . DISCONTD: LORazepam  1 mg Intravenous Once  . DISCONTD: LORazepam  1 mg Oral Once  . DISCONTD: risperiDONE  0.5 mg Oral BID  . DISCONTD: ziprasidone  10 mg Intramuscular Once  Assessment:/PlanPrincipal Problem:  *Hyponatremia Active Problems:  Hypokalemia  Depression  Anxiety  Tobacco abuse  Alcohol abuse   #1 hyponatremia Likely secondary to beer potomania as patient drinks 12 beers per day. Will check a orthostatics. TSH is pending. Urine sodium is pending urine osmolality is pending. Will check a urine creatinine, will check a serum osmolality, continue fluid restriction as per Dr. Joneen Roach monitor. #2 hypokalemia Check a magnesium level replete. #3 alcohol abuse We'll place on the Ativan CIWA protocol. Place on thiamine,folic acid, and multivitamin. #4 bipolar disorder/depression/anxiety-psychiatry is   consulting  and managing.    LOS: 1 day   Nyoka Alcoser 03/23/2011, 11:28 AM   Time spent greater than .

## 2011-03-23 NOTE — H&P (Signed)
PCP:   Consulting physician; Dr. Norlene Campbell and psychiatry  Reason for consult: Hypo-natremia  HPI: This is a 45 year old female, with chronic alcohol abuse who was brought into the ER for hallucinations. The patient also has a diagnosis of bipolar disease. I'm unable to obtain any history of present illness from patient as she states she was out walking, she came home, she noted a commotion in the house, the police were already there and her mother told the police to take her [the patient] away. She states she does not know why she is at the hospital. She was able to as all of her questions related to past medical history surgical history etc. The hospitalist service was consulted for hyponatremia. The patient denies any weakness, no history of seizures. Per ER physician's note patient is hallucinating. Per patient she does drink significant alcohol daily 12 beers, and she does get with marked withdrawal symptoms including shakes and tremors.she states she's never had a seizure because her symptoms are well controlled with Ativan. She denies a history of liver cirrhosis. By virtue of patient's admission diagnosis she does about the mental status   Review of Systems:  positives bolded  The patient denies anorexia, fever, weight loss,, vision loss, decreased hearing, hoarseness, chest pain, syncope, dyspnea on exertion, peripheral edema, balance deficits, hemoptysis, abdominal pain, melena, hematochezia, severe indigestion/heartburn, hematuria, incontinence, genital sores, muscle weakness, suspicious skin lesions, transient blindness, difficulty walking, depression, unusual weight change, abnormal bleeding, enlarged lymph nodes, angioedema, and breast masses.  Past Medical History: Past Medical History  Diagnosis Date  . Bipolar disorder   . Anxiety   . Depression   . Auditory hallucination   . Hallucination, visual   . Psychiatric hospitalization   . Tubal ectopic pregnancy    Past Surgical History   Procedure Date  . Oophrectomy     Medications: Prior to Admission medications   Medication Sig Start Date End Date Taking? Authorizing Provider  amoxicillin (AMOXIL) 875 MG tablet Take 875 mg by mouth 2 (two) times daily.      Historical Provider, MD  buPROPion (WELLBUTRIN SR) 150 MG 12 hr tablet Take 150 mg by mouth 2 (two) times daily.     Historical Provider, MD  colesevelam (WELCHOL) 625 MG tablet Take 1,250 mg by mouth daily.     Historical Provider, MD  doxepin (SINEQUAN) 25 MG capsule Take 25 mg by mouth at bedtime.     Historical Provider, MD  DULoxetine (CYMBALTA) 60 MG capsule Take 60 mg by mouth daily.      Historical Provider, MD  fluticasone (FLONASE) 50 MCG/ACT nasal spray Place 2 sprays into the nose daily.      Historical Provider, MD  hydrOXYzine (ATARAX/VISTARIL) 50 MG tablet Take 25 mg by mouth every 4 (four) hours as needed.      Historical Provider, MD  ibuprofen (ADVIL,MOTRIN) 600 MG tablet Take 600 mg by mouth every 6 (six) hours as needed.      Historical Provider, MD  lamoTRIgine (LAMICTAL) 100 MG tablet Take 100 mg by mouth daily.      Historical Provider, MD  LORazepam (ATIVAN) 2 MG tablet Take 2 mg by mouth every 6 (six) hours as needed.      Historical Provider, MD  MILK THISTLE PO Take 3 tablets by mouth daily.      Historical Provider, MD  molindone (MOBAN) 10 MG tablet Take 10 mg by mouth at bedtime as needed.      Historical Provider, MD  naproxen (NAPROSYN) 500 MG tablet Take 500 mg by mouth 2 (two) times daily with a meal.      Historical Provider, MD  omeprazole (PRILOSEC) 40 MG capsule Take 40 mg by mouth daily.      Historical Provider, MD  ondansetron (ZOFRAN) 4 MG tablet Take 4 mg by mouth every 8 (eight) hours as needed.      Historical Provider, MD  oxaprozin (DAYPRO) 600 MG tablet Take 600 mg by mouth 2 (two) times daily.      Historical Provider, MD  oxyCODONE-acetaminophen (PERCOCET) 5-325 MG per tablet Take 2 tablets by mouth every 4 (four) hours  as needed. Used for pain     Historical Provider, MD  perphenazine (TRILAFON) 4 MG tablet Take 4 mg by mouth 2 (two) times daily.      Historical Provider, MD  risperiDONE (RISPERDAL) 1 MG tablet Take 1 mg by mouth 2 (two) times daily. Take one tablet by mouth every morning and two tablets every evening at bedtime     Historical Provider, MD  thiamine (VITAMIN B-1) 100 MG tablet Take 100 mg by mouth daily.      Historical Provider, MD  Vitamin D, Ergocalciferol, (DRISDOL) 50000 UNITS CAPS Take 50,000 Units by mouth every 7 (seven) days.      Historical Provider, MD  zonisamide (ZONEGRAN) 100 MG capsule Take 100 mg by mouth daily. Take four tablets by mouth at bedtime     Historical Provider, MD    Allergies:   Allergies  Allergen Reactions  . Sulfa Antibiotics Nausea And Vomiting  . Hydrocodone Other (See Comments)    unknown    Social History:  reports that she has been smoking.  She does not have any smokeless tobacco history on file. She reports that she drinks alcohol. She reports that she uses illicit drugs.  Family History: Family History  Problem Relation Age of Onset  . Hypertension      Physical Exam: Filed Vitals:   03/22/11 1448 03/22/11 1828 03/22/11 2149 03/23/11 0552  BP: 148/99 115/70 129/85 131/83  Pulse: 112 94 108 91  Temp: 97.7 F (36.5 C) 97.8 F (36.6 C) 98.2 F (36.8 C) 98.3 F (36.8 C)  TempSrc: Oral Oral Oral Oral  Resp: 20 15 18 20   SpO2: 96% 96% 95% 96%    General:  Alert and oriented times three, well developed and nourished, no acute distress Eyes: PERRLA, pink conjunctiva, no scleral icterus ENT: Moist oral mucosa, neck supple, no thyromegaly Lungs: clear to ascultation, no wheeze, no crackles, no use of accessory muscles Cardiovascular: regular rate and rhythm, no regurgitation, no gallops, no murmurs. No carotid bruits, no JVD Abdomen: soft, positive BS, non-tender, non-distended, no organomegaly, not an acute abdomen GU: not  examined Neuro: CN II - XII grossly intact, sensation intact Musculoskeletal: strength 5/5 all extremities, no clubbing, cyanosis or edema Skin: no rash, no subcutaneous crepitation, no decubitus Psych: appropriate patient   Labs on Admission:   Basename 03/22/11 1552  NA 126*  K 3.3*  CL 90*  CO2 23  GLUCOSE 109*  BUN <3*  CREATININE 0.55  CALCIUM 9.4  MG --  PHOS --   No results found for this basename: AST:2,ALT:2,ALKPHOS:2,BILITOT:2,PROT:2,ALBUMIN:2 in the last 72 hours No results found for this basename: LIPASE:2,AMYLASE:2 in the last 72 hours  Basename 03/22/11 1552  WBC 6.0  NEUTROABS --  HGB 12.7  HCT 35.1*  MCV 91.6  PLT 219   No results found for this basename:  CKTOTAL:3,CKMB:3,CKMBINDEX:3,TROPONINI:3 in the last 72 hours No results found for this basename: TSH,T4TOTAL,FREET3,T3FREE,THYROIDAB in the last 72 hours No results found for this basename: VITAMINB12:2,FOLATE:2,FERRITIN:2,TIBC:2,IRON:2,RETICCTPCT:2 in the last 72 hours  Radiological Exams on Admission: No results found.  Assessment/Plan Present on Admission:  .Hyponatremia - chronic Ongoing alcohol abuse Patient does have chronic hyponatremia from ongoing alcohol abuse Recommendation fluid restrict to 1.5 L daily Recommend alcohol cessation and CIWA protocol  I will order TSH, urine osmolarity and plasma osmolarity Given the chronicity of the patient's hyponatremia patient is unlikely to be symptomatic at 126.  That being said, some psych medications can affect patient's sodium levels initiate it being admitted for this psych diagnosis, her psych medications will likely be adjusted, I recommend fluid restriction and monitoring of patient's sodium during her admission. Check LFTs No further aggressive interventions needed at this point Monitor sodium twice a week and next facility    Team 3/ Dr. Rolena Infante, Genee Rann 03/23/2011, 6:30 AM

## 2011-03-24 DIAGNOSIS — F29 Unspecified psychosis not due to a substance or known physiological condition: Secondary | ICD-10-CM | POA: Diagnosis present

## 2011-03-24 LAB — BASIC METABOLIC PANEL
CO2: 25 mEq/L (ref 19–32)
Calcium: 9.7 mg/dL (ref 8.4–10.5)
Creatinine, Ser: 0.76 mg/dL (ref 0.50–1.10)
GFR calc non Af Amer: >90 mL/min (ref >90)
Sodium: 134 mEq/L — ABNORMAL LOW (ref 135–145)

## 2011-03-24 LAB — URINE CULTURE
Colony Count: NO GROWTH
Culture  Setup Time: 201212060054

## 2011-03-24 LAB — CREATININE, URINE, RANDOM: Creatinine, Urine: 31.6 mg/dL

## 2011-03-24 LAB — CBC
HCT: 36.3 % (ref 36.0–46.0)
MCHC: 34.4 g/dL (ref 30.0–36.0)
MCV: 96.3 fL (ref 78.0–100.0)
RDW: 12.7 % (ref 11.5–15.5)

## 2011-03-24 MED ORDER — NICOTINE 21 MG/24HR TD PT24
21.0000 mg | MEDICATED_PATCH | Freq: Every day | TRANSDERMAL | Status: DC
  Administered 2011-03-24 – 2011-03-25 (×2): 21 mg via TRANSDERMAL
  Filled 2011-03-24 (×3): qty 1

## 2011-03-24 NOTE — Consult Note (Signed)
Patient Identification:  Tiffany [name] Date of Evaluation:  03/24/2011   History of Present Illness:  45 year old Caucasian female with history of bipolar disorder and alcohol dependence who was noncompliant with her medications and was brought to the ER and then later on admitted on the medical floor because of the low sodium. Patient is very disorganized unable to provide any logical information. She reported that her daughter who is 46 year old had an argument with her father and she don't know why she was brought to the hospital. She told me that daughter is on disability and she pays the father to do some work at home. Patient is responding to inner stimuli.  Unable to obtain past psych history.  Past Medical History:     Past Medical History  Diagnosis Date  . Bipolar disorder   . Anxiety   . Depression   . Auditory hallucination   . Hallucination, visual   . Psychiatric hospitalization   . Tubal ectopic pregnancy   . GERD (gastroesophageal reflux disease)   . Hepatitis C carrier   . Herpes simplex virus infection     history of  . H/O: eczema   . Anxiety   . Alcohol abuse   . Tobacco abuse        Past Surgical History  Procedure Date  . Partial left salpingoectomy 05/07/2004    with resection of ectopic pregnancy  . Wisdom tooth extraction   . Liver biopsy 1998  . Elective abortion 1991    Allergies:  Allergies  Allergen Reactions  . Sulfa Antibiotics Nausea And Vomiting  . Hydrocodone Other (See Comments)    unknown    Current Medications:  Prior to Admission medications   Medication Sig Start Date End Date Taking? Authorizing Provider  amoxicillin (AMOXIL) 875 MG tablet Take 875 mg by mouth 2 (two) times daily.      Historical Provider, MD  buPROPion (WELLBUTRIN SR) 150 MG 12 hr tablet Take 150 mg by mouth 2 (two) times daily.     Historical Provider, MD  colesevelam (WELCHOL) 625 MG tablet Take 1,250 mg by mouth daily.     Historical Provider, MD    doxepin (SINEQUAN) 25 MG capsule Take 25 mg by mouth at bedtime.     Historical Provider, MD  DULoxetine (CYMBALTA) 60 MG capsule Take 60 mg by mouth daily.      Historical Provider, MD  fluticasone (FLONASE) 50 MCG/ACT nasal spray Place 2 sprays into the nose daily.      Historical Provider, MD  hydrOXYzine (ATARAX/VISTARIL) 50 MG tablet Take 25 mg by mouth every 4 (four) hours as needed.      Historical Provider, MD  ibuprofen (ADVIL,MOTRIN) 600 MG tablet Take 600 mg by mouth every 6 (six) hours as needed.      Historical Provider, MD  lamoTRIgine (LAMICTAL) 100 MG tablet Take 100 mg by mouth daily.      Historical Provider, MD  LORazepam (ATIVAN) 2 MG tablet Take 2 mg by mouth every 6 (six) hours as needed.      Historical Provider, MD  MILK THISTLE PO Take 3 tablets by mouth daily.      Historical Provider, MD  molindone (MOBAN) 10 MG tablet Take 10 mg by mouth at bedtime as needed.      Historical Provider, MD  naproxen (NAPROSYN) 500 MG tablet Take 500 mg by mouth 2 (two) times daily with a meal.      Historical Provider, MD  omeprazole (PRILOSEC) 40  MG capsule Take 40 mg by mouth daily.      Historical Provider, MD  ondansetron (ZOFRAN) 4 MG tablet Take 4 mg by mouth every 8 (eight) hours as needed.      Historical Provider, MD  oxaprozin (DAYPRO) 600 MG tablet Take 600 mg by mouth 2 (two) times daily.      Historical Provider, MD  oxyCODONE-acetaminophen (PERCOCET) 5-325 MG per tablet Take 2 tablets by mouth every 4 (four) hours as needed. Used for pain     Historical Provider, MD  perphenazine (TRILAFON) 4 MG tablet Take 4 mg by mouth 2 (two) times daily.      Historical Provider, MD  risperiDONE (RISPERDAL) 1 MG tablet Take 1 mg by mouth 2 (two) times daily. Take one tablet by mouth every morning and two tablets every evening at bedtime     Historical Provider, MD  thiamine (VITAMIN B-1) 100 MG tablet Take 100 mg by mouth daily.      Historical Provider, MD  Vitamin D, Ergocalciferol,  (DRISDOL) 50000 UNITS CAPS Take 50,000 Units by mouth every 7 (seven) days.      Historical Provider, MD  zonisamide (ZONEGRAN) 100 MG capsule Take 100 mg by mouth daily. Take four tablets by mouth at bedtime     Historical Provider, MD    Social History:    reports that she has been smoking Cigarettes.  She has a 30 pack-year smoking history. She has never used smokeless tobacco. She reports that she drinks about 50.4 ounces of alcohol per week. She reports that she uses illicit drugs (Cocaine and Marijuana).   Family History:    Family History  Problem Relation Age of Onset  . Hypertension    . COPD Father     Mental Status Examination/Evaluation: Objective:  Appearance: Disheveled  Psychomotor Activity:  Increased  Eye Contact::  Fair  Speech:  Normal Rate  Volume:  Normal  Mood:  Anxious   Affect:  Restricted  Thought Process:  Disorganized   Orientation:  Place and person but not to time   Thought Content:  Responding to inner stimuli   Suicidal Thoughts:  No  Homicidal Thoughts:  No  Judgement:  Impaired  Insight:  Lacking    DIAGNOSIS:   AXIS I  Psychosis NOS   AXIS II  Deffered  AXIS III See medical notes.  AXIS IV  unspecified   AXIS V 31-40 impairment in reality testing     Recommendations: #1 I started the patient on Risperdal 1 mg twice a day #2 need more collateral information #3 will followup as needed     Eulogio Ditch, MD

## 2011-03-24 NOTE — Progress Notes (Signed)
Subjective: Patient sleeping, with some internal stimulus. Patient states did not get her nicotine patch. Patient states feeling tired.  Objective: Vital signs in last 24 hours: Filed Vitals:   03/23/11 1726 03/23/11 1810 03/23/11 2250 03/24/11 0714  BP:  120/78 132/87 117/79  Pulse:  118 124 88  Temp:  97.7 F (36.5 C) 97.3 F (36.3 C) 98.1 F (36.7 C)  TempSrc:  Oral Oral Oral  Resp:  18 18 20   Height: 5\' 2"  (1.575 m)     Weight: 58.741 kg (129 lb 8 oz)     SpO2:  100% 98% 100%    Intake/Output Summary (Last 24 hours) at 03/24/11 1340 Last data filed at 03/24/11 0900  Gross per 24 hour  Intake    360 ml  Output      0 ml  Net    360 ml    Weight change:   General:Sleeping but easily arousable. Heart: Regular rate and rhythm, without murmurs, rubs, gallops. Lungs: Clear to auscultation bilaterally. Abdomen: Soft, nontender, nondistended, positive bowel sounds. Extremities: No clubbing cyanosis or edema with positive pedal pulses. Neuro: Grossly intact, nonfocal.  Lab Results:  [facility] 03/24/11 0515 03/23/11 0919  NA 134* 129*  K 3.9 3.4*  CL 101 94*  CO2 25 25  GLUCOSE 90 121*  BUN 6 3*  CREATININE 0.76 0.57  CALCIUM 9.7 9.6  MG -- 2.1  PHOS -- --    Basename 03/23/11 0855  AST 110*  ALT 99*  ALKPHOS 104  BILITOT 0.5  PROT 8.1  ALBUMIN 4.0   No results found for this basename: LIPASE:2,AMYLASE:2 in the last 72 hours  Basename 03/24/11 0515 03/22/11 1552  WBC 3.6* 6.0  NEUTROABS -- --  HGB 12.5 12.7  HCT 36.3 35.1*  MCV 96.3 91.6  PLT 215 219   No results found for this basename: CKTOTAL:3,CKMB:3,CKMBINDEX:3,TROPONINI:3 in the last 72 hours No results found for this basename: POCBNP:3 in the last 72 hours No results found for this basename: DDIMER:2 in the last 72 hours No results found for this basename: HGBA1C:2 in the last 72 hours No results found for this basename: CHOL:2,HDL:2,LDLCALC:2,TRIG:2,CHOLHDL:2,LDLDIRECT:2 in the last 72  hours  Basename 03/23/11 0739  TSH 1.956  T4TOTAL --  T3FREE --  THYROIDAB --   No results found for this basename: VITAMINB12:2,FOLATE:2,FERRITIN:2,TIBC:2,IRON:2,RETICCTPCT:2 in the last 72 hours  Micro Results: No results found for this or any previous visit (from the past 240 hour(s)).  Studies/Results: No results found.  Medications:     . colesevelam  1,250 mg Oral Daily  . docusate sodium  100 mg Oral BID  . doxepin  25 mg Oral QHS  . fluticasone  2 spray Each Nare Daily  . folic acid  1 mg Oral Daily  . LORazepam  0-4 mg Intravenous Q6H   Followed by  . LORazepam  0-4 mg Intravenous Q12H  . multivitamins ther. w/minerals  1 tablet Oral Daily  . naproxen  500 mg Oral BID WC  . nicotine  21 mg Transdermal Once  . nicotine  21 mg Transdermal Daily  . oxaprozin  600 mg Oral BID  . pantoprazole  40 mg Oral Q1200  . risperiDONE  1 mg Oral BID  . sodium chloride  3 mL Intravenous Q12H  . thiamine  100 mg Oral Daily   Or  . thiamine  100 mg Intravenous Daily  . DISCONTD: folic acid  1 mg Oral Daily  . DISCONTD: LORazepam  1 mg Oral  BID  . DISCONTD: multivitamins ther. w/minerals  1 tablet Oral Daily  . DISCONTD: thiamine  100 mg Oral Daily    Assessment:/PlanPrincipal Problem:  *Hyponatremia Active Problems:  Psychosis  Hypokalemia  Depression  Anxiety  Tobacco abuse  Alcohol abuse   #1 hyponatremia Likely secondary to beer potomania as patient drinks 12 beers per day. TSH is 1.956. Urine sodium < 15. p urine osmolality is 92.  urine creatinine is 31.6,  serum osmolality = 276. FENA  < 1% Improving, continue fluid restriction. Follow. #2 Psychosis Patient started on resperdal per psychiatry. Will need inpatient psych when bed available. #3 hypokalemia  Repleted. #4 alcohol abuse  No withdrawal sxs. Continue Ativan CIWA protocol. Continue thiamine,folic acid, and multivitamin. #5 bipolar disorder/depression/anxiety-psychiatry is  consulting  and  managing. #6 Dispo- To [facility] when bed available.    LOS: 2 days   THOMPSON,DANIEL 03/24/2011, 1:40 PM   Time spent greater than .

## 2011-03-24 NOTE — Progress Notes (Signed)
Nsl found out pt states It was hurting so bad I don't remember but I took it out.  Refuses to be re stuck at present.

## 2011-03-25 ENCOUNTER — Inpatient Hospital Stay (HOSPITAL_COMMUNITY)
Admission: AD | Admit: 2011-03-25 | Discharge: 2011-03-28 | DRG: 885 | Disposition: A | Discharge status: Home / Self Care | Payer: Medicare Other | Attending: Psychiatry | Admitting: Psychiatry

## 2011-03-25 ENCOUNTER — Encounter (HOSPITAL_COMMUNITY): Payer: Self-pay | Admitting: *Deleted

## 2011-03-25 DIAGNOSIS — Z882 Allergy status to sulfonamides status: Secondary | ICD-10-CM

## 2011-03-25 DIAGNOSIS — F1010 Alcohol abuse, uncomplicated: Secondary | ICD-10-CM | POA: Diagnosis present

## 2011-03-25 DIAGNOSIS — B182 Chronic viral hepatitis C: Secondary | ICD-10-CM

## 2011-03-25 DIAGNOSIS — Z79899 Other long term (current) drug therapy: Secondary | ICD-10-CM

## 2011-03-25 DIAGNOSIS — G8929 Other chronic pain: Secondary | ICD-10-CM

## 2011-03-25 DIAGNOSIS — Z886 Allergy status to analgesic agent status: Secondary | ICD-10-CM

## 2011-03-25 DIAGNOSIS — Z91199 Patient's noncompliance with other medical treatment and regimen due to unspecified reason: Secondary | ICD-10-CM

## 2011-03-25 DIAGNOSIS — F29 Unspecified psychosis not due to a substance or known physiological condition: Secondary | ICD-10-CM | POA: Diagnosis present

## 2011-03-25 DIAGNOSIS — K219 Gastro-esophageal reflux disease without esophagitis: Secondary | ICD-10-CM

## 2011-03-25 DIAGNOSIS — E871 Hypo-osmolality and hyponatremia: Secondary | ICD-10-CM

## 2011-03-25 DIAGNOSIS — Z9119 Patient's noncompliance with other medical treatment and regimen: Secondary | ICD-10-CM

## 2011-03-25 DIAGNOSIS — F3160 Bipolar disorder, current episode mixed, unspecified: Principal | ICD-10-CM

## 2011-03-25 DIAGNOSIS — F200 Paranoid schizophrenia: Secondary | ICD-10-CM

## 2011-03-25 DIAGNOSIS — F411 Generalized anxiety disorder: Secondary | ICD-10-CM

## 2011-03-25 DIAGNOSIS — F17200 Nicotine dependence, unspecified, uncomplicated: Secondary | ICD-10-CM

## 2011-03-25 LAB — BASIC METABOLIC PANEL
BUN: 6 mg/dL (ref 6–23)
Creatinine, Ser: 0.67 mg/dL (ref 0.50–1.10)
GFR calc Af Amer: >90 mL/min (ref >90)
GFR calc non Af Amer: >90 mL/min (ref >90)

## 2011-03-25 MED ORDER — NICOTINE 21 MG/24HR TD PT24: 1.0000 | MEDICATED_PATCH | Freq: Every day | TRANSDERMAL | Status: DC

## 2011-03-25 MED ORDER — THERA M PLUS PO TABS: 1.0000 | ORAL_TABLET | Freq: Every day | ORAL | Status: DC

## 2011-03-25 MED ORDER — ALUM & MAG HYDROXIDE-SIMETH 200-200-20 MG/5ML PO SUSP: 30.0000 mL | ORAL | Status: DC | PRN

## 2011-03-25 MED ORDER — FOLIC ACID 1 MG PO TABS: 1.0000 mg | ORAL_TABLET | Freq: Every day | ORAL | Status: AC

## 2011-03-25 MED ORDER — RISPERIDONE 2 MG PO TABS
2.0000 mg | ORAL_TABLET | Freq: Two times a day (BID) | ORAL | Status: DC
  Filled 2011-03-25 (×3): qty 1

## 2011-03-25 MED ORDER — MAGNESIUM HYDROXIDE 400 MG/5ML PO SUSP: 30.0000 mL | Freq: Every day | ORAL | Status: DC | PRN

## 2011-03-25 MED ORDER — NICOTINE 21 MG/24HR TD PT24
21.0000 mg | MEDICATED_PATCH | Freq: Every day | TRANSDERMAL | Status: DC
  Administered 2011-03-26 – 2011-03-28 (×3): 21 mg via TRANSDERMAL
  Filled 2011-03-25 (×5): qty 1

## 2011-03-25 MED ORDER — ACETAMINOPHEN 325 MG PO TABS: 650.0000 mg | ORAL_TABLET | Freq: Four times a day (QID) | ORAL | Status: DC | PRN

## 2011-03-25 MED ORDER — RISPERIDONE 2 MG PO TABS: 2.0000 mg | ORAL_TABLET | Freq: Two times a day (BID) | ORAL | Status: DC

## 2011-03-25 MED ORDER — POTASSIUM CHLORIDE CRYS ER 20 MEQ PO TBCR
40.0000 meq | EXTENDED_RELEASE_TABLET | Freq: Once | ORAL | Status: AC
  Administered 2011-03-25: 40 meq via ORAL
  Filled 2011-03-25: qty 2

## 2011-03-25 NOTE — Progress Notes (Signed)
  ALIXANDRA ALFIERI is a 45 y.o. female [id number] 07-Apr-1966  Subjective/Objective:  Patient is doing better she is less disorganized less responding to inner stimuli. She denies hearing voices. She denies suicidal or homicidal ideations. She is alert awake oriented x3 her insight and judgment is better I told her that she should take her medications regularly not as needed basis. She also told me about a conflict with the mother.  Filed Vitals:   03/25/11 0612  BP: 133/85  Pulse: 109  Temp: 97.8 F (36.6 C)  Resp: 20    Lab Results:   BMET    Component Value Date/Time   NA 138 03/25/2011 0525   K 3.5 03/25/2011 0525   CL 105 03/25/2011 0525   CO2 26 03/25/2011 0525   GLUCOSE 82 03/25/2011 0525   BUN 6 03/25/2011 0525   CREATININE 0.67 03/25/2011 0525   CALCIUM 9.2 03/25/2011 0525   GFRNONAA >90 03/25/2011 0525   GFRAA >90 03/25/2011 0525    Medications:  Scheduled:     . colesevelam  1,250 mg Oral Daily  . docusate sodium  100 mg Oral BID  . doxepin  25 mg Oral QHS  . fluticasone  2 spray Each Nare Daily  . folic acid  1 mg Oral Daily  . LORazepam  0-4 mg Intravenous Q6H   Followed by  . LORazepam  0-4 mg Intravenous Q12H  . multivitamins ther. w/minerals  1 tablet Oral Daily  . naproxen  500 mg Oral BID WC  . nicotine  21 mg Transdermal Daily  . oxaprozin  600 mg Oral BID  . pantoprazole  40 mg Oral Q1200  . potassium chloride  40 mEq Oral Once  . risperiDONE  1 mg Oral BID  . sodium chloride  3 mL Intravenous Q12H  . thiamine  100 mg Oral Daily   Or  . thiamine  100 mg Intravenous Daily     PRN Meds acetaminophen, acetaminophen, alum & mag hydroxide-simeth, hydrOXYzine, ibuprofen, LORazepam, ondansetron, oxyCODONE-acetaminophen, senna-docusate, sorbitol, zolpidem  Assessment/Plan: #1 we'll increase the Risperdal to 2 mg twice a day #2 patient can be transferred to behavioral health once a bed is available.  Jesslynn Kruck S MD 03/25/2011

## 2011-03-25 NOTE — Progress Notes (Signed)
Subjective: Patient states feels great.  Objective: Vital signs in last 24 hours: Filed Vitals:   03/24/11 1618 03/24/11 2230 03/25/11 0612 03/25/11 1426  BP: 115/74 121/82 133/85 117/73  Pulse: 108 83 109 93  Temp: 98 F (36.7 C) 97.2 F (36.2 C) 97.8 F (36.6 C) 97.8 F (36.6 C)  TempSrc: Oral Oral Oral Oral  Resp: 20 20 20 18   Height:      Weight:      SpO2: 99% 98% 95% 99%    Intake/Output Summary (Last 24 hours) at 03/25/11 1600 Last data filed at 03/25/11 1427  Gross per 24 hour  Intake    960 ml  Output      0 ml  Net    960 ml    Weight change:   General:Alert and oriented x 3.  Heart: Regular rate and rhythm, without murmurs, rubs, gallops. Lungs: Clear to auscultation bilaterally. Abdomen: Soft, nontender, nondistended, positive bowel sounds. Extremities: No clubbing cyanosis or edema with positive pedal pulses. Neuro: Grossly intact, nonfocal.  Lab Results:  Basename 03/25/11 0525 03/24/11 0515 03/23/11 0919  NA 138 134* --  K 3.5 3.9 --  CL 105 101 --  CO2 26 25 --  GLUCOSE 82 90 --  BUN 6 6 --  CREATININE 0.67 0.76 --  CALCIUM 9.2 9.7 --  MG -- -- 2.1  PHOS -- -- --    Basename 03/23/11 0855  AST 110*  ALT 99*  ALKPHOS 104  BILITOT 0.5  PROT 8.1  ALBUMIN 4.0   No results found for this basename: LIPASE:2,AMYLASE:2 in the last 72 hours  Basename 03/24/11 0515  WBC 3.6*  NEUTROABS --  HGB 12.5  HCT 36.3  MCV 96.3  PLT 215   No results found for this basename: CKTOTAL:3,CKMB:3,CKMBINDEX:3,TROPONINI:3 in the last 72 hours No results found for this basename: POCBNP:3 in the last 72 hours No results found for this basename: DDIMER:2 in the last 72 hours No results found for this basename: HGBA1C:2 in the last 72 hours No results found for this basename: CHOL:2,HDL:2,LDLCALC:2,TRIG:2,CHOLHDL:2,LDLDIRECT:2 in the last 72 hours  Basename 03/23/11 0739  TSH 1.956  T4TOTAL --  T3FREE --  THYROIDAB --   No results found for this  basename: VITAMINB12:2,FOLATE:2,FERRITIN:2,TIBC:2,IRON:2,RETICCTPCT:2 in the last 72 hours  Micro Results: Recent Results (from the past 240 hour(s))  URINE CULTURE     Status: Normal   Collection Time   03/23/11  4:02 PM      Component Value Range Status Comment   Specimen Description URINE, RANDOM   Final    Special Requests NONE   Final    Setup Time [id number]   Final    Colony Count NO GROWTH   Final    Culture NO GROWTH   Final    Report Status 03/24/2011 FINAL   Final     Studies/Results: No results found.  Medications:     . colesevelam  1,250 mg Oral Daily  . docusate sodium  100 mg Oral BID  . doxepin  25 mg Oral QHS  . fluticasone  2 spray Each Nare Daily  . folic acid  1 mg Oral Daily  . LORazepam  0-4 mg Intravenous Q6H   Followed by  . LORazepam  0-4 mg Intravenous Q12H  . multivitamins ther. w/minerals  1 tablet Oral Daily  . nicotine  21 mg Transdermal Daily  . oxaprozin  600 mg Oral BID  . pantoprazole  40 mg Oral Q1200  . potassium  chloride  40 mEq Oral Once  . risperiDONE  2 mg Oral BID  . sodium chloride  3 mL Intravenous Q12H  . thiamine  100 mg Oral Daily   Or  . thiamine  100 mg Intravenous Daily  . DISCONTD: naproxen  500 mg Oral BID WC  . DISCONTD: risperiDONE  1 mg Oral BID    Assessment:/PlanPrincipal Problem:  *Hyponatremia Active Problems:  Psychosis  Hypokalemia  Depression  Anxiety  Tobacco abuse  Alcohol abuse   #1 hyponatremia Likely secondary to beer potomania as patient drinks 12 beers per day. TSH is 1.956. Urine sodium < 15. p urine osmolality is 92.  urine creatinine is 31.6,  serum osmolality = 276. FENA  < 1% Improving, continue fluid restriction. Follow. #2 Psychosis  Resperdal increased per psychiatry. Will need inpatient psych when bed available. #3 hypokalemia  Repleted. #4 alcohol abuse  No withdrawal sxs. Continue Ativan CIWA protocol. Continue thiamine,folic acid, and multivitamin. #5 bipolar  disorder/depression/anxiety-psychiatry is  consulting  and managing. #6 Dispo- To [facility] when bed available.    LOS: 3 days   THOMPSON,DANIEL 03/25/2011, 4:00 PM   Time spent greater than .

## 2011-03-25 NOTE — Progress Notes (Signed)
Patient ID: Tiffany [name], female   DOB: [birth year], 45 y.o.   MRN: [id number] Pt. Is a 45 y.o. Caucasian female admitted for AH, with hx. Of ETOH abuse, anxiety, medical hx. Of herpes, hep C. Pt. Is voluntarily admitted. Pt. Reports "I'm here for my mother, to make sure I'm all right". " I have anxiety when it gets severe this time of year, feels like someone is punching me in my diaphragm." Pt. Is preoccupied during admission and restless. Staff will continue to monitor q51min for safety.

## 2011-03-25 NOTE — BH Assessment (Signed)
Assessment Note   LEXIANNA WEINRICH is an 45 y.o. female that was brought to the ER by the [employer]. Her mother executed an IVC due to erratic behaviors. Amaris reports that she has been diagnosed with Bi-Polar Disorder in the past. Daryn reports that she has had multiple hospitalizations and is not able to remember how many times she was hospitalized throughout the years. Melane reports that she was hospitalized due to hearing and seeing voices that were not there. Kayleena reports that she is depressed and does not have any energy. Donicia reports that she resides with her mother and has been withdrawing from her mother and talking more to her hallucinations. Marijane denies any level of aggression. However, the IVC petition reports that she was screaming and pushing her mother. The IVC petition also reports that her 74 year old daughter is afraid of her. Mikyla denies any self-injurious behaviors. Simaya was not able to contract for safety. Naya reports that cannot remember if she has been taking her medication as prescribed. She has a history of bipolar disorder and alcohol dependence who was noncompliant with her medications and was brought to the ER and then later on admitted on the medical floor because of the low sodium. Patient is very disorganized unable to provide any logical information. She reported that her daughter who is 40 year old had an argument with her father and she don't know why she was brought to the hospital. She told me that daughter is on disability and she pays the father to do some work at home. Patient is responding to inner stimuli.          Axis I: Bipolar, Manic Axis II: No diagnosis Axis III:  Past Medical History  Diagnosis Date  . Bipolar disorder   . Anxiety   . Depression   . Auditory hallucination   . Hallucination, visual   . Psychiatric hospitalization   . Tubal ectopic pregnancy   . GERD (gastroesophageal reflux disease)   . Hepatitis C carrier   . Herpes simplex  virus infection     history of  . H/O: eczema   . Anxiety   . Alcohol abuse   . Tobacco abuse    Axis IV: problems with primary support group Axis V: 21-30 behavior considerably influenced by delusions or hallucinations OR serious impairment in judgment, communication OR inability to function in almost all areas  Past Medical History:  Past Medical History  Diagnosis Date  . Bipolar disorder   . Anxiety   . Depression   . Auditory hallucination   . Hallucination, visual   . Psychiatric hospitalization   . Tubal ectopic pregnancy   . GERD (gastroesophageal reflux disease)   . Hepatitis C carrier   . Herpes simplex virus infection     history of  . H/O: eczema   . Anxiety   . Alcohol abuse   . Tobacco abuse     Past Surgical History  Procedure Date  . Partial left salpingoectomy 05/07/2004    with resection of ectopic pregnancy  . Wisdom tooth extraction   . Liver biopsy 1998  . Elective abortion 1991    Family History:  Family History  Problem Relation Age of Onset  . Hypertension    . COPD Father     Social History:  reports that she has been smoking Cigarettes.  She has a 30 pack-year smoking history. She has never used smokeless tobacco. She reports that she drinks about 50.4 ounces of alcohol per  week. She reports that she uses illicit drugs (Cocaine and Marijuana).  Additional Social History:    Allergies:  Allergies  Allergen Reactions  . Sulfa Antibiotics Nausea And Vomiting  . Hydrocodone Other (See Comments)    unknown    Home Medications:  Medications Prior to Admission  Medication Dose Route Frequency Provider Last Rate Last Dose  . acetaminophen (TYLENOL) tablet 650 mg  650 mg Oral Q6H PRN Ramiro Harvest       Or  . acetaminophen (TYLENOL) suppository 650 mg  650 mg Rectal Q6H PRN Ramiro Harvest      . alum & mag hydroxide-simeth (MAALOX/MYLANTA) 200-200-20 MG/5ML suspension 30 mL  30 mL Oral Q6H PRN Ramiro Harvest      . colesevelam  [facility]) tablet 1,250 mg  1,250 mg Oral Daily Raeford Razor, MD   1,250 mg at 03/25/11 1000  . docusate sodium (COLACE) capsule 100 mg  100 mg Oral BID Ramiro Harvest   100 mg at 03/25/11 1000  . doxepin (SINEQUAN) capsule 25 mg  25 mg Oral QHS Raeford Razor, MD   25 mg at 03/24/11 2130  . fluticasone (FLONASE) 50 MCG/ACT nasal spray 2 spray  2 spray Each Nare Daily Raeford Razor, MD   2 spray at 03/25/11 0959  . folic acid (FOLVITE) tablet 1 mg  1 mg Oral Daily Ramiro Harvest   1 mg at 03/25/11 1000  . hydrOXYzine (ATARAX/VISTARIL) tablet 25 mg  25 mg Oral Q6H PRN Ramiro Harvest      . ibuprofen (ADVIL,MOTRIN) tablet 600 mg  600 mg Oral Q6H PRN Ramiro Harvest      . LORazepam (ATIVAN) injection 0-4 mg  0-4 mg Intravenous Q6H Daniel Thompson   1 mg at 03/24/11 1610   Followed by  . LORazepam (ATIVAN) injection 0-4 mg  0-4 mg Intravenous Q12H Ramiro Harvest      . LORazepam (ATIVAN) injection 1 mg  1 mg Intramuscular Once Raeford Razor, MD   1 mg at 03/22/11 1709  . LORazepam (ATIVAN) tablet 1 mg  1 mg Oral Q6H PRN Manson Passey, MD   1 mg at 03/25/11 1151  . multivitamins ther. w/minerals tablet 1 tablet  1 tablet Oral Daily Ramiro Harvest   1 tablet at 03/25/11 1000  . naproxen (NAPROSYN) tablet 500 mg  500 mg Oral BID WC Daniel Thompson   500 mg at 03/25/11 0747  . nicotine (NICODERM CQ - dosed in mg/24 hours) patch 21 mg  21 mg Transdermal Once Raeford Razor, MD   21 mg at 03/22/11 1707  . nicotine (NICODERM CQ - dosed in mg/24 hours) patch 21 mg  21 mg Transdermal Daily Ramiro Harvest   21 mg at 03/25/11 1004  . ondansetron (ZOFRAN) tablet 4 mg  4 mg Oral Q8H PRN Raeford Razor, MD      . oxaprozin (DAYPRO) tablet 600 mg  600 mg Oral BID Ramiro Harvest   600 mg at 03/25/11 1000  . oxyCODONE-acetaminophen (PERCOCET) 5-325 MG per tablet 2 tablet  2 tablet Oral Q4H PRN Ramiro Harvest      . pantoprazole (PROTONIX) EC tablet 40 mg  40 mg Oral Q1200 Raeford Razor, MD   40 mg at 03/25/11 1220   . potassium chloride SA (K-DUR,KLOR-CON) CR tablet 40 mEq  40 mEq Oral Once Ramiro Harvest   40 mEq at 03/23/11 1001  . potassium chloride SA (K-DUR,KLOR-CON) CR tablet 40 mEq  40 mEq Oral Once Ramiro Harvest   40 mEq at 03/25/11  65  . risperiDONE (RISPERDAL) tablet 2 mg  2 mg Oral BID Katheren Puller, MD      . senna-docusate (Senokot-S) tablet 1 tablet  1 tablet Oral QHS PRN Ramiro Harvest      . sodium chloride 0.9 % injection 3 mL  3 mL Intravenous Q12H Daniel Thompson   3 mL at 03/24/11 0913  . sodium phosphate (FLEET) 7-19 GM/118ML enema 1 enema  1 enema Rectal Once PRN Ramiro Harvest      . sorbitol 70 % solution 30 mL  30 mL Oral Daily PRN Ramiro Harvest      . thiamine (VITAMIN B-1) tablet 100 mg  100 mg Oral Daily Daniel Thompson   100 mg at 03/25/11 1000   Or  . thiamine (B-1) injection 100 mg  100 mg Intravenous Daily Ramiro Harvest      . ziprasidone (GEODON) injection 10 mg  10 mg Intramuscular Once Raeford Razor, MD   10 mg at 03/22/11 1707  . zolpidem (AMBIEN) tablet 5 mg  5 mg Oral QHS PRN Raeford Razor, MD   5 mg at 03/24/11 2131  . DISCONTD: acetaminophen (TYLENOL) tablet 650 mg  650 mg Oral Q4H PRN Raeford Razor, MD      . DISCONTD: alum & mag hydroxide-simeth (MAALOX/MYLANTA) 200-200-20 MG/5ML suspension 30 mL  30 mL Oral PRN Raeford Razor, MD      . DISCONTD: buPROPion [facility] SR) 12 hr tablet 150 mg  150 mg Oral BID Raeford Razor, MD      . DISCONTD: folic acid (FOLVITE) tablet 1 mg  1 mg Oral Daily Ramiro Harvest      . DISCONTD: ibuprofen (ADVIL,MOTRIN) tablet 600 mg  600 mg Oral Q8H PRN Raeford Razor, MD      . DISCONTD: LORazepam (ATIVAN) injection 1 mg  1 mg Intravenous Once Raeford Razor, MD      . DISCONTD: LORazepam (ATIVAN) injection 1 mg  1 mg Intravenous Q6H PRN Ramiro Harvest      . DISCONTD: LORazepam (ATIVAN) tablet 1 mg  1 mg Oral Q8H PRN Raeford Razor, MD   1 mg at 03/23/11 0746  . DISCONTD: LORazepam (ATIVAN) tablet 1 mg  1 mg Oral BID  Raeford Razor, MD   1 mg at 03/23/11 2149  . DISCONTD: LORazepam (ATIVAN) tablet 1 mg  1 mg Oral Once Raeford Razor, MD      . DISCONTD: LORazepam (ATIVAN) tablet 1 mg  1 mg Oral Q6H PRN Ramiro Harvest   1 mg at 03/23/11 2206  . DISCONTD: multivitamins ther. w/minerals tablet 1 tablet  1 tablet Oral Daily Ramiro Harvest   1 tablet at 03/23/11 1221  . DISCONTD: risperiDONE (RISPERDAL) tablet 0.5 mg  0.5 mg Oral Continuous PRN Raeford Razor, MD      . DISCONTD: risperiDONE (RISPERDAL) tablet 0.5 mg  0.5 mg Oral BID Katheren Puller, MD      . DISCONTD: risperiDONE (RISPERDAL) tablet 1 mg  1 mg Oral BID Katheren Puller, MD   1 mg at 03/25/11 1000  . DISCONTD: thiamine (VITAMIN B-1) tablet 100 mg  100 mg Oral Daily Ramiro Harvest      . DISCONTD: ziprasidone (GEODON) injection 10 mg  10 mg Intramuscular Once Raeford Razor, MD       Medications Prior to Admission  Medication Sig Dispense Refill  . buPROPion (WELLBUTRIN SR) 150 MG 12 hr tablet Take 150 mg by mouth 2 (two) times daily.       Marland Kitchen  colesevelam (WELCHOL) 625 MG tablet Take 1,250 mg by mouth daily.       Marland Kitchen doxepin (SINEQUAN) 25 MG capsule Take 25 mg by mouth at bedtime.       . fluticasone (FLONASE) 50 MCG/ACT nasal spray Place 2 sprays into the nose daily.        Marland Kitchen MILK THISTLE PO Take 3 tablets by mouth daily.          OB/GYN Status:  No LMP recorded. Patient is not currently having periods (Reason: Perimenopausal).  General Assessment Data Living Arrangements: Parent Can pt return to current living arrangement?: Yes Admission Status: Involuntary Is patient capable of signing voluntary admission?: No Transfer from: Home Referral Source: Self/Family/Friend     Risk to self Suicidal Ideation: No Suicidal Intent: No Is patient at risk for suicide?: No Suicidal Plan?: No Access to Means: No What has been your use of drugs/alcohol within the last 12 months?:  (None Reported ) Other Self Harm Risks:  (Mother reports  that she was breaking glass in her room. ) Triggers for Past Attempts: Unpredictable Intentional Self Injurious Behavior: Cutting Comment - Self Injurious Behavior:  (Pt. denies cutting.  IVC petition she was breaking glass. ) Factors that decrease suicide risk: Positive social support Family Suicide History: No Recent stressful life event(s): Conflict (Comment);Other (Comment) Persecutory voices/beliefs?: Yes Depression: Yes Depression Symptoms: Despondent;Insomnia;Tearfulness;Isolating;Fatigue;Guilt;Loss of interest in usual pleasures;Feeling worthless/self pity;Feeling angry/irritable Substance abuse history and/or treatment for substance abuse?: Yes (Hx of alcohol, marijuana, cocaine abuse) Suicide prevention information given to non-admitted patients: Not applicable  Risk to Others Homicidal Ideation: No Thoughts of Harm to Others: No Current Homicidal Intent: No Current Homicidal Plan: No Access to Homicidal Means: No Identified Victim:  (None Reported ) History of harm to others?: Yes Assessment of Violence: On admission Violent Behavior Description:  (Pushed her mother earlier in the evening. ) Does patient have access to weapons?: No Does patient have a court date: No  Psychosis Hallucinations: Auditory;Visual Delusions: None noted  Mental Status Report Appear/Hygiene: Disheveled Eye Contact: Poor Motor Activity: Freedom of movement;Unsteady Speech: Soft;Slow Level of Consciousness: Alert;Irritable Mood: Suspicious;Angry;Empty;Irritable Affect: Blunted;Depressed Anxiety Level: None Thought Processes: Relevant Judgement: Impaired Orientation: Person;Place;Time;Situation Obsessive Compulsive Thoughts/Behaviors: None  Cognitive Functioning Concentration:  (Poor ) Memory: Recent Impaired;Remote Impaired IQ: Average Insight: Poor (Poor ) Impulse Control: Poor Appetite: Fair Weight Loss:  (None ) Weight Gain:  (None ) Sleep: No Change Total Hours of Sleep:   (She could not remember ) Vegetative Symptoms: Decreased grooming  Prior Inpatient Therapy Prior Inpatient Therapy: Inpatient Prior Therapy Dates:  (2010; 2011;2012) Prior Therapy Facilty/Provider(s):  ([facility], [facility]. ) Reason for Treatment:  (Psychosis )     ADL Screening (condition at time of admission) Patient's cognitive ability adequate to safely complete daily activities?: Yes Patient able to express need for assistance with ADLs?: Yes Independently performs ADLs?: Yes Weakness of Legs: None Weakness of Arms/Hands: None  Home Assistive Devices/Equipment Home Assistive Devices/Equipment: None  Therapy Consults (therapy consults require a physician order) PT Evaluation Needed: No OT Evalulation Needed: No SLP Evaluation Needed: No Abuse/Neglect Assessment (Assessment to be complete while patient is alone) Physical Abuse: Denies Verbal Abuse: Denies Sexual Abuse: Denies Exploitation of patient/patient's resources: Denies Self-Neglect: Denies Values / Beliefs Cultural Requests During Hospitalization: None Spiritual Requests During Hospitalization: None Consults Spiritual Care Consult Needed: No Social Work Consult Needed: No Merchant navy officer (For Healthcare) Advance Directive: Patient does not have advance directive Pre-existing out of facility DNR order (yellow  form or pink MOST form): No Nutrition Screen Diet: Regular Unintentional weight loss greater than 10lbs within the last month: No Dysphagia: No Home Tube Feeding or Total Parenteral Nutrition (TPN): No Patient appears severely malnourished: No Pregnant or Lactating: No Dietitian Consult Needed: No  Additional Information 1:1 In Past 12 Months?: No CIRT Risk: No Elopement Risk: No Does patient have medical clearance?: No     Disposition: Patient is now medically clear and accepted to [facility] for inpatient by Dr. Rogers Blocker to Dr. Allena Katz bed 402.2     On Site Evaluation by:   Reviewed with  Physician:     Lavonia Dana 03/25/2011 2:50 PM

## 2011-03-25 NOTE — Progress Notes (Signed)
Per [facility], bed ready.  Notified RN.  Pt signed consents.  Faxed consents to [facility].  Pt to be d/c'd.  Providence Crosby, LCSWA Clinical Social Work [phone]

## 2011-03-25 NOTE — Discharge Summary (Signed)
Discharge Summary  Tiffany [name] MR#: [id number]  DOB:[birth year]  Date of Admission: 03/22/2011 Date of Discharge: 03/25/2011  Patient's PCP: No primary provider on file.  Attending Physician:THOMPSON,DANIEL  Consults:  Psychiatry-Dr Ahluwalia  Discharge Diagnoses: Hyponatremia Present on Admission:  .Hyponatremia .Hypokalemia .Depression .Anxiety .Tobacco abuse .Alcohol abuse .Psychosis   Brief Admitting History and Physical  This is a 45 year old female, with chronic alcohol abuse who was brought into the ER for hallucinations. The patient also has a diagnosis of bipolar disease. I'm unable to obtain any history of present illness from patient as she states she was out walking, she came home, she noted a commotion in the house, the police were already there and her mother told the police to take her [the patient] away. She states she does not know why she is at the hospital. She was able to as all of her questions related to past medical history surgical history etc. The hospitalist service was consulted for hyponatremia. The patient denies any weakness, no history of seizures. Per ER physician's note patient is hallucinating. Per patient she does drink significant alcohol daily 12 beers, and she does get with marked withdrawal symptoms including shakes and tremors.she states she's never had a seizure because her symptoms are well controlled with Ativan. She denies a history of liver cirrhosis. By virtue of patient's admission diagnosis she does about the mental status    Discharge Medications Current Discharge Medication List    START taking these medications   Details  folic acid (FOLVITE) 1 MG tablet Take 1 tablet (1 mg total) by mouth daily.    Multiple Vitamins-Minerals (MULTIVITAMINS THER. W/MINERALS) TABS Take 1 tablet by mouth daily. Qty: 30 each    nicotine (NICODERM CQ - DOSED IN MG/24 HOURS) 21 mg/24hr patch Place 1 patch (21 patches total) onto the skin  daily. Qty: 28 patch, Refills: 0      CONTINUE these medications which have CHANGED   Details  risperiDONE (RISPERDAL) 2 MG tablet Take 1 tablet (2 mg total) by mouth 2 (two) times daily. Qty: 30 tablet, Refills: 0      CONTINUE these medications which have NOT CHANGED   Details  colesevelam (WELCHOL) 625 MG tablet Take 1,250 mg by mouth daily.     doxepin (SINEQUAN) 25 MG capsule Take 25 mg by mouth at bedtime.     fluticasone (FLONASE) 50 MCG/ACT nasal spray Place 2 sprays into the nose daily.      hydrOXYzine (ATARAX/VISTARIL) 50 MG tablet Take 25 mg by mouth every 4 (four) hours as needed.      LORazepam (ATIVAN) 2 MG tablet Take 2 mg by mouth every 6 (six) hours as needed.      molindone (MOBAN) 10 MG tablet Take 10 mg by mouth at bedtime as needed.      omeprazole (PRILOSEC) 40 MG capsule Take 40 mg by mouth daily.      ondansetron (ZOFRAN) 4 MG tablet Take 4 mg by mouth every 8 (eight) hours as needed.      oxaprozin (DAYPRO) 600 MG tablet Take 600 mg by mouth 2 (two) times daily.      oxyCODONE-acetaminophen (PERCOCET) 5-325 MG per tablet Take 2 tablets by mouth every 4 (four) hours as needed. Used for pain     perphenazine (TRILAFON) 4 MG tablet Take 4 mg by mouth 2 (two) times daily.      thiamine (VITAMIN B-1) 100 MG tablet Take 100 mg by mouth daily.  Vitamin D, Ergocalciferol, (DRISDOL) 50000 UNITS CAPS Take 50,000 Units by mouth every 7 (seven) days.      zonisamide (ZONEGRAN) 100 MG capsule Take 100 mg by mouth daily. Take four tablets by mouth at bedtime       STOP taking these medications     amoxicillin (AMOXIL) 875 MG tablet      buPROPion (WELLBUTRIN SR) 150 MG 12 hr tablet      DULoxetine (CYMBALTA) 60 MG capsule      ibuprofen (ADVIL,MOTRIN) 600 MG tablet      lamoTRIgine (LAMICTAL) 100 MG tablet      MILK THISTLE PO      naproxen (NAPROSYN) 500 MG tablet         Hospital Course: Hyponatremia Patient is a 45yo female with history  of bipolar disorder and alcohol dependence who had presented to the ED with psychosis and found to be hyponatremic. We were asked to admit the patient for her hyponatremia. Patient was admitted and it was felt her hyponatremia was likely secondary to beer potomania. Her admission sodium was 126. Urine studies were obtained as well as osmolality. Patient was placed on fluid restriction of 1.5L/day. Tsh was obtained and was 1.956. Patient was monitored and her hyponatremia improved on a daily basis such that it had resolved by day of discharge and was 138. Patient will be d/c'd in stable condition. Hypokalemia During hospitalization patient was noted to be hypokalemic. Her potasium was repleted and had resolved by day of discharge. Psychosis Patient had presented to the ED with psychosis. Psychiatry consulted on patient, and it was noted that patient was noncompliant with her medications and taking it only on a as needed basis. Patient was started on risperadal per psychiatry, her wellbutrin and lamotrigine was not resumed. A safety sitter was also placed. Patient risperadal dose was increased per psychiatry and she was followed on a daily basis per psychiatry. It was recommended per psychiatry that once patient was medically stable patient will need inpatient treatment at [facility]. Patient is currently medically stable and will be transferred to [facility]. The rest of patient's chronic medical issues remained stable through out the hospitalization.  Present on Admission:  .Hyponatremia .Hypokalemia .Depression .Anxiety .Tobacco abuse .Alcohol abuse .Psychosis   Day of Discharge BP 117/73  Pulse 93  Temp(Src) 97.8 F (36.6 C) (Oral)  Resp 18  Ht 5\' 2"  (1.575 m)  Wt 58.741 kg (129 lb 8 oz)  BMI 23.69 kg/m2  SpO2 99% See progress note. Results for orders placed during the hospital encounter of 03/22/11 (from the past 48 hour(s))  BASIC METABOLIC PANEL     Status: Abnormal   Collection Time   03/24/11   5:15 AM      Component Value Range Comment   Sodium 134 (*) 135 - 145 (mEq/L)    Potassium 3.9  3.5 - 5.1 (mEq/L)    Chloride 101  96 - 112 (mEq/L)    CO2 25  19 - 32 (mEq/L)    Glucose, Bld 90  70 - 99 (mg/dL)    BUN 6  6 - 23 (mg/dL)    Creatinine, Ser 4.09  0.50 - 1.10 (mg/dL)    Calcium 9.7  8.4 - 10.5 (mg/dL)    GFR calc non Af Amer >90  >90 (mL/min)    GFR calc Af Amer >90  >90 (mL/min)   CBC     Status: Abnormal   Collection Time   03/24/11  5:15 AM  Component Value Range Comment   WBC 3.6 (*) 4.0 - 10.5 (K/uL)    RBC 3.77 (*) 3.87 - 5.11 (MIL/uL)    Hemoglobin 12.5  12.0 - 15.0 (g/dL)    HCT 16.1  09.6 - 04.5 (%)    MCV 96.3  78.0 - 100.0 (fL)    MCH 33.2  26.0 - 34.0 (pg)    MCHC 34.4  30.0 - 36.0 (g/dL)    RDW 40.9  81.1 - 91.4 (%)    Platelets 215  150 - 400 (K/uL)   BASIC METABOLIC PANEL     Status: Normal   Collection Time   03/25/11  5:25 AM      Component Value Range Comment   Sodium 138  135 - 145 (mEq/L)    Potassium 3.5  3.5 - 5.1 (mEq/L)    Chloride 105  96 - 112 (mEq/L)    CO2 26  19 - 32 (mEq/L)    Glucose, Bld 82  70 - 99 (mg/dL)    BUN 6  6 - 23 (mg/dL)    Creatinine, Ser 7.82  0.50 - 1.10 (mg/dL)    Calcium 9.2  8.4 - 10.5 (mg/dL)    GFR calc non Af Amer >90  >90 (mL/min)    GFR calc Af Amer >90  >90 (mL/min)     No results found.   Disposition: D/C to [facility]  Diet: regular  Activity: as tolerated.   Follow-up Appts: Discharge Orders    Future Orders Please Complete By Expires   Diet general      Activity as tolerated - No restrictions         TESTS THAT NEED FOLLOW-UP   Time spent on discharge, talking to the patient, and coordinating care: 60 mins.   SignedRamiro Harvest 03/25/2011, 4:57 PM

## 2011-03-25 NOTE — Progress Notes (Signed)
Faxed referral to [facility].  Contacted [facility].  Spoke with Elijah Birk.  Per Elijah Birk, [facility] hasn't been able to review referral due to being inundated with walk-ins.  [facility] to contact [facility] if bed available.  Providence Crosby, LCSWA Clinical Social Work [phone]

## 2011-03-25 NOTE — Progress Notes (Signed)
Hx of MRSA 3 years ago, left ear.

## 2011-03-26 DIAGNOSIS — F3110 Bipolar disorder, current episode manic without psychotic features, unspecified: Secondary | ICD-10-CM

## 2011-03-26 MED ORDER — SENNOSIDES-DOCUSATE SODIUM 8.6-50 MG PO TABS: 1.0000 | ORAL_TABLET | Freq: Every evening | ORAL | Status: DC | PRN

## 2011-03-26 MED ORDER — OXAPROZIN 600 MG PO TABS
600.0000 mg | ORAL_TABLET | Freq: Two times a day (BID) | ORAL | Status: DC
  Administered 2011-03-26 – 2011-03-28 (×5): 600 mg via ORAL
  Filled 2011-03-26 (×7): qty 1

## 2011-03-26 MED ORDER — COLESEVELAM HCL 625 MG PO TABS
1250.0000 mg | ORAL_TABLET | Freq: Every day | ORAL | Status: DC
  Administered 2011-03-26 – 2011-03-28 (×3): 1250 mg via ORAL
  Filled 2011-03-26 (×4): qty 2

## 2011-03-26 MED ORDER — ALUM & MAG HYDROXIDE-SIMETH 200-200-20 MG/5ML PO SUSP: 30.0000 mL | ORAL | Status: DC | PRN

## 2011-03-26 MED ORDER — FOLIC ACID 1 MG PO TABS
1.0000 mg | ORAL_TABLET | Freq: Every day | ORAL | Status: DC
  Administered 2011-03-26 – 2011-03-28 (×3): 1 mg via ORAL
  Filled 2011-03-26 (×4): qty 1

## 2011-03-26 MED ORDER — HYDROXYZINE HCL 25 MG PO TABS
25.0000 mg | ORAL_TABLET | Freq: Four times a day (QID) | ORAL | Status: DC | PRN
  Administered 2011-03-27: 25 mg via ORAL
  Filled 2011-03-26: qty 1

## 2011-03-26 MED ORDER — DOXEPIN HCL 25 MG PO CAPS
25.0000 mg | ORAL_CAPSULE | Freq: Every day | ORAL | Status: DC
  Administered 2011-03-26 – 2011-03-27 (×2): 25 mg via ORAL
  Filled 2011-03-26 (×3): qty 1

## 2011-03-26 MED ORDER — POTASSIUM CHLORIDE CRYS ER 20 MEQ PO TBCR
40.0000 meq | EXTENDED_RELEASE_TABLET | Freq: Once | ORAL | Status: AC
  Administered 2011-03-26: 40 meq via ORAL
  Filled 2011-03-26 (×2): qty 1

## 2011-03-26 MED ORDER — NICOTINE 21 MG/24HR TD PT24
21.0000 mg | MEDICATED_PATCH | Freq: Every day | TRANSDERMAL | Status: DC
  Filled 2011-03-26 (×2): qty 1

## 2011-03-26 MED ORDER — DOCUSATE SODIUM 100 MG PO CAPS
100.0000 mg | ORAL_CAPSULE | Freq: Two times a day (BID) | ORAL | Status: DC
  Administered 2011-03-26 – 2011-03-28 (×5): 100 mg via ORAL
  Filled 2011-03-26 (×7): qty 1

## 2011-03-26 MED ORDER — PANTOPRAZOLE SODIUM 40 MG PO TBEC
40.0000 mg | DELAYED_RELEASE_TABLET | Freq: Every day | ORAL | Status: DC
  Administered 2011-03-26 – 2011-03-28 (×3): 40 mg via ORAL
  Filled 2011-03-26 (×3): qty 1

## 2011-03-26 MED ORDER — FLUTICASONE PROPIONATE 50 MCG/ACT NA SUSP
2.0000 | Freq: Every day | NASAL | Status: DC
  Administered 2011-03-26 – 2011-03-28 (×3): 2 via NASAL
  Filled 2011-03-26: qty 16

## 2011-03-26 MED ORDER — THERA M PLUS PO TABS
1.0000 | ORAL_TABLET | Freq: Every day | ORAL | Status: DC
  Administered 2011-03-26: 1 via ORAL
  Administered 2011-03-27: 09:00:00 via ORAL
  Administered 2011-03-28: 1 via ORAL
  Filled 2011-03-26 (×4): qty 1

## 2011-03-26 MED ORDER — OXYCODONE-ACETAMINOPHEN 5-325 MG PO TABS
2.0000 | ORAL_TABLET | ORAL | Status: DC | PRN
  Administered 2011-03-26 – 2011-03-28 (×6): 2 via ORAL
  Filled 2011-03-26 (×6): qty 2

## 2011-03-26 MED ORDER — ACETAMINOPHEN 325 MG PO TABS: 650.0000 mg | ORAL_TABLET | Freq: Four times a day (QID) | ORAL | Status: DC | PRN

## 2011-03-26 MED ORDER — RISPERIDONE 2 MG PO TABS
2.0000 mg | ORAL_TABLET | Freq: Two times a day (BID) | ORAL | Status: DC
  Administered 2011-03-26 – 2011-03-28 (×5): 2 mg via ORAL
  Filled 2011-03-26 (×5): qty 1
  Filled 2011-03-26: qty 14
  Filled 2011-03-26 (×2): qty 1

## 2011-03-26 MED ORDER — ZOLPIDEM TARTRATE 5 MG PO TABS
5.0000 mg | ORAL_TABLET | Freq: Once | ORAL | Status: AC
  Administered 2011-03-26: 5 mg via ORAL
  Filled 2011-03-26: qty 1

## 2011-03-26 MED ORDER — MAGNESIUM HYDROXIDE 400 MG/5ML PO SUSP: 30.0000 mL | Freq: Every day | ORAL | Status: DC | PRN

## 2011-03-26 NOTE — Progress Notes (Signed)
Suicide Risk Assessment  Admission Assessment     Demographic factors:  Assessment Details Time of Assessment: Admission Current Mental Status:  Current Mental Status:  (denies SHI) Loss Factors:    Historical Factors:  Historical Factors:  (impulsive) Risk Reduction Factors:     CLINICAL FACTORS:   Bipolar Disorder:   Mixed State Alcohol/Substance Abuse/Dependencies Currently Psychotic Unstable or Poor Therapeutic Relationship Previous Psychiatric Diagnoses and Treatments Medical Diagnoses and Treatments/Surgeries  COGNITIVE FEATURES THAT CONTRIBUTE TO RISK:  Thought constriction (tunnel vision)    SUICIDE RISK:   Mild:  Suicidal ideation of limited frequency, intensity, duration, and specificity.  There are no identifiable plans, no associated intent, mild dysphoria and related symptoms, good self-control (both objective and subjective assessment), few other risk factors, and identifiable protective factors, including available and accessible social support.  PLAN OF CARE: Patient seen with physician assistant and chart reviewed. Patient was admitted after relapse into her illness. Patient was medically cleared from emergency department and she was seen by psychiatrist who recommended inpatient treatment. Patient was notice agitated bizarre and having visual hallucination. She has been diagnosed in the past with bipolar disorder and schizophrenia. However she has been noncompliant with her medication. She told she was recommended to take Risperdal as needed. She also admitted drinking alcohol on a regular basis. Patient agreed to restart her medication. We will admit the patient in the unit and restart her medication and encourage her to participate in group milieu therapy. I have explained risks and benefits of medication. We will increase collateral information. She will required inpatient treatment and appropriate discharge planning. Please see history and physical note for further  information  Raimi Guillermo T. 03/26/2011, 1:34 PM

## 2011-03-26 NOTE — Progress Notes (Signed)
Pt was initially anxious and reporting symptoms of choking, irritability and increased anxiety    She is otherwise cooperative   When asked about the burn place which on her arm she said it just appeared there   Pt was also very concerned over why she had not received any of her medications yet    Verbal support given   Discussed medications with md and pa and orders were released   Administered medications and monitor for effectiveness   Q 15 min checks   Pt safe at present

## 2011-03-26 NOTE — Progress Notes (Signed)
[facility] Group Notes:  (Counselor/Nursing/MHT/Case Management/Adjunct)  03/26/2011 3:48 PM  Pt. attended after care planning group and was given SI education and crisis and hotline  Numbers. The pt. agreed to  use them if needed. The pt. spoke about her anxiety  And coming to the hospital. Pt. Did not have any other questions or comments.    Tiffany [name] Poydras 03/26/2011, 3:48 PM

## 2011-03-26 NOTE — H&P (Signed)
Psychiatric Admission Assessment Adult  Patient Identification:  Tiffany [name] Date of Evaluation:  03/26/2011 Chief Complaint:  Bipolar Disorder, Manic History of Present Illness:: Admitted 12/4 12 to main hospital as she was psychotic. Is known to abuse alcohol has been non-compliant and was found to be hyponatremic & hypokalemic. She is admitted to [facility] to reestablish med compliance  She reports prior admissions usually at this time of year.Records show admission in October 2011 but no details. .   Mood Symptoms:  Mood Swings Depression Symptoms:  anxiety and panic attacks (Hypo) Manic Symptoms:   Elevated Mood:  No Irritable Mood:  Yes at admission  Grandiosity:  No Distractibility:  Yes at admission  Labiality of Mood:  No Delusions:  No Hallucinations:  No Impulsivity:  Yes at admission  Sexually Inappropriate Behavior:  No Financial Extravagance:  No Flight of Ideas:  No  Anxiety Symptoms: Excessive Worry:  No Panic Symptoms:  Yes she reports she has this at times  Agoraphobia:  No Obsessive Compulsive: No  Symptoms: None Specific Phobias:  No Social Anxiety:  No  Psychotic Symptoms:  Hallucinations:  None came in psychotic has cleared  Delusions:  No Paranoia:  No   Ideas of Reference:  No  PTSD Symptoms: Ever had a traumatic exposure:  No Had a traumatic exposure in the last month:  No Re-experiencing:  None Hypervigilance:  No Hyperarousal:  None Avoidance:  None  Traumatic Brain Injury:  None reported   Past Psychiatric History: Diagnosis:Bipolar I   Hospitalizations:many   Outpatient Care: none since Dr.Sanders left   Substance Abuse Care:denies but needs for alcohol   Self-Mutilation:none   Suicidal Attempts:denies   Violent Behaviors:none    Past Medical History:   Past Medical History  Diagnosis Date  . Bipolar disorder   . Anxiety   . Depression   . Auditory hallucination   . Hallucination, visual   . Psychiatric hospitalization   .  Tubal ectopic pregnancy   . GERD (gastroesophageal reflux disease)   . Hepatitis C carrier   . Herpes simplex virus infection     history of  . H/O: eczema   . Anxiety   . Alcohol abuse   . Tobacco abuse    History of Loss of Consciousness:  Yes  From alcohol  Seizure History:  No Cardiac History:  No Allergies:   Allergies  Allergen Reactions  . Sulfa Antibiotics Nausea And Vomiting  . Hydrocodone Other (See Comments)    unknown   Current Medications:  Current Facility-Administered Medications  Medication Dose Route Frequency Provider Last Rate Last Dose  . acetaminophen (TYLENOL) tablet 650 mg  650 mg Oral Q6H PRN Franchot Gallo, MD      . acetaminophen (TYLENOL) tablet 650 mg  650 mg Oral Q6H PRN Shamsher Alvie Heidelberg, MD      . alum & mag hydroxide-simeth (MAALOX/MYLANTA) 200-200-20 MG/5ML suspension 30 mL  30 mL Oral Q4H PRN Franchot Gallo, MD      . alum & mag hydroxide-simeth (MAALOX/MYLANTA) 200-200-20 MG/5ML suspension 30 mL  30 mL Oral Q4H PRN Katheren Puller, MD      . colesevelam [facility]) tablet 1,250 mg  1,250 mg Oral Daily Katheren Puller, MD   1,250 mg at 03/26/11 0939  . docusate sodium (COLACE) capsule 100 mg  100 mg Oral BID Katheren Puller, MD   100 mg at 03/26/11 0931  . doxepin (SINEQUAN) capsule 25 mg  25 mg Oral QHS Shamsher S Ahluwalia,  MD      . fluticasone (FLONASE) 50 MCG/ACT nasal spray 2 spray  2 spray Each Nare Daily Katheren Puller, MD   2 spray at 03/26/11 0940  . folic acid (FOLVITE) tablet 1 mg  1 mg Oral Daily Katheren Puller, MD   1 mg at 03/26/11 0940  . hydrOXYzine (ATARAX/VISTARIL) tablet 25 mg  25 mg Oral Q6H PRN Shamsher Alvie Heidelberg, MD      . magnesium hydroxide (MILK OF MAGNESIA) suspension 30 mL  30 mL Oral Daily PRN Franchot Gallo, MD      . magnesium hydroxide (MILK OF MAGNESIA) suspension 30 mL  30 mL Oral Daily PRN Katheren Puller, MD      . multivitamins ther. w/minerals tablet 1 tablet  1 tablet Oral  Daily Katheren Puller, MD   1 tablet at 03/26/11 0931  . nicotine (NICODERM CQ - dosed in mg/24 hours) patch 21 mg  21 mg Transdermal Q0600 Franchot Gallo, MD   21 mg at 03/26/11 0610  . oxaprozin (DAYPRO) tablet 600 mg  600 mg Oral BID Katheren Puller, MD   600 mg at 03/26/11 0958  . oxyCODONE-acetaminophen (PERCOCET) 5-325 MG per tablet 2 tablet  2 tablet Oral Q4H PRN Katheren Puller, MD   2 tablet at 03/26/11 0931  . pantoprazole (PROTONIX) EC tablet 40 mg  40 mg Oral Q1200 Katheren Puller, MD   40 mg at 03/26/11 1202  . potassium chloride SA (K-DUR,KLOR-CON) CR tablet 40 mEq  40 mEq Oral Once Katheren Puller, MD   40 mEq at 03/26/11 0938  . risperiDONE (RISPERDAL) tablet 2 mg  2 mg Oral BID Katheren Puller, MD   2 mg at 03/26/11 0931  . senna-docusate (Senokot-S) tablet 1 tablet  1 tablet Oral QHS PRN Katheren Puller, MD      . zolpidem (AMBIEN) tablet 5 mg  5 mg Oral Once Wonda Cerise, MD   5 mg at 03/26/11 0044  . DISCONTD: nicotine (NICODERM CQ - dosed in mg/24 hours) patch 21 mg  21 mg Transdermal Daily Shamsher Alvie Heidelberg, MD       Facility-Administered Medications Ordered in Other Encounters  Medication Dose Route Frequency Provider Last Rate Last Dose  . DISCONTD: acetaminophen (TYLENOL) suppository 650 mg  650 mg Rectal Q6H PRN Ramiro Harvest      . DISCONTD: acetaminophen (TYLENOL) tablet 650 mg  650 mg Oral Q6H PRN Ramiro Harvest      . DISCONTD: alum & mag hydroxide-simeth (MAALOX/MYLANTA) 200-200-20 MG/5ML suspension 30 mL  30 mL Oral Q6H PRN Ramiro Harvest      . DISCONTD: colesevelam [facility]) tablet 1,250 mg  1,250 mg Oral Daily Raeford Razor, MD   1,250 mg at 03/25/11 1000  . DISCONTD: docusate sodium (COLACE) capsule 100 mg  100 mg Oral BID Ramiro Harvest   100 mg at 03/25/11 1000  . DISCONTD: doxepin (SINEQUAN) capsule 25 mg  25 mg Oral QHS Raeford Razor, MD   25 mg at 03/24/11 2130  . DISCONTD: fluticasone (FLONASE) 50 MCG/ACT nasal  spray 2 spray  2 spray Each Nare Daily Raeford Razor, MD   2 spray at 03/25/11 0959  . DISCONTD: folic acid (FOLVITE) tablet 1 mg  1 mg Oral Daily Ramiro Harvest   1 mg at 03/25/11 1000  . DISCONTD: hydrOXYzine (ATARAX/VISTARIL) tablet 25 mg  25 mg Oral Q6H PRN Ramiro Harvest      . DISCONTD: ibuprofen (ADVIL,MOTRIN) tablet 600  mg  600 mg Oral Q6H PRN Ramiro Harvest      . DISCONTD: LORazepam (ATIVAN) injection 0-4 mg  0-4 mg Intravenous Q12H Ramiro Harvest      . DISCONTD: LORazepam (ATIVAN) tablet 1 mg  1 mg Oral Q6H PRN Manson Passey, MD   1 mg at 03/25/11 2015  . DISCONTD: multivitamins ther. w/minerals tablet 1 tablet  1 tablet Oral Daily Ramiro Harvest   1 tablet at 03/25/11 1000  . DISCONTD: naproxen (NAPROSYN) tablet 500 mg  500 mg Oral BID WC Daniel Thompson   500 mg at 03/25/11 0747  . DISCONTD: nicotine (NICODERM CQ - dosed in mg/24 hours) patch 21 mg  21 mg Transdermal Daily Ramiro Harvest   21 mg at 03/25/11 1004  . DISCONTD: ondansetron (ZOFRAN) tablet 4 mg  4 mg Oral Q8H PRN Raeford Razor, MD      . DISCONTD: oxaprozin (DAYPRO) tablet 600 mg  600 mg Oral BID Ramiro Harvest   600 mg at 03/25/11 1000  . DISCONTD: oxyCODONE-acetaminophen (PERCOCET) 5-325 MG per tablet 2 tablet  2 tablet Oral Q4H PRN Ramiro Harvest      . DISCONTD: pantoprazole (PROTONIX) EC tablet 40 mg  40 mg Oral Q1200 Raeford Razor, MD   40 mg at 03/25/11 1220  . DISCONTD: risperiDONE (RISPERDAL) tablet 2 mg  2 mg Oral BID Katheren Puller, MD      . DISCONTD: senna-docusate (Senokot-S) tablet 1 tablet  1 tablet Oral QHS PRN Ramiro Harvest      . DISCONTD: sodium chloride 0.9 % injection 3 mL  3 mL Intravenous Q12H Daniel Thompson   3 mL at 03/24/11 0913  . DISCONTD: sorbitol 70 % solution 30 mL  30 mL Oral Daily PRN Ramiro Harvest      . DISCONTD: thiamine (B-1) injection 100 mg  100 mg Intravenous Daily Ramiro Harvest      . DISCONTD: thiamine (VITAMIN B-1) tablet 100 mg  100 mg Oral Daily Daniel  Thompson   100 mg at 03/25/11 1000  . DISCONTD: zolpidem (AMBIEN) tablet 5 mg  5 mg Oral QHS PRN Raeford Razor, MD   5 mg at 03/24/11 2131    Previous Psychotropic Medications:  Medication Dose                        Substance Abuse History in the last 12 months: Substance Age of 1st Use Last Use Amount Specific Type  Nicotine      Alcohol      Cannabis      Opiates      Cocaine      Methamphetamines      LSD      Ecstasy      Benzodiazepines      Caffeine      Inhalants      Others:                         Medical Consequences of Substance Abuse:  Legal Consequences of Substance Abuse:  Family Consequences of Substance Abuse:  Blackouts:  Yes DT's:  No Withdrawal Symptoms:  Tremors  Social History: Current Place of Residence:   Place of Birth:   Family Members: Marital Status:  Separated Children:  Sons:  Daughters: 11 today  Relationships: Education:  HS Graduate Educational Problems/Performance: Religious Beliefs/Practices: History of Abuse (Emotional/Phsycial/Sexual) Occupational Experiences; Hotel manager History:  None  Legal History: Hobbies/Interests:  Family History:   Family  History  Problem Relation Age of Onset  . Hypertension    . COPD Father     Mental Status Examination/Evaluation: Objective:  Appearance: Fairly Groomed  Patent attorney::  Good  Speech:  Clear and Coherent  Volume:  Normal  Mood:euthymic     Affect:  Restricted  Thought Process:  Linear  Orientation:  Full  Thought Content:  Denies AVH   Suicidal Thoughts:  No  Homicidal Thoughts:  No  Judgement:  Impaired  Insight:  Fair  Psychomotor Activity:  Normal  Akathisia:  No  Handed:  Right  AIMS (if indicated):     Assets:  Architect Housing Resilience Social Support    Laboratory/X-Ray Psychological Evaluation(s)      Assessment:  Axis I: Bipolar, mixed  AXIS I Alcohol Abuse  AXIS II Deferred  AXIS III Past  Medical History  Diagnosis Date  . Bipolar disorder   . Anxiety   . Depression   . Auditory hallucination   . Hallucination, visual   . Psychiatric hospitalization   . Tubal ectopic pregnancy   . GERD (gastroesophageal reflux disease)   . Hepatitis C carrier   . Herpes simplex virus infection     history of  . H/O: eczema   . Anxiety   . Alcohol abuse   . Tobacco abuse      AXIS IV Chronic alcohol abuse and med non-compliance   AXIS V 31-40 impairment in reality testing   Treatment Plan/Recommendations:  Treatment Plan Summary: Daily contact with patient to assess and evaluate symptoms and progress in treatment Medication management Continue Trilafon 4mg  BID   Observation Level/Precautions:  C.O.  Laboratory:  No extra labs needed   Psychotherapy:  Group   Medications:    Routine PRN Medications:  Yes  Consultations:    Discharge Concerns:  Resume alcohol abuse   Other:     Agree with Physical exam and ROS as on chart from ED and medical unit.  Tiffany [name],Tiffany D. 12/8/20121:59 PM

## 2011-03-26 NOTE — Progress Notes (Signed)
[facility] Group Notes:  (Counselor/Nursing/MHT/Case Management/Adjunct)  03/26/2011 11 AM  Type of Therapy:  Group Therapy, Dance/Movement Therapy   Participation Level:  Active  Participation Quality:  Attentive, Sharing and Supportive  Affect:  Appropriate  Cognitive:  Appropriate  Insight:  Limited  Engagement in Group:  Limited  Engagement in Therapy:  Limited  Modes of Intervention:  Clarification, Problem-solving, Role-play, Socialization and Support  Summary of Progress/Problems: pt spoke about a personal accomplishment of giving her mother a hug and that this was something that was difficult for her. She practiced embodying this positive feeling and remembering the accomplishment. Pt developed a plan to embody this more so as to feel more accomplished and increase her personal feelings of well being now at [facility] and upon D/C.       Gevena Mart

## 2011-03-27 MED ORDER — BENZTROPINE MESYLATE 1 MG PO TABS
1.0000 mg | ORAL_TABLET | Freq: Every day | ORAL | Status: DC
  Administered 2011-03-27: 1 mg via ORAL
  Filled 2011-03-27 (×2): qty 1

## 2011-03-27 MED ORDER — LORAZEPAM 0.5 MG PO TABS
0.5000 mg | ORAL_TABLET | Freq: Four times a day (QID) | ORAL | Status: DC | PRN
  Administered 2011-03-27 – 2011-03-28 (×3): 0.5 mg via ORAL
  Filled 2011-03-27 (×3): qty 1

## 2011-03-27 NOTE — Progress Notes (Signed)
[facility] Group Notes:  (Counselor/Nursing/MHT/Case Management/Adjunct)  03/27/2011 11:13 AM Pt. attended after care planning group  and was given [place] Suicide Prevention Information, crisis and hotline numbers, [facility] information, and information on Therapeutic alternatives located in [place]. Pt.'s in group were encouraged to seek supports in groups, agencies , and others when being discharged from [facility]. Pt. stated she was doing better  And had no questions or concerns.  Lamar Blinks Jeneen 03/27/2011, 11:13 AM

## 2011-03-27 NOTE — Progress Notes (Signed)
Patient ID: Tiffany [name], female   DOB: [birth year], 45 y.o.   MRN: [id number] The patient is more anxious this evening. Stated that she had pain in her neck and back and was not sure if she was feeling anxious due to her pain or if she had pain due to her anxiety. Stated it was very hard for her to relax because the noise level in the dayroom was very high and her roommate sometimes was very friendly towards her but at other times was hostile. Was offered the opportunity to sit in the 500 hall dayroom for an hour, which she enjoyed. Stated she felt less anxious.

## 2011-03-27 NOTE — Progress Notes (Signed)
[facility] Family/Significant Other Suicide Prevention Education  Suicide Prevention Education:  Patient Refusal for Family/Significant Other Suicide Prevention Education: The patient Tiffany [name] has refused to provide written consent for family/significant other to be provided Family/Significant Other Suicide Prevention Education during admission and/or prior to discharge.  Physician notified.  Pt. accepted information on suicide prevention, warning signs to look for with suicide and crisis line numbers to use. The pt. agreed to call crisis line numbers if having warning signs or having thoughts of suicide.    [facility] 03/27/2011, 3:53 PM

## 2011-03-27 NOTE — Progress Notes (Signed)
[facility] MD Progress Note  03/27/2011 9:49 AM  Diagnosis:   Axis I:  Bipolar disorder. Axis II: Deferred Axis III:  Past Medical History  Diagnosis Date  . Bipolar disorder   . Anxiety   . Depression   . Auditory hallucination   . Hallucination, visual   . Psychiatric hospitalization   . Tubal ectopic pregnancy   . GERD (gastroesophageal reflux disease)   . Hepatitis C carrier   . Herpes simplex virus infection     history of  . H/O: eczema   . Anxiety   . Alcohol abuse   . Tobacco abuse    Axis IV: No changes Axis V: 41-50 serious symptoms  ADL's:  Intact  Sleep:  "I slept well" Appetite:  "My appetite is fair"  Suicidal Ideation:   Plan:  No  Intent:  No  Means:  No  Homicidal Ideation:   Plan:  No  Intent:  No  Means:  No   Mental Status: General Appearance /Behavior:  Neat and Casual Eye Contact:  Good Motor Behavior:  Normal Speech:  Normal Level of Consciousness:  Alert Mood:  Anxious Affect:  Appropriate Anxiety Level:  Minimal Thought Process:  Coherent Thought Content:  WNL Perception:  Normal Judgment:  Fair Insight:  Present Cognition:  Orientation time, place and person Sleep:  Number of Hours: 5.25   Vital Signs:Blood pressure 116/80, pulse 101, temperature 98 F (36.7 C), temperature source Oral, resp. rate 18, height 5\' 4"  (1.626 m), weight 134 lb (60.782 kg).  Lab Results: No results found for this or any previous visit (from the past 48 hour(s)).   Treatment Plan Summary: Daily contact with patient to assess and evaluate symptoms and progress in treatment Medication management  Plan: Will continue patient on current treatment plan.           Continue Q 15 minutes checks to maintain safety.           Encourage participation in group therapy and activities.  Armandina Stammer I 03/27/2011, 9:49 AM

## 2011-03-27 NOTE — Progress Notes (Signed)
Patient ID: Tiffany [name], female   DOB: Feb 17, 1966, 45 y.o.   MRN: [id number] The patient was interacting appropriately in the milieu all evening. Pleasant and social with both staff and peers. Somewhat anxious at times, but her thoughts are organized and rational. Denies any auditory/visual hallucinations. Denies any suicidal/homicidal ideation. Compliant with medication. C/o neck pain due to Arthritis.

## 2011-03-27 NOTE — Progress Notes (Signed)
Pt is less anxious this morning   She is spontaneous and denies hallucinations of any kind   Her speech is logical and coherent    Her thoughts are organized   She reports still some mild anxiety and apprehension    Verbal support given  Medications administered and effectiveness monitored   Q 15 min checks   Pt safe at present

## 2011-03-28 DIAGNOSIS — E871 Hypo-osmolality and hyponatremia: Secondary | ICD-10-CM

## 2011-03-28 DIAGNOSIS — F1010 Alcohol abuse, uncomplicated: Secondary | ICD-10-CM | POA: Diagnosis present

## 2011-03-28 DIAGNOSIS — F29 Unspecified psychosis not due to a substance or known physiological condition: Secondary | ICD-10-CM | POA: Diagnosis present

## 2011-03-28 MED ORDER — DSS 100 MG PO CAPS: 100.0000 mg | ORAL_CAPSULE | Freq: Two times a day (BID) | ORAL | Status: AC

## 2011-03-28 MED ORDER — LORAZEPAM 0.5 MG PO TABS: 0.5000 mg | ORAL_TABLET | Freq: Four times a day (QID) | ORAL | Status: AC | PRN

## 2011-03-28 MED ORDER — BENZTROPINE MESYLATE 1 MG PO TABS: 1.0000 mg | ORAL_TABLET | Freq: Every day | ORAL | Status: DC

## 2011-03-28 NOTE — Progress Notes (Signed)
Suicide Risk Assessment  Discharge Assessment     Demographic factors:  Assessment Details Time of Assessment: Admission Current Mental Status:  Current Mental Status:  (denies SHI) Risk Reduction Factors:     CLINICAL FACTORS:   Severe Anxiety and/or Agitation Depression:   Anhedonia Alcohol/Substance Abuse/Dependencies Chronic Pain Previous Psychiatric Diagnoses and Treatments Medical Diagnoses and Treatments/Surgeries  Diagnosis:  Axis I: Schizophrenia - Paranoid Type   The patient was seen today and reports the following:   ADL's: Intact  Sleep: The patient reports to sleeping well last night.  Appetite: The patient reports a good appetite.   Mild>(1-10) >Severe  Hopelessness (1-10): 0  Depression (1-10): 0  Anxiety (1-10): 2-3   Suicidal Ideation: The patient adamantly denies any suicidal ideations today.  Plan: No  Intent: No  Means: No  Homicidal Ideation: The patient adamantly denies any homicidal ideations today.  Plan: No  Intent: No.  Means: No   General Appearance /Behavior: Casual.  Eye Contact: Good.  Speech: Appropriate in rate and volume.  Motor Behavior: WNL.Marland Kitchen  Level of Consciousness: AO x 3.  Mental Status: AO x 3..  Mood: Euthymic today.  Affect: Slightly constricted.  Anxiety Level: Mildly anxiety.  Thought Process: wnl.  Thought Content: The patient denies any auditory or visual hallucinations or delusional thinking. Perception: WNL.  Judgment: Good.  Insight: Good.  Cognition: Orientation time, place and person.   Treatment Plan Summary:  1. Daily contact with patient to assess and evaluate symptoms and progress in treatment  2. Medication management  3. The patient will deny suicidal ideations or homicidal ideations for 48 hours prior to discharge and have a depression and anxiety rating of 3 or less. The patient will also deny any auditory or visual hallucinations or delusional thinking.  Plan:  1. Continue current medications.  2.  Will continue to monitor. 3. Discharge today to outpatient follow-up.   SUICIDE RISK:   Minimal: No identifiable suicidal ideation.  Patients presenting with no risk factors but with morbid ruminations; may be classified as minimal risk based on the severity of the depressive symptoms   Tiffany [name] 03/28/2011, 12:02 PM

## 2011-03-28 NOTE — Progress Notes (Signed)
Recreation Therapy Group Note  Date: 03/28/2011         Time: 0930      Group Topic/Focus: The focus of this group is on enhancing the patient's understanding of leisure, barriers to leisure, and the importance of engaging in positive leisure activities upon discharge for improved total health.  Participation Level: Active  Participation Quality: Appropriate and Sharing  Affect: Appropriate  Cognitive: Oriented   Additional Comments: Patient quiet, but appropriate, reports she will be discharged today. Patient able to identify positive leisure interests and spoke about activities she enjoyed doing with her daughter.  Rockne Dearinger 03/28/2011 11:56 AM

## 2011-03-28 NOTE — Tx Team (Addendum)
Interdisciplinary Treatment Plan Update (Adult)  Date:  03/28/2011  Time Reviewed:  10:09 AM   Progress in Treatment: Attending groups:  Yes Participating in groups:    Yes, somewhat Taking medication as prescribed:  Yes, no refusals     Tolerating medication:   Yes, no side effects reported or noted Family/Significant other contact made:  No, patient refuses Patient understands diagnosis:   Yes Discussing patient identified problems/goals with staff:   Yes Medical problems stabilized or resolved:   Yes Denies suicidal/homicidal ideation:  Yes Issues/concerns per patient self-inventory:   None Other:  New problem(s) identified: No, Describe:    Reason for Continuation of Hospitalization: None  Interventions implemented related to continuation of hospitalization:  Not applicable  Additional comments:  Not applicable  Estimated length of stay:  D/C today  Discharge Plan:  Return home to live with mother, she will come pick up, follow up will be arranged at [facility]  New goal(s):  Not applicable  Review of initial/current patient goals per problem list:   1.  Goal(s):  Psychotic symptoms will return to patient's baseline.  Met:  Yes  Target date:  By Discharge   As evidenced by:  Denies all  2.  Goal(s):  Reduce anxiety from 10 to 3.  Met:  Yes  Target date:  By Discharge   As evidenced by:  At 2-3 today  3.  Goal(s):  Reduce depression from 10 to 3.  Met:  Yes  Target date:  By Discharge   As evidenced by:  At 0 today  4.  Goal(s):  Decide how/if to address substance abuse.  Met:  Yes  Target date:  By Discharge   As evidenced by:  Does not wish to address at this time  Attendees: Patient:  Tiffany [name]  03/28/2011  10:09 AM   Family:     Physician:  Dr. Harvie Heck Readling 03/28/2011  10:09 AM   Nursing:   Robbie Louis, RN 03/28/2011  10:09 AM   Case Manager:  Ambrose Mantle, LCSW 03/28/2011  10:09 AM   Counselor:     Other:   Carolynn Comment, RN 03/28/11   10:09  Other:     Other:     Other:      Scribe for Treatment Team:   Sarina Ser, 03/28/2011, 10:09 AM

## 2011-03-28 NOTE — Progress Notes (Signed)
Patient ID: Tiffany [name], female   DOB: April 13, 1966, 45 y.o.   MRN: [id number] Pt was discharged ambulatory at 1500.  Pt denies SI/HI.  She verbalizes understanding of d/c meds and followup. She was given suicide prevention pamphlet.  She was given scripts and samples by MD.  Pt is hopeful about the future.

## 2011-03-28 NOTE — Discharge Summary (Signed)
Discharge Note  Patient:  Tiffany [name] is an 45 y.o., female DOB:  04-15-1966  Date of Admission:  03/25/2011  Date of Discharge:  03/28/11  Demographic factors:  Assessment Details Time of Assessment: Admission Current Mental Status:  Current Mental Status:  (denies SHI) Risk Reduction Factors:     CLINICAL FACTORS:   Severe Anxiety and/or Agitation Depression:   Anhedonia Alcohol/Substance Abuse/Dependencies Chronic Pain Previous Psychiatric Diagnoses and Treatments Medical Diagnoses and Treatments/Surgeries  Diagnosis:  Axis I: Psychotic Disorder - NOS Alcohol Abuse    The patient was seen today and reports the following:   ADL's: Intact  Sleep: The patient reports to sleeping well last night.  Appetite: The patient reports a good appetite.   Mild>(1-10) >Severe  Hopelessness (1-10): 0  Depression (1-10): 0  Anxiety (1-10): 2-3   Suicidal Ideation: The patient adamantly denies any suicidal ideations today.  Plan: No  Intent: No  Means: No  Homicidal Ideation: The patient adamantly denies any homicidal ideations today.  Plan: No  Intent: No.  Means: No   General Appearance /Behavior: Casual.  Eye Contact: Good.  Speech: Appropriate in rate and volume.  Motor Behavior: WNL.Marland Kitchen  Level of Consciousness: AO x 3.  Mental Status: AO x 3..  Mood: Euthymic today.  Affect: Slightly constricted.  Anxiety Level: Mildly anxiety.  Thought Process: wnl.  Thought Content: The patient denies any auditory or visual hallucinations or delusional thinking. Perception: WNL.  Judgment: Good.  Insight: Good.  Cognition: Orientation time, place and person.   Treatment Plan Summary:  1. Daily contact with patient to assess and evaluate symptoms and progress in treatment  2. Medication management  3. The patient will deny suicidal ideations or homicidal ideations for 48 hours prior to discharge and have a depression and anxiety rating of 3 or less. The patient will also deny  any auditory or visual hallucinations or delusional thinking.   Plan:  1. Continue current medications.  2. Will continue to monitor. 3. Discharge today to outpatient follow-up.   SUICIDE RISK:   Minimal: No identifiable suicidal ideation.  Patients presenting with no risk factors but with morbid ruminations; may be classified as minimal risk based on the severity of the depressive symptoms  Level of Care:  OP  Discharge destination:  Home  Is patient on multiple antipsychotic therapies at discharge:  No    Has Patient had three or more failed trials of antipsychotic monotherapy by history:  No  Patient phone:  [phone] (home)  Patient address:   [address] [place] [address],   Follow-up recommendations:  Other:  Take all medications only as directed and keep all follow up appointments.   [place], [place]  Home Medication Instructions [id number]   Printed on:03/28/11 1226  Medication Information                    doxepin (SINEQUAN) 25 MG capsule Take 25 mg by mouth at bedtime.            colesevelam (WELCHOL) 625 MG tablet Take 1,250 mg by mouth daily.            fluticasone (FLONASE) 50 MCG/ACT nasal spray Place 2 sprays into the nose daily.             hydrOXYzine (ATARAX/VISTARIL) 50 MG tablet Take 25 mg by mouth every 4 (four) hours as needed.             omeprazole (PRILOSEC) 40 MG capsule  Take 40 mg by mouth daily.             oxaprozin (DAYPRO) 600 MG tablet Take 600 mg by mouth 2 (two) times daily.             oxyCODONE-acetaminophen (PERCOCET) 5-325 MG per tablet Take 2 tablets by mouth every 4 (four) hours as needed. Used for pain            Vitamin D, Ergocalciferol, (DRISDOL) 50000 UNITS CAPS Take 50,000 Units by mouth every 7 (seven) days.             folic acid (FOLVITE) 1 MG tablet Take 1 tablet (1 mg total) by mouth daily.           Multiple Vitamins-Minerals (MULTIVITAMINS THER. W/MINERALS) TABS Take 1 tablet by mouth daily.            nicotine (NICODERM CQ - DOSED IN MG/24 HOURS) 21 mg/24hr patch Place 1 patch (21 patches total) onto the skin daily.           risperiDONE (RISPERDAL) 2 MG tablet Take 1 tablet (2 mg total) by mouth 2 (two) times daily.           benztropine (COGENTIN) 1 MG tablet Take 1 tablet (1 mg total) by mouth at bedtime.           docusate sodium 100 MG CAPS Take 100 mg by mouth 2 (two) times daily.           LORazepam (ATIVAN) 0.5 MG tablet Take 1 tablet (0.5 mg total) by mouth every 6 (six) hours as needed for anxiety.            The patient received suicide prevention pamphlet:  Yes  Randy Readling 03/28/2011, 12:20 PM

## 2011-03-28 NOTE — Progress Notes (Signed)
[facility] Case Management Discharge Plan:  Will you be returning to the same living situation after discharge: Yes,  will live with mother and daughter Would you like a referral for services when you are discharged:Yes,  would like to go back to former provider [facility] which has now become [facility] Do you have access to transportation at discharge:Yes,  mother to pick up Do you have the ability to pay for your medications:Yes,  insurance  Interagency Information:   Release of Information completed  Patient to Follow up at:  Follow-up Information    Follow up with [facility] (formerly [facility]) on 03/29/2011. (10:00AM appointment with Olegario Messier for intake)    Contact information:   [address] [place]  [phone]         Patient denies SI/HI:   Yes,      Safety Planning and Suicide Prevention discussed:  Yes,    Barrier to discharge identified:No.  Summary and Recommendations:  There are no remaining barriers to discharge.  Patient describes that they are no problems with 11yo daughter being exposed to arguments/fights with her mother.   Sarina Ser 03/28/2011, 1:33 PM

## 2011-03-29 NOTE — Progress Notes (Signed)
Patient Discharge Instructions:  Admission Note Faxed,  12/11 Discharge Note Faxed,   12/11 After Visit Summary Faxed,  12/11 Faxed to the Next Level Care provider:  [facility]  Reece Agar, 03/29/2011, 3:50 PM

## 2011-04-04 NOTE — Discharge Summary (Signed)
Physician Discharge Summary  Patient ID: Tiffany [name] MRN: [id number] DOB/AGE: [birth year] 45 y.o.  Admit date: 03/25/2011 Discharge date: 03/28/2011  Admission Diagnoses: Axis I: Psychotic Disorder - NOS  Alcohol Abuse   Discharge Diagnoses:  Axis I: Psychotic Disorder - NOS  Alcohol Abuse   Demographic factors: Assessment Details  Time of Assessment: Admission  Current Mental Status: Current Mental Status: (denies SHI)   CLINICAL FACTORS:  Severe Anxiety and/or Agitation  Depression: Anhedonia  Alcohol/Substance Abuse/Dependencies  Chronic Pain  Previous Psychiatric Diagnoses and Treatments  Medical Diagnoses and Treatments/Surgeries   Diagnosis:  Axis I: Psychotic Disorder - NOS  Alcohol Abuse   The patient was seen today and reports the following:   ADL's: Intact  Sleep: The patient reports to sleeping well last night.  Appetite: The patient reports a good appetite.   Mild>(1-10) >Severe  Hopelessness (1-10): 0  Depression (1-10): 0  Anxiety (1-10): 2-3   Suicidal Ideation: The patient adamantly denies any suicidal ideations today.  Plan: No  Intent: No  Means: No   Homicidal Ideation: The patient adamantly denies any homicidal ideations today.  Plan: No  Intent: No.  Means: No   General Appearance /Behavior: Casual.  Eye Contact: Good.  Speech: Appropriate in rate and volume.  Motor Behavior: WNL.Marland Kitchen  Level of Consciousness: AO x 3.  Mental Status: AO x 3..  Mood: Euthymic today.  Affect: Slightly constricted.  Anxiety Level: Mildly anxiety.  Thought Process: wnl.  Thought Content: The patient denies any auditory or visual hallucinations or delusional thinking.  Perception: WNL.  Judgment: Good.  Insight: Good.  Cognition: Orientation time, place and person.   Treatment Plan Summary:  1. Daily contact with patient to assess and evaluate symptoms and progress in treatment  2. Medication management  3. The patient will deny suicidal ideations  or homicidal ideations for 48 hours prior to discharge and have a depression and anxiety rating of 3 or less. The patient will also deny any auditory or visual hallucinations or delusional thinking.   Plan:  1. Continue current medications.  2. Will continue to monitor.  3. Discharge today to outpatient follow-up.   Level of Care: OP  Discharge destination: Home  Is patient on multiple antipsychotic therapies at discharge: No  Has Patient had three or more failed trials of antipsychotic monotherapy by history: No   Patient phone: [phone] (home)  Patient address:  [address]  [place] [address],   Follow-up recommendations: Other: Take all medications only as directed and keep all follow up appointments.   Discharged Condition: The patient denied any significant depressive symptoms and denied any auditory or visual hallucination or delusional thinking.  She did report mild anxiety symptoms but no other concerns.  Hospital Course: Tiffany [name] is a 45 y.o. female that was brought to the ER by the [employer]. Her mother executed an IVC due to erratic behaviors. Tiffany [name] reported that she had been diagnosed with Bi-Polar Disorder in the past. Tiffany [name] reported that she had had multiple hospitalizations and was not able to remember how many times she was hospitalized throughout the years. Tiffany [name] reported that she was hospitalized due to hearing and seeing things that were not there. Tiffany [name] reported that she was depressed and did not have any energy. Tiffany [name] reported that she resided with her mother and had been withdrawing from her mother and talking more to her hallucinations. Tiffany [name] denied any level of aggression. However, the IVC petition reported that she was screaming and  pushing her mother. The IVC petition also reported that her 44 year old daughter was afraid of her. Tiffany [name] denied any self-injurious behaviors. Tiffany [name] was not able to contract for safety. Tiffany [name] reported that she could not remember if  she had been taking her medication as prescribed. She had a history of bipolar disorder and alcohol dependence and was noncompliant with her medications and was brought to the ER and then later on admitted on the medical floor because of the low sodium. Patient was very disorganized unable to provide any logical information.   The patient's medications were adjusted as listed below and she responded well.  At discharge, she denied any significant depressive symptoms and denied any auditory or visual hallucination or delusional thinking.  She did report mild anxiety symptoms but no other concerns.  Consults: None.  Discharge Exam: Blood pressure 107/75, pulse 123, temperature 98.4 F (36.9 C), temperature source Oral, resp. rate 18, height 5\' 4"  (1.626 m), weight 60.782 kg (134 lb).  Disposition: Home or Self Care  Discharge Orders    Future Orders Please Complete By Expires   Diet - low sodium heart healthy      Increase activity slowly      Discharge instructions      Comments:   Continue medications only as prescribed and keep all follow-up appointments.     Medication List  As of 04/04/2011  7:40 PM   START taking these medications         benztropine 1 MG tablet   Commonly known as: COGENTIN   Take 1 tablet (1 mg total) by mouth at bedtime.      DSS 100 MG Caps   Take 100 mg by mouth 2 (two) times daily.         CHANGE how you take these medications         LORazepam 0.5 MG tablet   Commonly known as: ATIVAN   Take 1 tablet (0.5 mg total) by mouth every 6 (six) hours as needed for anxiety.   What changed: - medication strength - dose - reasons to take the med         CONTINUE taking these medications         colesevelam 625 MG tablet   Commonly known as: WELCHOL      doxepin 25 MG capsule   Commonly known as: SINEQUAN      fluticasone 50 MCG/ACT nasal spray   Commonly known as: FLONASE      folic acid 1 MG tablet   Commonly known as: FOLVITE   Take 1  tablet (1 mg total) by mouth daily.      hydrOXYzine 50 MG tablet   Commonly known as: ATARAX/VISTARIL      multivitamins ther. w/minerals Tabs   Take 1 tablet by mouth daily.      nicotine 21 mg/24hr patch   Commonly known as: NICODERM CQ - dosed in mg/24 hours   Place 1 patch (21 patches total) onto the skin daily.      omeprazole 40 MG capsule   Commonly known as: PRILOSEC      oxaprozin 600 MG tablet   Commonly known as: DAYPRO      oxyCODONE-acetaminophen 5-325 MG per tablet   Commonly known as: PERCOCET      risperiDONE 2 MG tablet   Commonly known as: RISPERDAL   Take 1 tablet (2 mg total) by mouth 2 (two) times daily.      Vitamin D (Ergocalciferol)  50000 UNITS Caps   Commonly known as: DRISDOL         STOP taking these medications         molindone 10 MG tablet      ondansetron 4 MG tablet      perphenazine 4 MG tablet      thiamine 100 MG tablet      zonisamide 100 MG capsule          Where to get your medications    These are the prescriptions that you need to pick up.   You may get these medications from any pharmacy.         benztropine 1 MG tablet   DSS 100 MG Caps   LORazepam 0.5 MG tablet           Follow-up Information    Follow up with [facility] (formerly [facility] on 03/29/2011. (9:30AM appointment with Olegario Messier for intake)    Contact information:   [address] [place]  [phone]        SUICIDE RISK:  Minimal: No identifiable suicidal ideation. Patients presenting with no risk factors but with morbid ruminations; may be classified as minimal risk based on the severity of the depressive symptoms   Signed:  Franchot Gallo 04/04/2011, 7:40 PM

## 2011-05-01 ENCOUNTER — Emergency Department (HOSPITAL_COMMUNITY)
Admission: EM | Admit: 2011-05-01 | Discharge: 2011-05-01 | Disposition: A | Discharge status: Other Acute Inpatient Hospital | Payer: Medicare Other | Attending: Emergency Medicine | Admitting: Emergency Medicine

## 2011-05-01 ENCOUNTER — Encounter (HOSPITAL_COMMUNITY): Payer: Self-pay | Admitting: Emergency Medicine

## 2011-05-01 ENCOUNTER — Other Ambulatory Visit: Payer: Self-pay

## 2011-05-01 DIAGNOSIS — B182 Chronic viral hepatitis C: Secondary | ICD-10-CM | POA: Insufficient documentation

## 2011-05-01 DIAGNOSIS — F29 Unspecified psychosis not due to a substance or known physiological condition: Secondary | ICD-10-CM | POA: Insufficient documentation

## 2011-05-01 DIAGNOSIS — F17200 Nicotine dependence, unspecified, uncomplicated: Secondary | ICD-10-CM | POA: Insufficient documentation

## 2011-05-01 HISTORY — DX: Schizophrenia, unspecified: F20.9

## 2011-05-01 LAB — CBC
HCT: 37.4 % (ref 36.0–46.0)
MCV: 92.6 fL (ref 78.0–100.0)
Platelets: 205 10*3/uL (ref 150–400)
RBC: 4.04 MIL/uL (ref 3.87–5.11)
WBC: 6.3 10*3/uL (ref 4.0–10.5)

## 2011-05-01 LAB — POCT PREGNANCY, URINE: Preg Test, Ur: NEGATIVE

## 2011-05-01 LAB — RAPID URINE DRUG SCREEN, HOSP PERFORMED
Amphetamines: NOT DETECTED
Barbiturates: NOT DETECTED
Benzodiazepines: NOT DETECTED
Tetrahydrocannabinol: NOT DETECTED

## 2011-05-01 LAB — COMPREHENSIVE METABOLIC PANEL
AST: 113 U/L — ABNORMAL HIGH (ref 0–37)
Alkaline Phosphatase: 87 U/L (ref 39–117)
CO2: 26 mEq/L (ref 19–32)
Chloride: 93 mEq/L — ABNORMAL LOW (ref 96–112)
Creatinine, Ser: 0.65 mg/dL (ref 0.50–1.10)
GFR calc non Af Amer: >90 mL/min (ref >90)
Potassium: 3.9 mEq/L (ref 3.5–5.1)
Total Bilirubin: 0.7 mg/dL (ref 0.3–1.2)

## 2011-05-01 LAB — URINALYSIS, ROUTINE W REFLEX MICROSCOPIC
Glucose, UA: NEGATIVE mg/dL
Ketones, ur: NEGATIVE mg/dL
Leukocytes, UA: NEGATIVE
Nitrite: NEGATIVE
Protein, ur: NEGATIVE mg/dL
Urobilinogen, UA: 0.2 mg/dL (ref 0.0–1.0)

## 2011-05-01 LAB — ACETAMINOPHEN LEVEL: Acetaminophen (Tylenol), Serum: <15 ug/mL (ref 10–30)

## 2011-05-01 NOTE — ED Provider Notes (Signed)
History     CSN: [id number]  Arrival date & time 05/01/11  1816       Chief Complaint  Patient presents with  . Medical Clearance    The history is provided by the police and medical records. History Limited By: psychosis.  Pt was seen at 2100.  Pt sent with Police from [place] for medical clearance.  Pt under IVC for psychosis, hallucinations, heavy etoh use. Denies any complaints currently.   Past Medical History  Diagnosis Date  . Bipolar disorder   . Anxiety   . Depression   . Auditory hallucination   . Hallucination, visual   . Psychiatric hospitalization   . Tubal ectopic pregnancy   . GERD (gastroesophageal reflux disease)   . Hepatitis C carrier   . Herpes simplex virus infection     history of  . H/O: eczema   . Anxiety   . Alcohol abuse   . Tobacco abuse   . Schizophrenia     Past Surgical History  Procedure Date  . Partial left salpingoectomy 05/07/2004    with resection of ectopic pregnancy  . Wisdom tooth extraction   . Liver biopsy 1998  . Elective abortion 1991    Family History  Problem Relation Age of Onset  . Hypertension    . COPD Father     History  Substance Use Topics  . Smoking status: Current Everyday Smoker -- 1.5 packs/day for 20 years    Types: Cigarettes  . Smokeless tobacco: Never Used  . Alcohol Use: 50.4 oz/week    84 Cans of beer per week     History of drinking 1 six pack of beer daily and 2 six packs on the weekends    Review of Systems  Unable to perform ROS: Psychiatric disorder    Allergies  Sulfa antibiotics and Hydrocodone  Home Medications   Current Outpatient Rx  Name Route Sig Dispense Refill  . DIPHENHYDRAMINE HCL (SLEEP) 25 MG PO TABS Oral Take 50 mg by mouth at bedtime as needed.    Marland Kitchen FLUTICASONE PROPIONATE 50 MCG/ACT NA SUSP Nasal Place 2 sprays into the nose daily.      Marland Kitchen FOLIC ACID 1 MG PO TABS Oral Take 1 tablet (1 mg total) by mouth daily.    Marland Kitchen LORAZEPAM 1 MG PO TABS Oral Take 1 mg by mouth  every 8 (eight) hours. For seizures    . THERA M PLUS PO TABS Oral Take 1 tablet by mouth daily. 30 each   . NICOTINE 21 MG/24HR TD PT24 Transdermal Place 1 patch (21 patches total) onto the skin daily. 28 patch 0  . OMEPRAZOLE 40 MG PO CPDR Oral Take 40 mg by mouth daily.      . OXYCODONE-ACETAMINOPHEN 5-325 MG PO TABS Oral Take 2 tablets by mouth every 4 (four) hours as needed. Used for pain     . VITAMIN D (ERGOCALCIFEROL) 50000 UNITS PO CAPS Oral Take 50,000 Units by mouth every 7 (seven) days. On Monday    . ZOLPIDEM TARTRATE 10 MG PO TABS Oral Take 5 mg by mouth at bedtime as needed. For sleep    . HYDROXYZINE HCL 50 MG PO TABS Oral Take 25 mg by mouth every 4 (four) hours as needed. For itching      BP 110/69  Pulse 92  Temp(Src) 98.3 F (36.8 C) (Oral)  Resp 20  SpO2 99%  Physical Exam 2105: Physical examination:  Nursing notes reviewed; Vital signs and O2 SAT  reviewed;  Constitutional: Well developed, Well nourished, Well hydrated, In no acute distress; Head:  Normocephalic, atraumatic; Eyes: EOMI, PERRL, No scleral icterus; ENMT: Mouth and pharynx normal, Mucous membranes moist; Neck: Supple, Full range of motion, No lymphadenopathy; Cardiovascular: Regular rate and rhythm, No murmur, rub, or gallop; Respiratory: Breath sounds clear & equal bilaterally, No rales, rhonchi, wheezes, or rub, Normal respiratory effort/excursion; Chest: Nontender, Movement normal; Abdomen: Soft, Nontender, Nondistended, Normal bowel sounds; Extremities: Pulses normal, No tenderness, No edema, No calf edema or asymmetry.; Neuro: Awake/alert, confused re: events, Major CN grossly intact. Speech clear, no facial droop.  No gross focal motor deficits in extremities.; Skin: Color normal, Warm, Dry, no rash.  Psych:  Affect flat, tangential and rambling speech.   ED Course  Procedures    MDM  MDM Reviewed: nursing note and vitals Interpretation: ECG and labs    Date: 05/01/2011  Rate: 99  Rhythm:  normal sinus rhythm  QRS Axis: normal  Intervals: normal  ST/T Wave abnormalities: normal  Conduction Disutrbances:none  Narrative Interpretation:   Old EKG Reviewed: none available.  Results for orders placed during the hospital encounter of 05/01/11  CBC      Component Value Range   WBC 6.3  4.0 - 10.5 (K/uL)   RBC 4.04  3.87 - 5.11 (MIL/uL)   Hemoglobin 13.5  12.0 - 15.0 (g/dL)   HCT 69.6  29.5 - 28.4 (%)   MCV 92.6  78.0 - 100.0 (fL)   MCH 33.4  26.0 - 34.0 (pg)   MCHC 36.1 (*) 30.0 - 36.0 (g/dL)   RDW 13.2  44.0 - 10.2 (%)   Platelets 205  150 - 400 (K/uL)  COMPREHENSIVE METABOLIC PANEL      Component Value Range   Sodium 130 (*) 135 - 145 (mEq/L)   Potassium 3.9  3.5 - 5.1 (mEq/L)   Chloride 93 (*) 96 - 112 (mEq/L)   CO2 26  19 - 32 (mEq/L)   Glucose, Bld 137 (*) 70 - 99 (mg/dL)   BUN 5 (*) 6 - 23 (mg/dL)   Creatinine, Ser 7.25  0.50 - 1.10 (mg/dL)   Calcium 36.6  8.4 - 10.5 (mg/dL)   Total Protein 8.3  6.0 - 8.3 (g/dL)   Albumin 4.6  3.5 - 5.2 (g/dL)   AST 440 (*) 0 - 37 (U/L)   ALT 124 (*) 0 - 35 (U/L)   Alkaline Phosphatase 87  39 - 117 (U/L)   Total Bilirubin 0.7  0.3 - 1.2 (mg/dL)   GFR calc non Af Amer >90  >90 (mL/min)   GFR calc Af Amer >90  >90 (mL/min)  ETHANOL      Component Value Range   Alcohol, Ethyl (B) <11  0 - 11 (mg/dL)  ACETAMINOPHEN LEVEL      Component Value Range   Acetaminophen (Tylenol), Serum <15.0  10 - 30 (ug/mL)  URINE RAPID DRUG SCREEN (HOSP PERFORMED)      Component Value Range   Opiates NONE DETECTED  NONE DETECTED    Cocaine NONE DETECTED  NONE DETECTED    Benzodiazepines NONE DETECTED  NONE DETECTED    Amphetamines NONE DETECTED  NONE DETECTED    Tetrahydrocannabinol NONE DETECTED  NONE DETECTED    Barbiturates NONE DETECTED  NONE DETECTED   URINALYSIS, ROUTINE W REFLEX MICROSCOPIC      Component Value Range   Color, Urine YELLOW  YELLOW    APPearance CLEAR  CLEAR    Specific Gravity, Urine 1.007  1.005 - 1.030    pH 7.5   5.0 - 8.0    Glucose, UA NEGATIVE  NEGATIVE (mg/dL)   Hgb urine dipstick NEGATIVE  NEGATIVE    Bilirubin Urine NEGATIVE  NEGATIVE    Ketones, ur NEGATIVE  NEGATIVE (mg/dL)   Protein, ur NEGATIVE  NEGATIVE (mg/dL)   Urobilinogen, UA 0.2  0.0 - 1.0 (mg/dL)   Nitrite NEGATIVE  NEGATIVE    Leukocytes, UA NEGATIVE  NEGATIVE   POCT PREGNANCY, URINE      Component Value Range   Preg Test, Ur NEGATIVE      Results for GWENDALYN, MCGONAGLE (MRN [id number]) as of 05/01/2011 22:10  Ref. Range 10/12/2009 09:35 11/20/2009 19:44 01/17/2010 15:26 03/23/2011 08:55 05/01/2011 19:09  AST Latest Range: 0-37 U/L 64 (H) 65 (H) 64 (H) 110 (H) 113 (H)  ALT Latest Range: 0-35 U/L 59 (H) 55 (H) 42 (H) 99 (H) 124 (H)     10:27 PM:   Hx heavy etoh abuse, LFT's chronically elevated.  Vesta Mixer will take pt back.  Pt under IVC, Police at bedside and will take her directly there to Dr. Threasa Beards (accepting MD).          Halynn Reitano Allison Quarry, DO 05/02/11 1654

## 2011-05-01 NOTE — ED Notes (Signed)
Sent from [facility] with IVC papers requesting detox and SI. Has bed at [facility], to go back after med clearance

## 2011-11-08 ENCOUNTER — Other Ambulatory Visit: Payer: Self-pay | Admitting: Obstetrics and Gynecology

## 2011-11-08 ENCOUNTER — Other Ambulatory Visit (HOSPITAL_COMMUNITY)
Admission: RE | Admit: 2011-11-08 | Discharge: 2011-11-08 | Disposition: A | Discharge status: Home / Self Care | Payer: Medicare Other | Source: Ambulatory Visit | Attending: Obstetrics and Gynecology | Admitting: Obstetrics and Gynecology

## 2011-11-08 DIAGNOSIS — Z124 Encounter for screening for malignant neoplasm of cervix: Secondary | ICD-10-CM | POA: Insufficient documentation

## 2011-11-16 ENCOUNTER — Other Ambulatory Visit: Payer: Self-pay | Admitting: Obstetrics and Gynecology

## 2011-11-16 DIAGNOSIS — Z1231 Encounter for screening mammogram for malignant neoplasm of breast: Secondary | ICD-10-CM

## 2011-11-18 ENCOUNTER — Ambulatory Visit (HOSPITAL_BASED_OUTPATIENT_CLINIC_OR_DEPARTMENT_OTHER): Payer: Medicare Other

## 2012-11-07 ENCOUNTER — Other Ambulatory Visit: Payer: Self-pay | Admitting: Internal Medicine

## 2012-11-07 DIAGNOSIS — B182 Chronic viral hepatitis C: Secondary | ICD-10-CM

## 2012-11-19 ENCOUNTER — Other Ambulatory Visit: Payer: Medicare Other

## 2012-11-23 ENCOUNTER — Ambulatory Visit
Admission: RE | Admit: 2012-11-23 | Discharge: 2012-11-23 | Disposition: A | Discharge status: Home / Self Care | Payer: Medicare Other | Source: Ambulatory Visit | Attending: Internal Medicine | Admitting: Internal Medicine

## 2012-11-23 DIAGNOSIS — B182 Chronic viral hepatitis C: Secondary | ICD-10-CM

## 2013-01-08 ENCOUNTER — Inpatient Hospital Stay (HOSPITAL_BASED_OUTPATIENT_CLINIC_OR_DEPARTMENT_OTHER)
Admission: EM | Admit: 2013-01-08 | Discharge: 2013-01-09 | DRG: 641 | Disposition: A | Discharge status: Home / Self Care | Payer: Medicare Other | Attending: Internal Medicine | Admitting: Internal Medicine

## 2013-01-08 ENCOUNTER — Encounter (HOSPITAL_BASED_OUTPATIENT_CLINIC_OR_DEPARTMENT_OTHER): Payer: Self-pay | Admitting: Emergency Medicine

## 2013-01-08 DIAGNOSIS — K219 Gastro-esophageal reflux disease without esophagitis: Secondary | ICD-10-CM | POA: Diagnosis present

## 2013-01-08 DIAGNOSIS — F319 Bipolar disorder, unspecified: Secondary | ICD-10-CM | POA: Diagnosis present

## 2013-01-08 DIAGNOSIS — Z79899 Other long term (current) drug therapy: Secondary | ICD-10-CM

## 2013-01-08 DIAGNOSIS — R632 Polyphagia: Secondary | ICD-10-CM | POA: Diagnosis present

## 2013-01-08 DIAGNOSIS — F411 Generalized anxiety disorder: Secondary | ICD-10-CM | POA: Diagnosis present

## 2013-01-08 DIAGNOSIS — B1920 Unspecified viral hepatitis C without hepatic coma: Secondary | ICD-10-CM | POA: Diagnosis present

## 2013-01-08 DIAGNOSIS — F32A Depression, unspecified: Secondary | ICD-10-CM | POA: Diagnosis present

## 2013-01-08 DIAGNOSIS — F419 Anxiety disorder, unspecified: Secondary | ICD-10-CM

## 2013-01-08 DIAGNOSIS — R631 Polydipsia: Secondary | ICD-10-CM | POA: Diagnosis present

## 2013-01-08 DIAGNOSIS — E871 Hypo-osmolality and hyponatremia: Principal | ICD-10-CM | POA: Diagnosis present

## 2013-01-08 DIAGNOSIS — Z720 Tobacco use: Secondary | ICD-10-CM | POA: Diagnosis present

## 2013-01-08 DIAGNOSIS — F17200 Nicotine dependence, unspecified, uncomplicated: Secondary | ICD-10-CM | POA: Diagnosis present

## 2013-01-08 DIAGNOSIS — F209 Schizophrenia, unspecified: Secondary | ICD-10-CM | POA: Diagnosis present

## 2013-01-08 DIAGNOSIS — F329 Major depressive disorder, single episode, unspecified: Secondary | ICD-10-CM | POA: Diagnosis present

## 2013-01-08 LAB — URINALYSIS, ROUTINE W REFLEX MICROSCOPIC
Bilirubin Urine: NEGATIVE
Ketones, ur: NEGATIVE mg/dL
Nitrite: NEGATIVE
Protein, ur: NEGATIVE mg/dL
pH: 7 (ref 5.0–8.0)

## 2013-01-08 LAB — URINE MICROSCOPIC-ADD ON

## 2013-01-08 LAB — BASIC METABOLIC PANEL
BUN: 3 mg/dL — ABNORMAL LOW (ref 6–23)
Calcium: 9.3 mg/dL (ref 8.4–10.5)
Creatinine, Ser: 0.7 mg/dL (ref 0.50–1.10)
GFR calc Af Amer: >90 mL/min (ref >90)
GFR calc non Af Amer: >90 mL/min (ref >90)

## 2013-01-08 MED ORDER — SOFOSBUVIR 400 MG PO TABS: 400.0000 mg | ORAL_TABLET | Freq: Every day | ORAL | Status: DC

## 2013-01-08 MED ORDER — PERPHENAZINE 2 MG PO TABS
2.0000 mg | ORAL_TABLET | Freq: Every day | ORAL | Status: DC
  Administered 2013-01-09: 2 mg via ORAL
  Filled 2013-01-08: qty 1

## 2013-01-08 MED ORDER — LORAZEPAM 1 MG PO TABS
1.0000 mg | ORAL_TABLET | Freq: Three times a day (TID) | ORAL | Status: DC
  Administered 2013-01-08 – 2013-01-09 (×3): 1 mg via ORAL
  Filled 2013-01-08 (×3): qty 1

## 2013-01-08 MED ORDER — PERPHENAZINE 2 MG PO TABS
2.0000 mg | ORAL_TABLET | Freq: Two times a day (BID) | ORAL | Status: DC
  Filled 2013-01-08: qty 1

## 2013-01-08 MED ORDER — ALUM & MAG HYDROXIDE-SIMETH 200-200-20 MG/5ML PO SUSP: 30.0000 mL | Freq: Four times a day (QID) | ORAL | Status: DC | PRN

## 2013-01-08 MED ORDER — ZOLPIDEM TARTRATE 5 MG PO TABS: 5.0000 mg | ORAL_TABLET | Freq: Every evening | ORAL | Status: DC | PRN

## 2013-01-08 MED ORDER — SODIUM CHLORIDE 0.9 % IV SOLN
Freq: Once | INTRAVENOUS | Status: AC
  Administered 2013-01-08: 17:00:00 via INTRAVENOUS

## 2013-01-08 MED ORDER — TRAZODONE HCL 150 MG PO TABS
150.0000 mg | ORAL_TABLET | Freq: Every day | ORAL | Status: DC
  Administered 2013-01-08: 150 mg via ORAL
  Filled 2013-01-08 (×2): qty 1

## 2013-01-08 MED ORDER — HEPARIN SODIUM (PORCINE) 5000 UNIT/ML IJ SOLN
5000.0000 [IU] | Freq: Three times a day (TID) | INTRAMUSCULAR | Status: DC
  Administered 2013-01-08 – 2013-01-09 (×2): 5000 [IU] via SUBCUTANEOUS
  Filled 2013-01-08 (×5): qty 1

## 2013-01-08 MED ORDER — SODIUM CHLORIDE 0.9 % IV SOLN: INTRAVENOUS | Status: DC

## 2013-01-08 MED ORDER — BUPROPION HCL ER (XL) 300 MG PO TB24
450.0000 mg | ORAL_TABLET | Freq: Every day | ORAL | Status: DC
  Administered 2013-01-09: 10:00:00 450 mg via ORAL
  Filled 2013-01-08: qty 1

## 2013-01-08 MED ORDER — OXYCODONE HCL 5 MG PO TABS
5.0000 mg | ORAL_TABLET | ORAL | Status: DC | PRN
  Administered 2013-01-08 – 2013-01-09 (×2): 5 mg via ORAL
  Filled 2013-01-08 (×2): qty 1

## 2013-01-08 MED ORDER — PERPHENAZINE 8 MG PO TABS
8.0000 mg | ORAL_TABLET | Freq: Every day | ORAL | Status: DC
  Administered 2013-01-08: 8 mg via ORAL
  Filled 2013-01-08 (×2): qty 1

## 2013-01-08 MED ORDER — BUPROPION HCL ER (SR) 150 MG PO TB12: 450.0000 mg | ORAL_TABLET | Freq: Every day | ORAL | Status: DC

## 2013-01-08 MED ORDER — FLUTICASONE PROPIONATE 50 MCG/ACT NA SUSP
2.0000 | Freq: Every day | NASAL | Status: DC
  Administered 2013-01-09: 2 via NASAL
  Filled 2013-01-08: qty 16

## 2013-01-08 MED ORDER — SODIUM CHLORIDE 0.9 % IV SOLN: Freq: Once | INTRAVENOUS | Status: DC

## 2013-01-08 MED ORDER — RIBAVIRIN 200 MG PO CAPS
600.0000 mg | ORAL_CAPSULE | Freq: Two times a day (BID) | ORAL | Status: DC
  Administered 2013-01-09: 600 mg via ORAL
  Filled 2013-01-08 (×3): qty 3

## 2013-01-08 MED ORDER — PANTOPRAZOLE SODIUM 40 MG PO TBEC
40.0000 mg | DELAYED_RELEASE_TABLET | Freq: Every day | ORAL | Status: DC
  Administered 2013-01-09: 40 mg via ORAL
  Filled 2013-01-08: qty 1

## 2013-01-08 MED ORDER — ATOMOXETINE HCL 40 MG PO CAPS
80.0000 mg | ORAL_CAPSULE | Freq: Every day | ORAL | Status: DC
  Administered 2013-01-09: 80 mg via ORAL
  Filled 2013-01-08: qty 2

## 2013-01-08 MED ORDER — NICOTINE 21 MG/24HR TD PT24
21.0000 mg | MEDICATED_PATCH | Freq: Every day | TRANSDERMAL | Status: DC
  Administered 2013-01-08 – 2013-01-09 (×2): 21 mg via TRANSDERMAL
  Filled 2013-01-08 (×2): qty 1

## 2013-01-08 MED ORDER — ADULT MULTIVITAMIN W/MINERALS CH
1.0000 | ORAL_TABLET | Freq: Every day | ORAL | Status: DC
  Administered 2013-01-08 – 2013-01-09 (×2): 1 via ORAL
  Filled 2013-01-08 (×2): qty 1

## 2013-01-08 MED ORDER — BISACODYL 10 MG RE SUPP: 10.0000 mg | Freq: Every day | RECTAL | Status: DC | PRN

## 2013-01-08 NOTE — ED Provider Notes (Signed)
CSN: [id number]     Arrival date & time 01/08/13  1500 History   First MD Initiated Contact with Patient 01/08/13 1547     Chief Complaint  Patient presents with  . Abnormal Lab   (Consider location/radiation/quality/duration/timing/severity/associated sxs/prior Treatment) HPI Comments: 47 year-old female who has a relatively new diagnosis of hepatitis C presents with hyponatremia. She was started on ribavirin 2 months ago. She was told to increase her fluid intake and toes thus been taking 1 gallon of water a day. Before this she says she or he rarely drank water. Her GI doctor sent her over here because routine lab work showed a sodium level of 127. Patient does state she's been having a little more confusion than normal and has trouble remembering what she is doing over the past few days. She's also felt muscle cramps and maybe muscle weakness over the past few days as well. Denies any headaches, chest pain, or abdominal pain. No seizures.  The history is provided by the patient.    Past Medical History  Diagnosis Date  . Bipolar disorder   . Anxiety   . Depression   . Auditory hallucination   . Hallucination, visual   . Psychiatric hospitalization   . Tubal ectopic pregnancy   . GERD (gastroesophageal reflux disease)   . Hepatitis C carrier   . Herpes simplex virus infection     history of  . H/O: eczema   . Anxiety   . Alcohol abuse   . Tobacco abuse   . Schizophrenia    Past Surgical History  Procedure Laterality Date  . Partial left salpingoectomy  05/07/2004    with resection of ectopic pregnancy  . Wisdom tooth extraction    . Liver biopsy  1998  . Elective abortion  1991   Family History  Problem Relation Age of Onset  . Hypertension    . COPD Father    History  Substance Use Topics  . Smoking status: Current Every Day Smoker -- 1.50 packs/day for 20 years    Types: Cigarettes  . Smokeless tobacco: Never Used  . Alcohol Use: No     Comment: History of  drinking 1 six pack of beer daily and 2 six packs on the weekends   OB History   Grav Para Term Preterm Abortions TAB SAB Ect Mult Living                 Review of Systems  Respiratory: Negative for shortness of breath.   Cardiovascular: Negative for chest pain.  Gastrointestinal: Negative for nausea and vomiting.  Endocrine: Positive for polyuria.  Genitourinary: Negative for dysuria and decreased urine volume.  Musculoskeletal: Positive for myalgias.  Neurological: Positive for weakness (Generalized). Negative for headaches.  Psychiatric/Behavioral: Positive for confusion (intermittently).  All other systems reviewed and are negative.    Allergies  Sulfa antibiotics and Hydrocodone  Home Medications   Current Outpatient Rx  Name  Route  Sig  Dispense  Refill  . atomoxetine (STRATTERA) 80 MG capsule   Oral   Take 80 mg by mouth daily.         Marland Kitchen buPROPion (WELLBUTRIN SR) 150 MG 12 hr tablet   Oral   Take 450 mg by mouth daily.         . citalopram (CELEXA) 40 MG tablet   Oral   Take 40 mg by mouth daily.         . naproxen (NAPROSYN) 500 MG tablet   Oral  Take 500 mg by mouth 2 (two) times daily with a meal.         . perphenazine (TRILAFON) 2 MG tablet   Oral   Take 2 mg by mouth 2 (two) times daily.         Marland Kitchen perphenazine (TRILAFON) 8 MG tablet   Oral   Take 8 mg by mouth at bedtime.         . ribavirin (REBETOL) 200 MG capsule   Oral   Take 600 mg by mouth 2 (two) times daily.         . Sofosbuvir (SOVALDI) 400 MG TABS   Oral   Take 400 mg by mouth daily.         . traZODone (DESYREL) 150 MG tablet   Oral   Take 150 mg by mouth at bedtime.         . diphenhydrAMINE (SOMINEX) 25 MG tablet   Oral   Take 50 mg by mouth at bedtime as needed.         . fluticasone (FLONASE) 50 MCG/ACT nasal spray   Nasal   Place 2 sprays into the nose daily.           . hydrOXYzine (ATARAX/VISTARIL) 50 MG tablet   Oral   Take 25 mg by mouth  every 4 (four) hours as needed. For itching         . LORazepam (ATIVAN) 1 MG tablet   Oral   Take 1 mg by mouth every 8 (eight) hours. For seizures         . Multiple Vitamins-Minerals (MULTIVITAMINS THER. W/MINERALS) TABS   Oral   Take 1 tablet by mouth daily.   30 each      . nicotine (NICODERM CQ - DOSED IN MG/24 HOURS) 21 mg/24hr patch   Transdermal   Place 1 patch (21 patches total) onto the skin daily.   28 patch   0   . omeprazole (PRILOSEC) 40 MG capsule   Oral   Take 40 mg by mouth daily.           Marland Kitchen oxyCODONE-acetaminophen (PERCOCET) 5-325 MG per tablet   Oral   Take 2 tablets by mouth every 4 (four) hours as needed. Used for pain          . Vitamin D, Ergocalciferol, (DRISDOL) 50000 UNITS CAPS   Oral   Take 50,000 Units by mouth every 7 (seven) days. On Monday         . zolpidem (AMBIEN) 10 MG tablet   Oral   Take 5 mg by mouth at bedtime as needed. For sleep          BP 106/68  Pulse 91  Temp(Src) 99 F (37.2 C) (Oral)  Resp 16  Ht 5\' 2"  (1.575 m)  Wt 145 lb (65.772 kg)  BMI 26.51 kg/m2  SpO2 100% Physical Exam  Nursing note and vitals reviewed. Constitutional: She is oriented to person, place, and time. She appears well-developed and well-nourished. No distress.  HENT:  Head: Normocephalic and atraumatic.  Right Ear: External ear normal.  Left Ear: External ear normal.  Nose: Nose normal.  Eyes: Right eye exhibits no discharge. Left eye exhibits no discharge.  Cardiovascular: Normal rate, regular rhythm and normal heart sounds.   Pulmonary/Chest: Effort normal and breath sounds normal.  Abdominal: Soft. There is no tenderness.  Neurological: She is alert and oriented to person, place, and time. She has normal strength. No sensory deficit.  Skin: Skin is warm and dry.    ED Course  Procedures (including critical care time) Labs Review Labs Reviewed  BASIC METABOLIC PANEL - Abnormal; Notable for the following:    Sodium 120 (*)     Chloride 86 (*)    Glucose, Bld 114 (*)    BUN 3 (*)    All other components within normal limits  URINALYSIS, ROUTINE W REFLEX MICROSCOPIC - Abnormal; Notable for the following:    APPearance CLOUDY (*)    Specific Gravity, Urine 1.002 (*)    Leukocytes, UA TRACE (*)    All other components within normal limits  URINE MICROSCOPIC-ADD ON - Abnormal; Notable for the following:    Squamous Epithelial / LPF FEW (*)    All other components within normal limits  SODIUM, URINE, RANDOM  OSMOLALITY, URINE  OSMOLALITY   Imaging Review No results found.  MDM   1. Hyponatremia    Dr. Lillette Boxer accepts in  transfer for admission to [facility]. Will add on labs (osmoles, urine Na and osm). No seizure activity here, appears well. Likely the cause is the excessive fluid intake. Will fluid restrict and put on normal saline at 75/hr.     Audree Camel, MD 01/08/13 [phone]

## 2013-01-08 NOTE — ED Notes (Addendum)
Pt had blood work done yesterday and was called by PMD today with sodium of 127 and sts she was told to come to ED. Pt sts this happens to her every year, this time of year.

## 2013-01-08 NOTE — H&P (Signed)
[facility]                                                                                    Patient Demographics  Tiffany [name], is a 47 y.o. female  MRN: [id number]   DOB - 1966-03-06  Admit Date - 01/08/2013  Outpatient Primary MD for the patient is Karle Plumber, MD   With History of -  Past Medical History  Diagnosis Date  . Bipolar disorder   . Anxiety   . Depression   . Auditory hallucination   . Hallucination, visual   . Psychiatric hospitalization   . Tubal ectopic pregnancy   . GERD (gastroesophageal reflux disease)   . Hepatitis C carrier   . Herpes simplex virus infection     history of  . H/O: eczema   . Anxiety   . Alcohol abuse   . Tobacco abuse   . Schizophrenia       Past Surgical History  Procedure Laterality Date  . Partial left salpingoectomy  05/07/2004    with resection of ectopic pregnancy  . Wisdom tooth extraction    . Liver biopsy  1998  . Elective abortion  1991    in for   Chief Complaint  Patient presents with  . Abnormal Lab     HPI  Tiffany [name]  is a 47 y.o. female, with history of hepatitis C, has been on treatment for the last 2 months, AT [facility] liver care at [facility] with Ribavirin and Sovaldi, patient was sent by her liver physician for abnormal labs, and that was for hyponatremia with sodium of 127 at his office yesterday, she presented to [facility] urgent care center her repeat sodium was 120, she denies anemia medication, but she reports she has been drinking a lot of water recently, more than 1 gallon on average per day, as she was encouraged by her physician increase her fluid intake hydrate ewell,  as well she has been reporting polydipsia as well, patient had one admission in the past for hyponatremia with sodium level of 126, and there was due to alcohol intake, patient denies any seizure like activity, and he altered mental status or confusion, but has been feeling out of focus and weak for the last  week.    Review of Systems    In addition to the HPI above,  No Fever-chills, No Headache, No changes with Vision or hearing, No problems swallowing food or Liquids, No Chest pain, Cough or Shortness of Breath, No Abdominal pain, No Nausea or Vommitting, Bowel movements are regular, No Blood in stool or Urine, No dysuria,has mild polyuria, and polydipsia. No new skin rashes or bruises, No new joints pains-aches,  No new focal weakness, tingling, numbness in any extremity,but generalized weakness. No recent weight gain or loss, No   Polyphagia, but has polyuria, polydypsia No significant Mental Stressors.  A full 10 point Review of Systems was done, except as stated above, all other Review of Systems were negative.   Social History History  Substance Use Topics  . Smoking status: Current Every Day Smoker -- 1.50 packs/day for 20 years    Types: Cigarettes  .  Smokeless tobacco: Never Used  . Alcohol Use: No     Comment: History of drinking 1 six pack of beer daily and 2 six packs on the weekends  no alcohol use at all over last 6 months.   Family History Family History  Problem Relation Age of Onset  . Hypertension    . COPD Father      Prior to Admission medications   Medication Sig Start Date End Date Taking? Authorizing Provider  atomoxetine (STRATTERA) 80 MG capsule Take 80 mg by mouth daily.   Yes Historical Provider, MD  buPROPion (WELLBUTRIN XL) 150 MG 24 hr tablet Take 450 mg by mouth daily.   Yes Historical Provider, MD  citalopram (CELEXA) 40 MG tablet Take 40 mg by mouth every evening.    Yes Historical Provider, MD  fluticasone (FLONASE) 50 MCG/ACT nasal spray Place 2 sprays into the nose daily.     Yes Historical Provider, MD  LORazepam (ATIVAN) 1 MG tablet Take 1 mg by mouth every 6 (six) hours as needed for anxiety.    Yes Historical Provider, MD  Multiple Vitamin (MULTIVITAMIN WITH MINERALS) TABS tablet Take 1 tablet by mouth daily.   Yes Historical  Provider, MD  naproxen (NAPROSYN) 500 MG tablet Take 500 mg by mouth 2 (two) times daily with a meal.   Yes Historical Provider, MD  omeprazole (PRILOSEC) 40 MG capsule Take 40 mg by mouth daily.     Yes Historical Provider, MD  oxyCODONE-acetaminophen (PERCOCET) 5-325 MG per tablet Take 1 tablet by mouth every 8 (eight) hours as needed for pain.    Yes Historical Provider, MD  perphenazine (TRILAFON) 2 MG tablet Take 2 mg by mouth every morning.    Yes Historical Provider, MD  perphenazine (TRILAFON) 8 MG tablet Take 8 mg by mouth at bedtime.   Yes Historical Provider, MD  ribavirin (REBETOL) 200 MG capsule Take 600 mg by mouth 2 (two) times daily.   Yes Historical Provider, MD  Sofosbuvir (SOVALDI) 400 MG TABS Take 400 mg by mouth every evening.    Yes Historical Provider, MD  traZODone (DESYREL) 150 MG tablet Take 150 mg by mouth at bedtime.   Yes Historical Provider, MD    Allergies  Allergen Reactions  . Sulfa Antibiotics Nausea And Vomiting  . Hydrocodone Other (See Comments)    Percocet is ok per patient but this upsets stomach.    Physical Exam  Vitals  Blood pressure 125/77, pulse 90, temperature 97.7 F (36.5 C), temperature source Oral, resp. rate 23, height 5\' 2"  (1.575 m), weight 64.5 kg (142 lb 3.2 oz), SpO2 100.00%.   1. General well nourished,  lying in bed in NAD,    2. Normal affect and insight, Not Suicidal or Homicidal, Awake Alert, Oriented X 3.  3. No F.N deficits, ALL C.Nerves Intact, Strength 5/5 all 4 extremities, Sensation intact all 4 extremities, Plantars down going.  4. Ears and Eyes appear Normal, Conjunctivae clear, PERRLA. Moist Oral Mucosa.  5. Supple Neck, No JVD, No cervical lymphadenopathy appriciated, No Carotid Bruits.  6. Symmetrical Chest wall movement, Good air movement bilaterally, CTAB.  7. RRR, No Gallops, Rubs or Murmurs, No Parasternal Heave.  8. Positive Bowel Sounds, Abdomen Soft, Non tender, No organomegaly appriciated,No  rebound -guarding or rigidity.  9.  No Cyanosis, Normal Skin Turgor, No Skin Rash or Bruise.  10. Good muscle tone,  joints appear normal , no effusions, Normal ROM.  11. No Palpable Lymph Nodes in Neck or  Axillae    Data Review  CBC No results found for this basename: WBC, HGB, HCT, PLT, MCV, MCH, MCHC, RDW, NEUTRABS, LYMPHSABS, MONOABS, EOSABS, BASOSABS, BANDABS, BANDSABD,  in the last 168 hours ------------------------------------------------------------------------------------------------------------------  Chemistries   Recent Labs Lab 01/08/13 1555  NA 120*  K 3.8  CL 86*  CO2 23  GLUCOSE 114*  BUN 3*  CREATININE 0.70  CALCIUM 9.3   ------------------------------------------------------------------------------------------------------------------ estimated creatinine clearance is 78.4 ml/min (by C-G formula based on Cr of 0.7). ------------------------------------------------------------------------------------------------------------------ No results found for this basename: TSH, T4TOTAL, FREET3, T3FREE, THYROIDAB,  in the last 72 hours   Coagulation profile No results found for this basename: INR, PROTIME,  in the last 168 hours ------------------------------------------------------------------------------------------------------------------- No results found for this basename: DDIMER,  in the last 72 hours -------------------------------------------------------------------------------------------------------------------  Cardiac Enzymes No results found for this basename: CK, CKMB, TROPONINI, MYOGLOBIN,  in the last 168 hours ------------------------------------------------------------------------------------------------------------------ No components found with this basename: POCBNP,    ---------------------------------------------------------------------------------------------------------------  Urinalysis    Component Value Date/Time   COLORURINE  YELLOW 01/08/2013 1657   APPEARANCEUR CLOUDY* 01/08/2013 1657   LABSPEC 1.002* 01/08/2013 1657   PHURINE 7.0 01/08/2013 1657   GLUCOSEU NEGATIVE 01/08/2013 1657   HGBUR NEGATIVE 01/08/2013 1657   BILIRUBINUR NEGATIVE 01/08/2013 1657   KETONESUR NEGATIVE 01/08/2013 1657   PROTEINUR NEGATIVE 01/08/2013 1657   UROBILINOGEN 0.2 01/08/2013 1657   NITRITE NEGATIVE 01/08/2013 1657   LEUKOCYTESUR TRACE* 01/08/2013 1657    ----------------------------------------------------------------------------------------------------------------  Imaging results:   No results found.  EKG pending.    Assessment & Plan  Principal Problem:   Hyponatremia Active Problems:   Depression   Anxiety   Tobacco abuse   Hepatitis C    1. Hyponatremia. 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, has she's having increased water intake, and polyuria, as she was encouraged by her physician to hydrate well, has was she's having some risk factors including her psychiatric history and anxiety, patient does not appear to be dehydrated, appears to be in a euvolemic hyponatremia, so we'll restrict her fluids and take to 1200 cc per day, will continue her on Norma saline at 75cc /hr, will monitor her sodium closely and try to avoid rapid correction, will check urine osmolality and sodium and serum osmolality, will have the morning team to consult nephrology if needed, as well hold her Celexa, as some instances it may cause is SIADH, even though this is unlikely here. 2. Tobacco abuse. Patient was counseled for 5 minute, and she will be continued on liquid patch 3. Hepatitis C. Will continue with her Ribavirin and sovaldi. 4. Depression, will continue with her home meds, denies any suicidal ideation, will hold Celexa. 5. Anxiety. Continue with Ativan   DVT Prophylaxis Heparin   AM Labs Ordered, also please review Full Orders  Family Communication: Admission, patients condition and plan of care including tests being ordered  have been discussed with the patient  who indicate understanding and agree with the plan and Code Status.  Code Status  Full code  Likely DC to  home  Condition GUARDED  Time spent in minutes : 60 minutes.    Randol Kern, Lejla Moeser M.D on 01/08/2013 at 8:59 PM      [facility] Office  [phone]

## 2013-01-09 LAB — CBC
Hemoglobin: 10.2 g/dL — ABNORMAL LOW (ref 12.0–15.0)
MCH: 33.7 pg (ref 26.0–34.0)
MCH: 34 pg (ref 26.0–34.0)
MCHC: 33.4 g/dL (ref 30.0–36.0)
MCHC: 34 g/dL (ref 30.0–36.0)
MCV: 100 fL (ref 78.0–100.0)
MCV: 100.7 fL — ABNORMAL HIGH (ref 78.0–100.0)
Platelets: 251 10*3/uL (ref 150–400)
Platelets: 266 10*3/uL (ref 150–400)
RBC: 3 MIL/uL — ABNORMAL LOW (ref 3.87–5.11)
RBC: 3.03 MIL/uL — ABNORMAL LOW (ref 3.87–5.11)
RDW: 13.5 % (ref 11.5–15.5)

## 2013-01-09 LAB — PHOSPHORUS: Phosphorus: 4 mg/dL (ref 2.3–4.6)

## 2013-01-09 LAB — COMPREHENSIVE METABOLIC PANEL
ALT: 12 U/L (ref 0–35)
Albumin: 3.6 g/dL (ref 3.5–5.2)
Alkaline Phosphatase: 44 U/L (ref 39–117)
CO2: 22 mEq/L (ref 19–32)
Chloride: 101 mEq/L (ref 96–112)
Creatinine, Ser: 0.77 mg/dL (ref 0.50–1.10)
GFR calc Af Amer: >90 mL/min (ref >90)
GFR calc non Af Amer: >90 mL/min (ref >90)
Glucose, Bld: 91 mg/dL (ref 70–99)
Potassium: 4.3 mEq/L (ref 3.5–5.1)
Sodium: 132 mEq/L — ABNORMAL LOW (ref 135–145)
Total Bilirubin: 0.3 mg/dL (ref 0.3–1.2)

## 2013-01-09 LAB — SODIUM, URINE, RANDOM: Sodium, Ur: 18 mEq/L

## 2013-01-09 LAB — TSH: TSH: 1.796 u[IU]/mL (ref 0.350–4.500)

## 2013-01-09 LAB — CREATININE, SERUM: GFR calc non Af Amer: >90 mL/min (ref >90)

## 2013-01-09 LAB — OSMOLALITY: Osmolality: 276 mOsm/kg (ref 275–300)

## 2013-01-09 LAB — SODIUM: Sodium: 134 mEq/L — ABNORMAL LOW (ref 135–145)

## 2013-01-09 NOTE — Progress Notes (Signed)
CARE MANAGEMENT NOTE 01/09/2013  Patient:  RUCHAMA, KUBICEK   Account Number:  [id number]  Date Initiated:  01/09/2013  Documentation initiated by:  DAVIS,RHONDA  Subjective/Objective Assessment:   pt with recent treatment for liver deiase found to hypotensive and hyponatremic,     Action/Plan:   home when stable   Anticipated DC Date:  01/12/2013   Anticipated DC Plan:  HOME/SELF CARE  In-house referral  NA      DC Planning Services  NA      [facility] Choice  NA   Choice offered to / List presented to:  NA   DME arranged  NA      DME agency  NA     HH arranged  NA      HH agency  NA   Status of service:  In process, will continue to follow Medicare Important Message given?  NA - LOS <3 / Initial given by admissions (If response is "NO", the following Medicare IM given date fields will be blank) Date Medicare IM given:   Date Additional Medicare IM given:    Discharge Disposition:    Per UR Regulation:  Reviewed for med. necessity/level of care/duration of stay  If discussed at Long Length of Stay Meetings, dates discussed:    Comments:  [id number]/FAOZHY Stark Jock, BSN, [place] [phone] Chart Reviewed for discharge and hospital needs. Discharge needs at time of review:  None Review of patient progress due on [id number].

## 2013-01-09 NOTE — Progress Notes (Signed)
Patient repeat sodium is 131, will D/C NS, no change in mental status, will recheck labs at 5 am.

## 2013-01-09 NOTE — Discharge Summary (Signed)
Physician Discharge Summary  Tiffany [name]:[id number] DOB: 05/15/1965 DOA: 01/08/2013  PCP: Karle Plumber, MD  Admit date: 01/08/2013 Discharge date: 01/09/2013  Time spent: 45 minutes  Recommendations for Outpatient Follow-up:  1. Follow up with PCP in 2 days    Recommendations for primary care physician for things to follow:  Na level recheck Fluid restriction compliance  Discharge Diagnoses:  Principal Problem:   Hyponatremia Active Problems:   Depression   Anxiety   Tobacco abuse   Hepatitis C  Discharge Condition: stable  Diet recommendation: regular, fluid restriction  Filed Weights   01/08/13 1505 01/08/13 1930 01/09/13 0038  Weight: 65.772 kg (145 lb) 64.5 kg (142 lb 3.2 oz) 64 kg (141 lb 1.5 oz)   History of present illness:  Tiffany [name] is a 47 y.o. female, with history of hepatitis C, has been on treatment for the last 2 months, AT [facility] liver care at [facility] with Ribavirin and Sovaldi, patient was sent by her liver physician for abnormal labs, and that was for hyponatremia with sodium of 127 at his office yesterday, she presented to [facility] urgent care center her repeat sodium was 120, she denies anemia medication, but she reports she has been drinking a lot of water recently, more than 1 gallon on average per day, as she was encouraged by her physician increase her fluid intake hydrate ewell, as well she has been reporting polydipsia as well, patient had one admission in the past for hyponatremia with sodium level of 126, and there was due to alcohol intake, patient denies any seizure like activity, and he altered mental status or confusion, but has been feeling out of focus and weak for the last week.  Hospital Course:  Hyponatremia. 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 Sodium levels improved to 130>131>134 and stable. Patient was without confusion and was feeling at baseline. She clearly understands why her Na levels were low. She expressed her wishes to be discharged as she is taking care of her mother who has liver problems. She was advised to call her PCP and have arranged blood work to be done in 2 days for Na recheck. She was extensively counseled as to why she was hyponatremia and she should have fluid restriction to less than 1.8-2L per day and talk to her PCP further about future Na levels follow up. She expressed understanding and reaffirmed that she will follow up with her PCP as instructed.  Tobacco abuse. Patient was counseled for cessation Hepatitis C. To continue with her Ribavirin and sovaldi.  Depression/Anxiety - no SI, feeling at baseline, to continue home medications.   Procedures:  none   Consultations:  none  Discharge Exam: Filed Vitals:   01/09/13 0800 01/09/13 0821 01/09/13 1200 01/09/13 1323  BP:  107/62  127/81  Pulse:  98 96   Temp: 97.9 F (36.6 C)     TempSrc: Oral     Resp:  22 16   Height:      Weight:      SpO2:  100% 100%    General: NAD Cardiovascular: RRR Respiratory: CTA biL  Discharge Instructions    Medication List  atomoxetine 80 MG capsule  Commonly known as:  STRATTERA  Take 80 mg by mouth daily.     buPROPion 150 MG 24 hr tablet  Commonly known as:  WELLBUTRIN XL  Take 450 mg by mouth daily.     citalopram 40 MG tablet  Commonly known as:  CELEXA  Take 40 mg by mouth every evening.     fluticasone 50 MCG/ACT nasal spray  Commonly known as:  FLONASE  Place 2 sprays into the nose daily.     LORazepam 1 MG tablet  Commonly known as:  ATIVAN  Take 1 mg by mouth every 6 (six) hours as needed for anxiety.      multivitamin with minerals Tabs tablet  Take 1 tablet by mouth daily.     naproxen 500 MG tablet  Commonly known as:  NAPROSYN  Take 500 mg by mouth 2 (two) times daily with a meal.     omeprazole 40 MG capsule  Commonly known as:  PRILOSEC  Take 40 mg by mouth daily.     oxyCODONE-acetaminophen 5-325 MG per tablet  Commonly known as:  PERCOCET/ROXICET  Take 1 tablet by mouth every 8 (eight) hours as needed for pain.     perphenazine 2 MG tablet  Commonly known as:  TRILAFON  Take 2 mg by mouth every morning.     perphenazine 8 MG tablet  Commonly known as:  TRILAFON  Take 8 mg by mouth at bedtime.     ribavirin 200 MG capsule  Commonly known as:  REBETOL  Take 600 mg by mouth 2 (two) times daily.     SOVALDI 400 MG Tabs  Generic drug:  Sofosbuvir  Take 400 mg by mouth every evening.     traZODone 150 MG tablet  Commonly known as:  DESYREL  Take 150 mg by mouth at bedtime.           Follow-up Information   Follow up with Karle Plumber, MD. Schedule an appointment as soon as possible for a visit in 2 days. (for repeat sodium level)    Specialty:  Internal Medicine   Contact information:   [address] [place] [address]       The results of significant diagnostics from this hospitalization (including imaging, microbiology, ancillary and laboratory) are listed below for reference.    Significant Diagnostic Studies: No results found.  Microbiology: Recent Results (from the past 240 hour(s))  MRSA PCR SCREENING     Status: None   Collection Time    01/08/13  7:39 PM      Result Value Range Status   MRSA by PCR NEGATIVE  NEGATIVE Final   Comment:            The GeneXpert MRSA Assay (FDA     approved for NASAL specimens     only), is one component of a     comprehensive MRSA colonization     surveillance program. It is not     intended to diagnose MRSA     infection nor to guide or     monitor treatment for     MRSA infections.      Labs: Basic Metabolic Panel:  Recent Labs Lab 01/08/13 1555 01/08/13 2353 01/09/13 0333 01/09/13 1230  NA 120* 131* 132* 134*  K 3.8  --  4.3  --   CL 86*  --  101  --   CO2 23  --  22  --   GLUCOSE  114*  --  91  --   BUN 3*  --  5*  --   CREATININE 0.70 0.78 0.77  --   CALCIUM 9.3  --  8.9  --   MG  --   --  2.3  --   PHOS  --   --  4.0  --    Liver Function Tests:  Recent Labs Lab 01/09/13 0333  AST 12  ALT 12  ALKPHOS 44  BILITOT 0.3  PROT 6.4  ALBUMIN 3.6   CBC:  Recent Labs Lab 01/08/13 2353 01/09/13 0333  WBC 4.9 4.1  HGB 10.2* 10.2*  HCT 30.0* 30.5*  MCV 100.0 100.7*  PLT 251 266    Signed:  Saya Mccoll  [facility] 01/09/2013, 6:14 PM

## 2013-01-09 NOTE — Progress Notes (Signed)
Notified Dr. Randol Kern of pt's BP of 86/46 and sodium level as requested per E- Link. Orders received.

## 2013-01-09 NOTE — Progress Notes (Signed)
Nutrition Brief Note  Patient identified on the Malnutrition Screening Tool (MST) Report. Pt reports that she usually weighs 150 lb, and did so about a month ago, however her physician took her off of a prescription drug that caused weight gain; she suspects that her weight loss is 2/2 to being off that medication. States that her appetite is normal. Denies any nutrition questions/concerns at this time.  Wt Readings from Last 15 Encounters:  01/09/13 141 lb 1.5 oz (64 kg)  03/25/11 134 lb (60.782 kg)  03/23/11 129 lb 8 oz (58.741 kg)  12/26/10 135 lb (61.236 kg)  08/21/07 125 lb 4 oz (56.813 kg)  05/18/07 131 lb 8 oz (59.648 kg)  03/05/07 134 lb 8 oz (61.009 kg)    Body mass index is 25.8 kg/(m^2). Patient meets criteria for overweight based on current BMI.   Current diet order is Regular. Labs and medications reviewed.   No nutrition interventions warranted at this time. If nutrition issues arise, please consult RD.   Jarold Motto MS, RD, LDN Pager: [phone] After-hours pager: [phone]
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
## 2013-11-28 ENCOUNTER — Emergency Department (HOSPITAL_COMMUNITY)
Admission: EM | Admit: 2013-11-28 | Discharge: 2013-11-29 | Disposition: A | Discharge status: Other Acute Inpatient Hospital | Payer: Medicare Other | Source: Home / Self Care | Attending: Emergency Medicine | Admitting: Emergency Medicine

## 2013-11-28 ENCOUNTER — Encounter (HOSPITAL_COMMUNITY): Payer: Self-pay | Admitting: Emergency Medicine

## 2013-11-28 DIAGNOSIS — K219 Gastro-esophageal reflux disease without esophagitis: Secondary | ICD-10-CM | POA: Insufficient documentation

## 2013-11-28 DIAGNOSIS — F3110 Bipolar disorder, current episode manic without psychotic features, unspecified: Secondary | ICD-10-CM | POA: Diagnosis not present

## 2013-11-28 DIAGNOSIS — Z872 Personal history of diseases of the skin and subcutaneous tissue: Secondary | ICD-10-CM | POA: Insufficient documentation

## 2013-11-28 DIAGNOSIS — F312 Bipolar disorder, current episode manic severe with psychotic features: Secondary | ICD-10-CM | POA: Insufficient documentation

## 2013-11-28 DIAGNOSIS — Z008 Encounter for other general examination: Secondary | ICD-10-CM | POA: Insufficient documentation

## 2013-11-28 DIAGNOSIS — IMO0002 Reserved for concepts with insufficient information to code with codable children: Secondary | ICD-10-CM | POA: Insufficient documentation

## 2013-11-28 DIAGNOSIS — F1510 Other stimulant abuse, uncomplicated: Secondary | ICD-10-CM | POA: Insufficient documentation

## 2013-11-28 DIAGNOSIS — F323 Major depressive disorder, single episode, severe with psychotic features: Secondary | ICD-10-CM | POA: Diagnosis present

## 2013-11-28 DIAGNOSIS — B182 Chronic viral hepatitis C: Secondary | ICD-10-CM | POA: Insufficient documentation

## 2013-11-28 DIAGNOSIS — Z8619 Personal history of other infectious and parasitic diseases: Secondary | ICD-10-CM | POA: Insufficient documentation

## 2013-11-28 DIAGNOSIS — F411 Generalized anxiety disorder: Secondary | ICD-10-CM | POA: Insufficient documentation

## 2013-11-28 DIAGNOSIS — Z8669 Personal history of other diseases of the nervous system and sense organs: Secondary | ICD-10-CM | POA: Insufficient documentation

## 2013-11-28 DIAGNOSIS — Z791 Long term (current) use of non-steroidal anti-inflammatories (NSAID): Secondary | ICD-10-CM | POA: Insufficient documentation

## 2013-11-28 DIAGNOSIS — F17200 Nicotine dependence, unspecified, uncomplicated: Secondary | ICD-10-CM | POA: Insufficient documentation

## 2013-11-28 DIAGNOSIS — Z79899 Other long term (current) drug therapy: Secondary | ICD-10-CM | POA: Insufficient documentation

## 2013-11-28 LAB — CBC
HCT: 38.9 % (ref 36.0–46.0)
Hemoglobin: 13.4 g/dL (ref 12.0–15.0)
MCH: 32.3 pg (ref 26.0–34.0)
MCHC: 34.4 g/dL (ref 30.0–36.0)
MCV: 93.7 fL (ref 78.0–100.0)
PLATELETS: 280 10*3/uL (ref 150–400)
RBC: 4.15 MIL/uL (ref 3.87–5.11)
RDW: 13.4 % (ref 11.5–15.5)
WBC: 6.2 10*3/uL (ref 4.0–10.5)

## 2013-11-28 LAB — ACETAMINOPHEN LEVEL

## 2013-11-28 LAB — COMPREHENSIVE METABOLIC PANEL
ALT: 15 U/L (ref 0–35)
AST: 30 U/L (ref 0–37)
Albumin: 4.3 g/dL (ref 3.5–5.2)
Alkaline Phosphatase: 73 U/L (ref 39–117)
Anion gap: 16 — ABNORMAL HIGH (ref 5–15)
BUN: 3 mg/dL — ABNORMAL LOW (ref 6–23)
CO2: 23 mEq/L (ref 19–32)
Calcium: 10.2 mg/dL (ref 8.4–10.5)
Chloride: 94 mEq/L — ABNORMAL LOW (ref 96–112)
Creatinine, Ser: 0.77 mg/dL (ref 0.50–1.10)
GFR calc Af Amer: >90 mL/min (ref >90)
GFR calc non Af Amer: >90 mL/min (ref >90)
Glucose, Bld: 96 mg/dL (ref 70–99)
Potassium: 4 mEq/L (ref 3.7–5.3)
Sodium: 133 mEq/L — ABNORMAL LOW (ref 137–147)
Total Bilirubin: 0.3 mg/dL (ref 0.3–1.2)
Total Protein: 7.7 g/dL (ref 6.0–8.3)

## 2013-11-28 LAB — ETHANOL: Alcohol, Ethyl (B): <11 mg/dL (ref 0–11)

## 2013-11-28 LAB — RAPID URINE DRUG SCREEN, HOSP PERFORMED
Amphetamines: POSITIVE — AB
Barbiturates: NOT DETECTED
Benzodiazepines: NOT DETECTED
Cocaine: NOT DETECTED
Opiates: NOT DETECTED
TETRAHYDROCANNABINOL: NOT DETECTED

## 2013-11-28 LAB — SALICYLATE LEVEL: Salicylate Lvl: <2 mg/dL — ABNORMAL LOW (ref 2.8–20.0)

## 2013-11-28 MED ORDER — CITALOPRAM HYDROBROMIDE 40 MG PO TABS
40.0000 mg | ORAL_TABLET | Freq: Every evening | ORAL | Status: DC
  Filled 2013-11-28 (×2): qty 1

## 2013-11-28 MED ORDER — LORAZEPAM 2 MG/ML IJ SOLN
1.0000 mg | Freq: Once | INTRAMUSCULAR | Status: AC
  Administered 2013-11-28: 1 mg via INTRAMUSCULAR
  Filled 2013-11-28: qty 1

## 2013-11-28 MED ORDER — ZOLPIDEM TARTRATE 10 MG PO TABS: 10.0000 mg | ORAL_TABLET | Freq: Every evening | ORAL | Status: DC | PRN

## 2013-11-28 MED ORDER — PERPHENAZINE 2 MG PO TABS
2.0000 mg | ORAL_TABLET | Freq: Every morning | ORAL | Status: DC
  Administered 2013-11-29: 2 mg via ORAL
  Filled 2013-11-28 (×2): qty 1

## 2013-11-28 MED ORDER — SOFOSBUVIR 400 MG PO TABS: 400.0000 mg | ORAL_TABLET | Freq: Every evening | ORAL | Status: DC

## 2013-11-28 MED ORDER — ONDANSETRON HCL 4 MG PO TABS: 4.0000 mg | ORAL_TABLET | Freq: Three times a day (TID) | ORAL | Status: DC | PRN

## 2013-11-28 MED ORDER — ACETAMINOPHEN 325 MG PO TABS: 650.0000 mg | ORAL_TABLET | ORAL | Status: DC | PRN

## 2013-11-28 MED ORDER — ZIPRASIDONE MESYLATE 20 MG IM SOLR
20.0000 mg | Freq: Three times a day (TID) | INTRAMUSCULAR | Status: DC | PRN
  Administered 2013-11-28: 20 mg via INTRAMUSCULAR
  Filled 2013-11-28: qty 20

## 2013-11-28 MED ORDER — TRAZODONE HCL 50 MG PO TABS: 150.0000 mg | ORAL_TABLET | Freq: Every day | ORAL | Status: DC

## 2013-11-28 MED ORDER — DIPHENHYDRAMINE HCL 50 MG/ML IJ SOLN
50.0000 mg | Freq: Once | INTRAMUSCULAR | Status: AC
  Administered 2013-11-28: 50 mg via INTRAMUSCULAR
  Filled 2013-11-28: qty 1

## 2013-11-28 MED ORDER — PANTOPRAZOLE SODIUM 40 MG PO TBEC
80.0000 mg | DELAYED_RELEASE_TABLET | Freq: Every day | ORAL | Status: DC
  Administered 2013-11-29: 80 mg via ORAL
  Filled 2013-11-28: qty 2

## 2013-11-28 MED ORDER — IBUPROFEN 200 MG PO TABS: 600.0000 mg | ORAL_TABLET | Freq: Three times a day (TID) | ORAL | Status: DC | PRN

## 2013-11-28 MED ORDER — ALUM & MAG HYDROXIDE-SIMETH 200-200-20 MG/5ML PO SUSP: 30.0000 mL | ORAL | Status: DC | PRN

## 2013-11-28 MED ORDER — RIBAVIRIN 200 MG PO CAPS
600.0000 mg | ORAL_CAPSULE | Freq: Two times a day (BID) | ORAL | Status: DC
  Filled 2013-11-28 (×4): qty 3

## 2013-11-28 MED ORDER — LORAZEPAM 1 MG PO TABS
1.0000 mg | ORAL_TABLET | Freq: Three times a day (TID) | ORAL | Status: DC | PRN
  Administered 2013-11-29: 1 mg via ORAL
  Filled 2013-11-28: qty 1

## 2013-11-28 MED ORDER — PERPHENAZINE 8 MG PO TABS
8.0000 mg | ORAL_TABLET | Freq: Every day | ORAL | Status: DC
  Filled 2013-11-28 (×2): qty 1

## 2013-11-28 MED ORDER — BUPROPION HCL ER (XL) 300 MG PO TB24
450.0000 mg | ORAL_TABLET | Freq: Every day | ORAL | Status: DC
  Administered 2013-11-29: 450 mg via ORAL
  Filled 2013-11-28 (×2): qty 1

## 2013-11-28 MED ORDER — ATOMOXETINE HCL 40 MG PO CAPS
80.0000 mg | ORAL_CAPSULE | Freq: Every day | ORAL | Status: DC
  Administered 2013-11-29: 80 mg via ORAL
  Filled 2013-11-28 (×2): qty 2

## 2013-11-28 MED ORDER — NAPROXEN 500 MG PO TABS: 500.0000 mg | ORAL_TABLET | Freq: Two times a day (BID) | ORAL | Status: DC

## 2013-11-28 MED ORDER — NICOTINE 21 MG/24HR TD PT24
21.0000 mg | MEDICATED_PATCH | Freq: Every day | TRANSDERMAL | Status: DC
  Administered 2013-11-29: 21 mg via TRANSDERMAL
  Filled 2013-11-28: qty 1

## 2013-11-28 NOTE — ED Notes (Addendum)
Up to the desk on the phone.  Pt intermittantly turning to talk to someone (no one is present), and singing
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
## 2013-11-28 NOTE — ED Notes (Signed)
Bed: WA15 Expected date:  Expected time:  Means of arrival:  Comments: ems 

## 2013-11-28 NOTE — ED Notes (Signed)
Patient stated several times that "payback is hell." Patient stops talking at times and talks into the air as if talking to someone else. Patient talks in a angry voice.

## 2013-11-28 NOTE — BH Assessment (Signed)
Tele Assessment Note   Tiffany [name] is a 48 y.o. female who presents IVC petition, initiated by her sister who states she received a call from the neighbors about pt.'s recent behavior. The following is collateral information from pt.'s sister([id number]): she states she has custody of pt.'s 47 yr old daughter([id number]) because of her irrational and irresponsible behavior.  Pt drinks daily and is a heavy drinker--"she could kill a 12pk of beer easily".  Her home is in foreclosure and financial means are depleted.  Pt.'s neighbors heard banging last night and told sister that after the police arrived to escort pt to the emerg dept, they inspected the home and holes in the walls where it appears pt may have used any object to damage the home.  Pt.'s sister told this writer that when she told with her earlier, her speech was pressured and rapid and she made very little sense during the conversation.  Sister was told by her mother's that pt is allegedly crushing wellbutrin pills and snorting them.  Pt is angry and yelling at family/friends.    Upon arrival to the emerg dept, pt was yelling and had no pants on and was handcuffed.  Pt manage to tell this writer that she was at home and the police came to her door and brought her to the [name] and she doesn't know why.  Pt admitted that she had been drinking approx 4 beers today.  Pt says she is compliant with med regimen and has outpatient services with [name]), last appt with Dr. Betti Cruzeddy was 11/27/13.  This Clinical research associatewriter observed the following: pt has rapid/pressured speech, tangential and thought blocking at times during interview and excuses inability to remain focused.  Pt is anger at sister for petition and made several statements that "payback is hell" and is speaking in an angry voice.  Pt stops talking and speaks to another person that is not present.  Pt intentionally urinated in the chair(in her room), floor and in the bed.  Pt  recv'd im injection as ordered by the extender and told the nurses--"I know my hypocratic rights".  She was cursing and acting strange, going to the bathroom several times with using it and lying the floor in the hallway in the psych ed.       Axis I: Bipolar I disorder, Current or most recent episode manic, With psychotic features; Alcohol use disorder Axis II: Deferred Axis III:  Past Medical History  Diagnosis Date  . Bipolar disorder   . Anxiety   . Depression   . Auditory hallucination   . Hallucination, visual   . Psychiatric hospitalization   . Tubal ectopic pregnancy   . GERD (gastroesophageal reflux disease)   . Hepatitis C carrier   . Herpes simplex virus infection     history of  . H/O: eczema   . Anxiety   . Alcohol abuse   . Tobacco abuse   . Schizophrenia    Axis IV: economic problems, housing problems, other psychosocial or environmental problems, problems related to social environment and problems with primary support group Axis V: 21-30 behavior considerably influenced by delusions or hallucinations OR serious impairment in judgment, communication OR inability to function in almost all areas  Past Medical History:  Past Medical History  Diagnosis Date  . Bipolar disorder   . Anxiety   . Depression   . Auditory hallucination   . Hallucination, visual   . Psychiatric hospitalization   . Tubal ectopic pregnancy   .  GERD (gastroesophageal reflux disease)   . Hepatitis C carrier   . Herpes simplex virus infection     history of  . H/O: eczema   . Anxiety   . Alcohol abuse   . Tobacco abuse   . Schizophrenia     Past Surgical History  Procedure Laterality Date  . Partial left salpingoectomy  05/07/2004    with resection of ectopic pregnancy  . Wisdom tooth extraction    . Liver biopsy  1998  . Elective abortion  1991    Family History:  Family History  Problem Relation Age of Onset  . Hypertension    . COPD Father     Social History:  reports  that she has been smoking Cigarettes.  She has a 30 pack-year smoking history. She has never used smokeless tobacco. She reports that she drinks alcohol. She reports that she uses illicit drugs (Marijuana).  Additional Social History:  Alcohol / Drug Use Pain Medications: See MAR  Prescriptions: See MAR  Over the Counter: See MAR  History of alcohol / drug use?: Yes Longest period of sobriety (when/how long): Per sister pt is a heavy drinker  CIWA: CIWA-Ar BP: 124/91 mmHg Pulse Rate: 99 COWS:    PATIENT STRENGTHS: (choose at least two) Supportive family/friends  Allergies:  Allergies  Allergen Reactions  . Sulfa Antibiotics Nausea And Vomiting  . Hydrocodone Other (See Comments)    Percocet is ok per patient but this upsets stomach.    Home Medications:  (Not in a [name] admission)  OB/GYN Status:  No LMP recorded. Patient is postmenopausal.  General Assessment Data Location of Assessment: [facility] Is this a Tele or Face-to-Face Assessment?: Face-to-Face Is this an Initial Assessment or a Re-assessment for this encounter?: Initial Assessment Living Arrangements: Alone Can pt return to current living arrangement?: Yes Admission Status: Involuntary Is patient capable of signing voluntary admission?: No Transfer from: Acute [name] Referral Source: MD  Medical Screening Exam Self [facility]) Medical Exam completed: No Reason for MSE not completed: Other: (None )  [place] [name] Crisis Care Plan Living Arrangements: Alone Name of Psychiatrist: Dr. Betti [name]  Name of Therapist: [name]   Education Status Is patient currently in school?: No Current Grade: None  Highest grade of school patient has completed: None  Name of school: None  Contact person: None   Risk to self with the past 6 months Suicidal Ideation: No Suicidal Intent: No Is patient at risk for suicide?: No Suicidal Plan?: No Access to Means: No What has been your use of drugs/alcohol  within the last 12 months?: Abusing; alcohol  Previous Attempts/Gestures: No How many times?: 0 Other Self Harm Risks: None  Triggers for Past Attempts: None known Intentional Self Injurious Behavior: None Family Suicide History: No Recent stressful life event(s): Financial Problems;Loss (Comment) (Home in foreclosure; sister has custody of daughter ) Persecutory voices/beliefs?: No Depression: Yes Depression Symptoms: Feeling angry/irritable Substance abuse history and/or treatment for substance abuse?: Yes Suicide prevention information given to non-admitted patients: Not applicable  Risk to Others within the past 6 months Homicidal Ideation: No Thoughts of Harm to Others: No Current Homicidal Intent: No Current Homicidal Plan: No Access to Homicidal Means: No Identified Victim: None  History of harm to others?: No Assessment of Violence: None Noted Violent Behavior Description: None  Does patient have access to weapons?: No Criminal Charges Pending?: No Does patient have a court date: No  Psychosis Hallucinations: None noted Delusions: None noted  Mental Status  Report Appear/Hygiene: Disheveled;In scrubs Eye Contact: Good Motor Activity: Agitation Speech: Rapid;Pressured;Loud;Tangential;Aggressive Level of Consciousness: Alert;Irritable Mood: Anxious;Labile;Irritable Affect: Irritable;Anxious;Labile Anxiety Level: Moderate Thought Processes: Tangential;Flight of Ideas;Thought Blocking Judgement: Impaired Orientation: Person;Place;Time;Situation Obsessive Compulsive Thoughts/Behaviors: None  Cognitive Functioning Concentration: Decreased Memory: Recent Intact;Remote Intact IQ: Average Insight: Poor Impulse Control: Poor Appetite: Good Weight Loss: 0 Weight Gain: 0 Sleep: Decreased Total Hours of Sleep:  (Intermittent ) Vegetative Symptoms: None  ADLScreening [facility]) Patient's cognitive ability adequate to safely complete daily  activities?: Yes Patient able to express need for assistance with ADLs?: Yes Independently performs ADLs?: Yes (appropriate for developmental age)  Prior Inpatient Therapy Prior Inpatient Therapy: Yes Prior Therapy Dates: 2011,2012 Prior Therapy Facilty/Provider(s): [name]  Reason for Treatment: Psychosis/Mania   Prior Outpatient Therapy Prior Outpatient Therapy: Yes Prior Therapy Dates: Current  Prior Therapy Facilty/Provider(s[facility]  Reason for Treatment: Med mgt/Therapy   ADL Screening (condition at time of admission) Patient's cognitive ability adequate to safely complete daily activities?: Yes Is the patient deaf or have difficulty hearing?: No Does the patient have difficulty seeing, even when wearing glasses/contacts?: No Does the patient have difficulty concentrating, remembering, or making decisions?: Yes Patient able to express need for assistance with ADLs?: Yes Does the patient have difficulty dressing or bathing?: No Independently performs ADLs?: Yes (appropriate for developmental age) Does the patient have difficulty walking or climbing stairs?: No Weakness of Legs: None Weakness of Arms/Hands: None  Home Assistive Devices/Equipment Home Assistive Devices/Equipment: None  Therapy Consults (therapy consults require a physician order) PT Evaluation Needed: No OT Evalulation Needed: No SLP Evaluation Needed: No Abuse/Neglect Assessment (Assessment to be complete while patient is alone) Physical Abuse: Denies Verbal Abuse: Denies Sexual Abuse: Denies Exploitation of patient/patient's resources: Denies Self-Neglect: Denies Values / Beliefs Cultural Requests During Hospitalization: None Spiritual Requests During Hospitalization: None Consults Spiritual Care Consult Needed: No Social Work Consult Needed: No Merchant navy officer (For Healthcare) Does patient have an advance directive?: No Would patient like information on  creating an advanced directive?: No - patient declined information Nutrition Screen- [facility] Patient's home diet: Regular  Additional Information 1:1 In Past 12 Months?: No CIRT Risk: No Elopement Risk: No Does patient have medical clearance?: Yes     Disposition:  Disposition Initial Assessment Completed for this Encounter: Yes Disposition of Patient: Inpatient treatment program;Referred to (AM psych eval to uphold/rescind petition ) Type of inpatient treatment program: Adult Patient referred to: Other (Comment) (AM psych eval to uphold/rescind petition )  Murrell Redden 11/28/2013 11:38 PM

## 2013-11-28 NOTE — ED Notes (Signed)
Spoke to lab, urine sample sent is insufficient for both UA and UDS. UDS is being completed on specimen sent, we will need to send a second specimen for the UA. Primary nurse, Latricia notified.

## 2013-11-28 NOTE — ED Notes (Signed)
Patient states "Tiffany [name] is my brother.

## 2013-11-28 NOTE — ED Notes (Signed)
Patient has IVC papers. Patient brought in by [employer] and is hand cuffed. Patient denies any auditory or visual hallucinations. Patient denies HI/SI. IVC papers states that the patient is a danger to self and others.  Patient states she a "couple of beers 2 hours ago." Patient denies any other drug use. Patient voicing anger at family member that took the IVC paper out.

## 2013-11-28 NOTE — ED Notes (Signed)
Pt being eval by TTS Terri at present.

## 2013-11-28 NOTE — ED Notes (Addendum)
Received pt intermittently standing in hallway, cursing, acting bizarrely, refusing oral meds.  Pt given IM injection.  Will continue to monitor for safety.

## 2013-11-29 ENCOUNTER — Inpatient Hospital Stay (HOSPITAL_COMMUNITY)
Admission: AD | Admit: 2013-11-29 | Discharge: 2013-12-04 | DRG: 885 | Disposition: A | Discharge status: Home / Self Care | Payer: Medicare Other | Source: Intra-hospital | Attending: Psychiatry | Admitting: Psychiatry

## 2013-11-29 ENCOUNTER — Encounter (HOSPITAL_COMMUNITY): Payer: Self-pay | Admitting: *Deleted

## 2013-11-29 DIAGNOSIS — F419 Anxiety disorder, unspecified: Secondary | ICD-10-CM

## 2013-11-29 DIAGNOSIS — Z609 Problem related to social environment, unspecified: Secondary | ICD-10-CM

## 2013-11-29 DIAGNOSIS — F1994 Other psychoactive substance use, unspecified with psychoactive substance-induced mood disorder: Secondary | ICD-10-CM

## 2013-11-29 DIAGNOSIS — F29 Unspecified psychosis not due to a substance or known physiological condition: Secondary | ICD-10-CM

## 2013-11-29 DIAGNOSIS — F1010 Alcohol abuse, uncomplicated: Secondary | ICD-10-CM | POA: Diagnosis present

## 2013-11-29 DIAGNOSIS — F314 Bipolar disorder, current episode depressed, severe, without psychotic features: Secondary | ICD-10-CM

## 2013-11-29 DIAGNOSIS — F3110 Bipolar disorder, current episode manic without psychotic features, unspecified: Secondary | ICD-10-CM | POA: Diagnosis present

## 2013-11-29 DIAGNOSIS — F323 Major depressive disorder, single episode, severe with psychotic features: Secondary | ICD-10-CM | POA: Diagnosis present

## 2013-11-29 DIAGNOSIS — F209 Schizophrenia, unspecified: Secondary | ICD-10-CM | POA: Diagnosis present

## 2013-11-29 DIAGNOSIS — F22 Delusional disorders: Secondary | ICD-10-CM

## 2013-11-29 DIAGNOSIS — F17200 Nicotine dependence, unspecified, uncomplicated: Secondary | ICD-10-CM | POA: Diagnosis present

## 2013-11-29 DIAGNOSIS — G4700 Insomnia, unspecified: Secondary | ICD-10-CM | POA: Diagnosis present

## 2013-11-29 DIAGNOSIS — F319 Bipolar disorder, unspecified: Secondary | ICD-10-CM

## 2013-11-29 DIAGNOSIS — F411 Generalized anxiety disorder: Secondary | ICD-10-CM | POA: Diagnosis present

## 2013-11-29 DIAGNOSIS — K219 Gastro-esophageal reflux disease without esophagitis: Secondary | ICD-10-CM | POA: Diagnosis present

## 2013-11-29 DIAGNOSIS — B182 Chronic viral hepatitis C: Secondary | ICD-10-CM | POA: Diagnosis not present

## 2013-11-29 DIAGNOSIS — F332 Major depressive disorder, recurrent severe without psychotic features: Secondary | ICD-10-CM

## 2013-11-29 LAB — URINALYSIS, ROUTINE W REFLEX MICROSCOPIC
Bilirubin Urine: NEGATIVE
GLUCOSE, UA: NEGATIVE mg/dL
HGB URINE DIPSTICK: NEGATIVE
Ketones, ur: NEGATIVE mg/dL
LEUKOCYTES UA: NEGATIVE
Nitrite: NEGATIVE
Protein, ur: NEGATIVE mg/dL
SPECIFIC GRAVITY, URINE: 1.005 (ref 1.005–1.030)
UROBILINOGEN UA: 0.2 mg/dL (ref 0.0–1.0)
pH: 7 (ref 5.0–8.0)

## 2013-11-29 MED ORDER — BUPROPION HCL ER (XL) 300 MG PO TB24
450.0000 mg | ORAL_TABLET | Freq: Every day | ORAL | Status: DC
  Filled 2013-11-29 (×2): qty 1

## 2013-11-29 MED ORDER — HALOPERIDOL LACTATE 5 MG/ML IJ SOLN
5.0000 mg | Freq: Four times a day (QID) | INTRAMUSCULAR | Status: DC | PRN
  Filled 2013-11-29: qty 1

## 2013-11-29 MED ORDER — CITALOPRAM HYDROBROMIDE 40 MG PO TABS
40.0000 mg | ORAL_TABLET | Freq: Every evening | ORAL | Status: DC
  Administered 2013-11-29: 40 mg via ORAL
  Filled 2013-11-29 (×2): qty 1
  Filled 2013-11-29: qty 2

## 2013-11-29 MED ORDER — ALUM & MAG HYDROXIDE-SIMETH 200-200-20 MG/5ML PO SUSP: 30.0000 mL | ORAL | Status: DC | PRN

## 2013-11-29 MED ORDER — DIPHENHYDRAMINE HCL 50 MG/ML IJ SOLN
INTRAMUSCULAR | Status: AC
  Filled 2013-11-29: qty 1

## 2013-11-29 MED ORDER — NICOTINE 21 MG/24HR TD PT24
21.0000 mg | MEDICATED_PATCH | Freq: Every day | TRANSDERMAL | Status: DC
  Administered 2013-11-30 – 2013-12-04 (×5): 21 mg via TRANSDERMAL
  Filled 2013-11-29 (×8): qty 1

## 2013-11-29 MED ORDER — NAPROXEN 500 MG PO TABS
500.0000 mg | ORAL_TABLET | Freq: Two times a day (BID) | ORAL | Status: DC
  Administered 2013-11-29 – 2013-12-04 (×10): 500 mg via ORAL
  Filled 2013-11-29 (×15): qty 1

## 2013-11-29 MED ORDER — LORAZEPAM 1 MG PO TABS
1.0000 mg | ORAL_TABLET | Freq: Three times a day (TID) | ORAL | Status: DC | PRN
  Administered 2013-11-29 – 2013-11-30 (×3): 1 mg via ORAL
  Filled 2013-11-29 (×3): qty 1

## 2013-11-29 MED ORDER — TRAZODONE HCL 150 MG PO TABS: 150.0000 mg | ORAL_TABLET | Freq: Every day | ORAL | Status: DC

## 2013-11-29 MED ORDER — LORAZEPAM 2 MG/ML IJ SOLN
INTRAMUSCULAR | Status: AC
  Filled 2013-11-29: qty 1

## 2013-11-29 MED ORDER — HALOPERIDOL LACTATE 5 MG/ML IJ SOLN
10.0000 mg | Freq: Once | INTRAMUSCULAR | Status: AC
  Administered 2013-11-29: 10 mg via INTRAMUSCULAR
  Filled 2013-11-29: qty 2

## 2013-11-29 MED ORDER — LORAZEPAM 2 MG/ML IJ SOLN
2.0000 mg | Freq: Four times a day (QID) | INTRAMUSCULAR | Status: DC | PRN
  Filled 2013-11-29: qty 1

## 2013-11-29 MED ORDER — PERPHENAZINE 8 MG PO TABS
8.0000 mg | ORAL_TABLET | Freq: Every day | ORAL | Status: DC
  Administered 2013-11-29: 8 mg via ORAL
  Filled 2013-11-29: qty 2
  Filled 2013-11-29 (×3): qty 1

## 2013-11-29 MED ORDER — HALOPERIDOL LACTATE 5 MG/ML IJ SOLN
INTRAMUSCULAR | Status: AC
  Filled 2013-11-29: qty 2

## 2013-11-29 MED ORDER — PERPHENAZINE 2 MG PO TABS
2.0000 mg | ORAL_TABLET | Freq: Every morning | ORAL | Status: DC
  Administered 2013-11-30: 2 mg via ORAL
  Filled 2013-11-29 (×3): qty 1

## 2013-11-29 MED ORDER — SOFOSBUVIR 400 MG PO TABS: 400.0000 mg | ORAL_TABLET | Freq: Every evening | ORAL | Status: DC

## 2013-11-29 MED ORDER — DIPHENHYDRAMINE HCL 50 MG/ML IJ SOLN
50.0000 mg | Freq: Four times a day (QID) | INTRAMUSCULAR | Status: DC | PRN
  Filled 2013-11-29: qty 1

## 2013-11-29 MED ORDER — ATOMOXETINE HCL 40 MG PO CAPS
80.0000 mg | ORAL_CAPSULE | Freq: Every day | ORAL | Status: DC
  Filled 2013-11-29 (×2): qty 2

## 2013-11-29 MED ORDER — DIPHENHYDRAMINE HCL 50 MG/ML IJ SOLN
50.0000 mg | Freq: Once | INTRAMUSCULAR | Status: AC
  Administered 2013-11-29: 50 mg via INTRAMUSCULAR
  Filled 2013-11-29: qty 1

## 2013-11-29 MED ORDER — ONDANSETRON HCL 4 MG PO TABS: 4.0000 mg | ORAL_TABLET | Freq: Three times a day (TID) | ORAL | Status: DC | PRN

## 2013-11-29 MED ORDER — PANTOPRAZOLE SODIUM 40 MG PO TBEC
80.0000 mg | DELAYED_RELEASE_TABLET | Freq: Every day | ORAL | Status: DC
Start: 2013-11-30 — End: 2013-12-04
  Administered 2013-11-30 – 2013-12-04 (×5): 80 mg via ORAL
  Filled 2013-11-29 (×7): qty 2

## 2013-11-29 MED ORDER — ACETAMINOPHEN 325 MG PO TABS: 650.0000 mg | ORAL_TABLET | ORAL | Status: DC | PRN

## 2013-11-29 MED ORDER — RIBAVIRIN 200 MG PO CAPS
600.0000 mg | ORAL_CAPSULE | Freq: Two times a day (BID) | ORAL | Status: DC
  Filled 2013-11-29 (×7): qty 3

## 2013-11-29 MED ORDER — TRAZODONE HCL 150 MG PO TABS
150.0000 mg | ORAL_TABLET | Freq: Every day | ORAL | Status: DC
  Filled 2013-11-29 (×3): qty 1
  Filled 2013-11-29: qty 14
  Filled 2013-11-29 (×4): qty 1

## 2013-11-29 MED ORDER — LORAZEPAM 2 MG/ML IJ SOLN
2.0000 mg | Freq: Once | INTRAMUSCULAR | Status: AC
  Administered 2013-11-29: 2 mg via INTRAMUSCULAR

## 2013-11-29 NOTE — Consult Note (Signed)
[facility] Face-to-Face Psychiatry Consult   Reason for Consult:  Psychosis Referring Physician:  EDP  Tiffany [name] is an 48 y.o. female. Total Time spent with patient: 20 minutes  Assessment: AXIS I:  Major Depression, Recurrent severe and Psychotic Disorder NOS AXIS II:  Deferred AXIS III:   Past Medical History  Diagnosis Date  . Bipolar disorder   . Anxiety   . Depression   . Auditory hallucination   . Hallucination, visual   . Psychiatric hospitalization   . Tubal ectopic pregnancy   . GERD (gastroesophageal reflux disease)   . Hepatitis C carrier   . Herpes simplex virus infection     history of  . H/O: eczema   . Anxiety   . Alcohol abuse   . Tobacco abuse   . Schizophrenia    AXIS IV:  other psychosocial or environmental problems, problems related to social environment and problems with primary support group AXIS V:  21-30 behavior considerably influenced by delusions or hallucinations OR serious impairment in judgment, communication OR inability to function in almost all areas  Plan:  Recommend psychiatric Inpatient admission when medically cleared.Dr. Dwyane Dee assessed the patient and concurs with the plan.  Subjective:   JLEE HARKLESS is a 48 y.o. female patient admitted with psychosis.  HPI:  The patient was committed by her sister for throwing things at her house and destroying her property.  She was talking to someone that was not there when the police came to get her.  Today, she continues to talk and yell at people in her room that are not there.  She denies everything but sees Dr. Reece Levy and takes Ativan, Wellbutrin, and was suppose to Saphris but did not take it.  Karra also takes Adderall for her ADD.  Angry and irritable on assessment, denies suicidal/homicidal ideations and alcohol/drug use. HPI Elements:   Location:  generalized. Quality:  acute. Severity:  severe. Timing:  constant. Duration:  few days. Context:  not taking her medications.  Past  Psychiatric History: Past Medical History  Diagnosis Date  . Bipolar disorder   . Anxiety   . Depression   . Auditory hallucination   . Hallucination, visual   . Psychiatric hospitalization   . Tubal ectopic pregnancy   . GERD (gastroesophageal reflux disease)   . Hepatitis C carrier   . Herpes simplex virus infection     history of  . H/O: eczema   . Anxiety   . Alcohol abuse   . Tobacco abuse   . Schizophrenia     reports that she has been smoking Cigarettes.  She has a 30 pack-year smoking history. She has never used smokeless tobacco. She reports that she drinks alcohol. She reports that she uses illicit drugs (Marijuana). Family History  Problem Relation Age of Onset  . Hypertension    . COPD Father          Abuse/Neglect [facility]) Physical Abuse: Denies Verbal Abuse: Denies Sexual Abuse: Denies Allergies:   Allergies  Allergen Reactions  . Sulfa Antibiotics Nausea And Vomiting  . Hydrocodone Other (See Comments)    Percocet is ok per patient but this upsets stomach.    ACT Assessment Complete:  Yes:    Educational Status    Risk to Self:    Risk to Others:    Abuse: Abuse/Neglect Assessment (Assessment to be complete while patient is alone) Physical Abuse: Denies Verbal Abuse: Denies Sexual Abuse: Denies Exploitation of patient/patient's resources: Denies Self-Neglect: Denies  Prior  Inpatient Therapy:    Prior Outpatient Therapy:    Additional Information:                    Objective: Blood pressure 134/106, pulse 114, temperature 98.8 F (37.1 C), temperature source Oral, resp. rate 18, height _0  (1.626 m), weight 135 lb (61.236 kg), SpO2 98.00%.Body mass index is 23.16 kg/(m^2). Results for orders placed during the hospital encounter of 11/28/13 (from the past 72 hour(s))  ACETAMINOPHEN LEVEL     Status: None   Collection Time    11/28/13  5:56 PM      Result Value Ref Range   Acetaminophen (Tylenol), Serum <15.0  10 - 30 ug/mL    Comment:            THERAPEUTIC CONCENTRATIONS VARY     SIGNIFICANTLY. A RANGE OF 10-30     ug/mL MAY BE AN EFFECTIVE     CONCENTRATION FOR MANY PATIENTS.     HOWEVER, SOME ARE BEST TREATED     AT CONCENTRATIONS OUTSIDE THIS     RANGE.     ACETAMINOPHEN CONCENTRATIONS     >150 ug/mL AT 4 HOURS AFTER     INGESTION AND >50 ug/mL AT 12     HOURS AFTER INGESTION ARE     OFTEN ASSOCIATED WITH TOXIC     REACTIONS.  CBC     Status: None   Collection Time    11/28/13  5:56 PM      Result Value Ref Range   WBC 6.2  4.0 - 10.5 K/uL   RBC 4.15  3.87 - 5.11 MIL/uL   Hemoglobin 13.4  12.0 - 15.0 g/dL   HCT 38.9  36.0 - 46.0 %   MCV 93.7  78.0 - 100.0 fL   MCH 32.3  26.0 - 34.0 pg   MCHC 34.4  30.0 - 36.0 g/dL   RDW 13.4  11.5 - 15.5 %   Platelets 280  150 - 400 K/uL  COMPREHENSIVE METABOLIC PANEL     Status: Abnormal   Collection Time    11/28/13  5:56 PM      Result Value Ref Range   Sodium 133 (*) 137 - 147 mEq/L   Potassium 4.0  3.7 - 5.3 mEq/L   Chloride 94 (*) 96 - 112 mEq/L   CO2 23  19 - 32 mEq/L   Glucose, Bld 96  70 - 99 mg/dL   BUN 3 (*) 6 - 23 mg/dL   Creatinine, Ser 0.77  0.50 - 1.10 mg/dL   Calcium 10.2  8.4 - 10.5 mg/dL   Total Protein 7.7  6.0 - 8.3 g/dL   Albumin 4.3  3.5 - 5.2 g/dL   AST 30  0 - 37 U/L   ALT 15  0 - 35 U/L   Alkaline Phosphatase 73  39 - 117 U/L   Total Bilirubin 0.3  0.3 - 1.2 mg/dL   GFR calc non Af Amer >90  >90 mL/min   GFR calc Af Amer >90  >90 mL/min   Comment: (NOTE)     The eGFR has been calculated using the CKD EPI equation.     This calculation has not been validated in all clinical situations.     eGFR's persistently <90 mL/min signify possible Chronic Kidney     Disease.   Anion gap 16 (*) 5 - 15  ETHANOL     Status: None   Collection Time    11/28/13  5:56 PM      Result Value Ref Range   Alcohol, Ethyl (B) <11  0 - 11 mg/dL   Comment:            LOWEST DETECTABLE LIMIT FOR     SERUM ALCOHOL IS 11 mg/dL     FOR MEDICAL  PURPOSES ONLY  SALICYLATE LEVEL     Status: Abnormal   Collection Time    11/28/13  5:56 PM      Result Value Ref Range   Salicylate Lvl <2.6 (*) 2.8 - 20.0 mg/dL  URINE RAPID DRUG SCREEN (HOSP PERFORMED)     Status: Abnormal   Collection Time    11/28/13 10:59 PM      Result Value Ref Range   Opiates NONE DETECTED  NONE DETECTED   Cocaine NONE DETECTED  NONE DETECTED   Benzodiazepines NONE DETECTED  NONE DETECTED   Amphetamines POSITIVE (*) NONE DETECTED   Tetrahydrocannabinol NONE DETECTED  NONE DETECTED   Barbiturates NONE DETECTED  NONE DETECTED   Comment:            DRUG SCREEN FOR MEDICAL PURPOSES     ONLY.  IF CONFIRMATION IS NEEDED     FOR ANY PURPOSE, NOTIFY LAB     WITHIN 5 DAYS.                LOWEST DETECTABLE LIMITS     FOR URINE DRUG SCREEN     Drug Class       Cutoff (ng/mL)     Amphetamine      1000     Barbiturate      200     Benzodiazepine   834     Tricyclics       196     Opiates          300     Cocaine          300     THC              50  URINALYSIS, ROUTINE W REFLEX MICROSCOPIC     Status: None   Collection Time    11/28/13 10:59 PM      Result Value Ref Range   Color, Urine YELLOW  YELLOW   APPearance CLEAR  CLEAR   Specific Gravity, Urine 1.005  1.005 - 1.030   pH 7.0  5.0 - 8.0   Glucose, UA NEGATIVE  NEGATIVE mg/dL   Hgb urine dipstick NEGATIVE  NEGATIVE   Bilirubin Urine NEGATIVE  NEGATIVE   Ketones, ur NEGATIVE  NEGATIVE mg/dL   Protein, ur NEGATIVE  NEGATIVE mg/dL   Urobilinogen, UA 0.2  0.0 - 1.0 mg/dL   Nitrite NEGATIVE  NEGATIVE   Leukocytes, UA NEGATIVE  NEGATIVE   Comment: MICROSCOPIC NOT DONE ON URINES WITH NEGATIVE PROTEIN, BLOOD, LEUKOCYTES, NITRITE, OR GLUCOSE <1000 mg/dL.   Labs are reviewed and are pertinent for no medical issues noted.  No current facility-administered medications for this encounter.    Psychiatric Specialty Exam:     Blood pressure 134/106, pulse 114, temperature 98.8 F (37.1 C), temperature  source Oral, resp. rate 18, height _0  (1.626 m), weight 135 lb (61.236 kg), SpO2 98.00%.Body mass index is 23.16 kg/(m^2).  General Appearance: Disheveled  Eye Sport and exercise psychologist::  Fair  Speech:  Normal Rate  Volume:  Increased  Mood:  Angry, Anxious and Irritable  Affect:  Congruent  Thought Process:  Coherent  Orientation:  Full (Time, Place, and  Person)  Thought Content:  Hallucinations: Auditory Visual  Suicidal Thoughts:  No  Homicidal Thoughts:  No  Memory:  Immediate;   Fair Recent;   Fair Remote;   Fair  Judgement:  Poor  Insight:  Lacking  Psychomotor Activity:  Increased  Concentration:  Fair  Recall:  [employer]: Fair  Akathisia:  No  Handed:  Right  AIMS (if indicated):     Assets:  Catering manager Housing Leisure Time Physical Health Resilience Social Support  Sleep:      Musculoskeletal: Strength & Muscle Tone: within normal limits Gait & Station: normal Patient leans: N/A  Treatment Plan Summary: Daily contact with patient to assess and evaluate symptoms and progress in treatment Medication management; admit to inpatient unit at [facility] for stabilization  Waylan Boga, [place] 11/29/2013 4:03 PM

## 2013-11-29 NOTE — ED Notes (Signed)
Pt cursing but redirectable, as per NP Drenda FreezeFran , will monitor at present.

## 2013-11-29 NOTE — Progress Notes (Signed)
Patient ID: Tiffany [name], female   DOB: 06/16/1965, 48 y.o.   MRN: [id number] Admit Note. Patient presents for involuntary admission to adult unit. Patient poor historian and resistant during admission. Patient very agitated, tearful, responding to internal stimuli during admission process stating '' I know what this is all about, you are just out to make zombies out of people instead of them and I haven't taken a shower in 46 years I don't want to answer any more questions! '' Patient skin searched and no contraband found. Pt denies any SI/HI. Pt oriented to unit. Will continue to monitor q 15 minutes for safety.

## 2013-11-29 NOTE — Progress Notes (Signed)
Patient ID: Tiffany HickLaina M [name], female   DOB: 09-19-1965, 48 y.o.   MRN: [id number] Pt paranoid in hallway screaming at writer stating '' you call the ambulance now, i want you to call the ambulance I'm in pain and I'm going into early labor! What do you mean you'll talk to the fucking doctor!!! '' Patient very paranoid, agitated and disorganized. Notified Dr. Jannifer FranklinAkintayo of above information and orders received. Patient paranoid stating  '' I don't trust me . I want this guy to do it'' Patient did accept injections from second RN without issue. Will continue to monitor q 15 minutes for safety.

## 2013-11-30 ENCOUNTER — Encounter (HOSPITAL_COMMUNITY): Payer: Self-pay | Admitting: Psychiatry

## 2013-11-30 DIAGNOSIS — F3110 Bipolar disorder, current episode manic without psychotic features, unspecified: Principal | ICD-10-CM

## 2013-11-30 DIAGNOSIS — F319 Bipolar disorder, unspecified: Secondary | ICD-10-CM | POA: Diagnosis present

## 2013-11-30 DIAGNOSIS — F1994 Other psychoactive substance use, unspecified with psychoactive substance-induced mood disorder: Secondary | ICD-10-CM | POA: Diagnosis present

## 2013-11-30 MED ORDER — HALOPERIDOL LACTATE 5 MG/ML IJ SOLN: 5.0000 mg | Freq: Three times a day (TID) | INTRAMUSCULAR | Status: DC | PRN

## 2013-11-30 MED ORDER — OLANZAPINE 10 MG PO TBDP: 5.0000 mg | ORAL_TABLET | Freq: Three times a day (TID) | ORAL | Status: DC | PRN

## 2013-11-30 MED ORDER — OLANZAPINE 10 MG IM SOLR
5.0000 mg | Freq: Three times a day (TID) | INTRAMUSCULAR | Status: DC | PRN
Start: 2013-11-30 — End: 2013-11-30

## 2013-11-30 MED ORDER — CITALOPRAM HYDROBROMIDE 40 MG PO TABS
40.0000 mg | ORAL_TABLET | Freq: Every day | ORAL | Status: DC
  Administered 2013-11-30 – 2013-12-04 (×5): 40 mg via ORAL
  Filled 2013-11-30: qty 1
  Filled 2013-11-30: qty 2
  Filled 2013-11-30 (×2): qty 1
  Filled 2013-11-30: qty 14
  Filled 2013-11-30 (×3): qty 1

## 2013-11-30 MED ORDER — LORAZEPAM 1 MG PO TABS
1.0000 mg | ORAL_TABLET | Freq: Three times a day (TID) | ORAL | Status: DC | PRN
  Administered 2013-11-30 – 2013-12-03 (×9): 1 mg via ORAL
  Filled 2013-11-30 (×10): qty 1

## 2013-11-30 MED ORDER — OLANZAPINE 5 MG PO TBDP
5.0000 mg | ORAL_TABLET | Freq: Two times a day (BID) | ORAL | Status: DC
  Administered 2013-11-30 – 2013-12-03 (×7): 5 mg via ORAL
  Filled 2013-11-30 (×13): qty 1

## 2013-11-30 MED ORDER — HYDROXYZINE HCL 25 MG PO TABS
25.0000 mg | ORAL_TABLET | Freq: Four times a day (QID) | ORAL | Status: DC | PRN
  Administered 2013-12-03 – 2013-12-04 (×3): 25 mg via ORAL
  Filled 2013-11-30 (×2): qty 1
  Filled 2013-11-30: qty 56
  Filled 2013-11-30: qty 1

## 2013-11-30 MED ORDER — HALOPERIDOL 5 MG PO TABS
5.0000 mg | ORAL_TABLET | Freq: Three times a day (TID) | ORAL | Status: DC | PRN
  Administered 2013-12-01: 5 mg via ORAL
  Filled 2013-11-30: qty 1

## 2013-11-30 MED ORDER — CARBAMAZEPINE 200 MG PO TABS
200.0000 mg | ORAL_TABLET | Freq: Two times a day (BID) | ORAL | Status: DC
  Administered 2013-11-30 – 2013-12-04 (×9): 200 mg via ORAL
  Filled 2013-11-30 (×8): qty 1
  Filled 2013-11-30: qty 28
  Filled 2013-11-30 (×3): qty 1
  Filled 2013-11-30: qty 28
  Filled 2013-11-30 (×2): qty 1

## 2013-11-30 MED ORDER — CITALOPRAM HYDROBROMIDE 40 MG PO TABS: 40.0000 mg | ORAL_TABLET | Freq: Every day | ORAL | Status: DC

## 2013-11-30 MED ORDER — LORAZEPAM 1 MG PO TABS: 1.0000 mg | ORAL_TABLET | Freq: Four times a day (QID) | ORAL | Status: DC | PRN

## 2013-11-30 MED ORDER — LAMOTRIGINE 25 MG PO TABS
25.0000 mg | ORAL_TABLET | Freq: Two times a day (BID) | ORAL | Status: DC
  Filled 2013-11-30 (×4): qty 1

## 2013-11-30 MED ORDER — TRAZODONE HCL 150 MG PO TABS: 150.0000 mg | ORAL_TABLET | Freq: Every day | ORAL | Status: DC

## 2013-11-30 NOTE — Progress Notes (Signed)
Patient ID: Tiffany [name], female   DOB: 03-Mar-1966, 48 y.o.   MRN: [id number] Psychoeducational Group Note  Date:  11/30/2013 Time:0930am  Group Topic/Focus:  Identifying Needs:   The focus of this group is to help patients identify their personal needs that have been historically problematic and identify healthy behaviors to address their needs.  Participation Level:  Minimal  Participation Quality:  Redirectable  Affect:  Irritable  Cognitive:  Disorganized  Insight:  Limited  Engagement in Group:  Limited  Additional Comments:  Healthy coping skills.   Valente DavidWeaver, Tiffany [name] 11/30/2013,1:03 PM
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
## 2013-11-30 NOTE — Progress Notes (Signed)
[facility] Group Notes:  (Nursing/MHT/Case Management/Adjunct)  Date:  11/30/2013  Time:  3:09 AM  Type of Therapy:  Psychoeducational Skills  Participation Level:  Active  Participation Quality:  Attentive  Affect:  Depressed  Cognitive:  Lacking  Insight:  Lacking  Engagement in Group:  Off Topic  Modes of Intervention:  Education  Summary of Progress/Problems: The patient shared in group that she was concerned about the injection that she received earlier in the day. She feels very drowsy at this time. In terms of relapse prevention strategies, she intends to move.   Sunni Richardson S 11/30/2013, 3:09 AM

## 2013-11-30 NOTE — Progress Notes (Signed)
D. Pt up and visible in milieu this evening, attended and participated in wrap-up group. Pt did complain of anxiety and requested and received PRN ativan. Pt spoke about how she is feeling better today and does not feel as confused today and spoke about how the ativan has helped to clear her mind.  A. Support and encouragement provided, medication education provided. R. Pt verbalized understanding, safety maintained.

## 2013-11-30 NOTE — Progress Notes (Addendum)
Patient continue to complain about the IM injection she got during the day. Patient stated " I don't like this needle they gave me. I don't know why they gave me that injection" patient demanded answer from this writer what the injection was for and why she got this injection. This Clinical research associatewriter explained to Patient the injection she got and why she got those medications. Patient refused Ribavirin stated " I am taking no pills anymore. I need to talk to my doctor tomorrow  Before taking any medication beside  Have stopped taking that meds. Long time ago. Will continue to monitor patient.   At 3:14 am, patient requested an received Ativan 1 mg for agitation. Med was effective. Will continue to monitor patient.

## 2013-11-30 NOTE — Progress Notes (Signed)
Patient ID: Tiffany [name], female   DOB: 1965-09-25, 48 y.o.   MRN: [id number] D. Patient presents with irritable mood, affect labile. Rudi HeapLaina continues to be very paranoid, anxious and guarded. This morning patient pacing hallways and staring at times. She continues to have disorganized thinking, mumbling to self at times. She denies any AH/VH, denies SI/HI. She complained of anxiety this am, requesting ativan when writer offered prn injectable patient became very paranoid screaming stating '' no fucking way get the fuck away from me! ''  Pt very focused on her ativan and times available. A. Discussed above information with Dr. Ander GasterLugo/Conrad NP. R. Patient is visible on the unit. No further voiced concerns at this time. Will continue to monitor q 15 minutes for safety.

## 2013-11-30 NOTE — Progress Notes (Signed)
Patient ID: Jannifer HickLaina M Brackney, female   DOB: [birth year], 48 y.o.   MRN: [id number] Psychoeducational Group Note  Date:  11/30/2013 Time:0900am  Group Topic/Focus:  Identifying Needs:   The focus of this group is to help patients identify their personal needs that have been historically problematic and identify healthy behaviors to address their needs.  Participation Level:  Minimal  Participation Quality:  Redirectable  Affect:  Irritable  Cognitive:  Disorganized  Insight:  Limited  Engagement in Group:  Limited  Additional Comments:  Inventory group   Valente DavidWeaver, Herbie Lehrmann Brooks 11/30/2013,1:02 PM
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
## 2013-11-30 NOTE — [facility] Suicide Risk Assessment (Signed)
Suicide Risk Assessment  Admission Assessment     Nursing information obtained from:    Demographic factors:    Current Mental Status:    Loss Factors:    Historical Factors:    Risk Reduction Factors:    Total Time spent with patient: 45 minutes  CLINICAL FACTORS:   Bipolar Disorder:   Mixed State  Psychiatric Specialty Exam:     Blood pressure 104/76, pulse 101, temperature 98.1 F (36.7 C), temperature source Oral, resp. rate 16, height 5\' 4"  (1.626 m), weight 61.236 kg (135 lb), SpO2 98.00%.Body mass index is 23.16 kg/(m^2).  General Appearance: Fairly Groomed  Patent attorneyye Contact::  Fair  Speech:  Clear and Coherent  Volume:  Normal  Mood:  Anxious and worried  Affect:  anxious worried  Thought Process:  Coherent and Goal Directed  Orientation:  Full (Time, Place, and Person)  Thought Content:  history events worries concerns  Suicidal Thoughts:  No  Homicidal Thoughts:  No  Memory:  Immediate;   Fair Recent;   Fair Remote;   Fair  Judgement:  Fair  Insight:  Present and Shallow  Psychomotor Activity:  Restlessness  Concentration:  Fair  Recall:  [employer]  Fund of Knowledge:NA  Language: Fair  Akathisia:  No  Handed:    AIMS (if indicated):     Assets:  Desire for Improvement Housing Social Support  Sleep:  Number of Hours: 5.25   Musculoskeletal: Strength & Muscle Tone: within normal limits Gait & Station: normal Patient leans: N/A  COGNITIVE FEATURES THAT CONTRIBUTE TO RISK:  Closed-mindedness Polarized thinking Thought constriction (tunnel vision)    SUICIDE RISK:   Mild:   PLAN OF CARE: Supportive approach/coping skills                              Get more information                              Simplify medication regime/D/C the stimulants                              Work with mood stabilizers  I certify that inpatient services furnished can reasonably be expected to improve the patient's condition.  Martisha Toulouse A 11/30/2013, 4:19 PM

## 2013-11-30 NOTE — [facility] Group Notes (Signed)
[facility] Group Notes:  (Clinical Social Work)  11/30/2013  11:15AM - 12:00PM  Summary of Progress/Problems:   The main focus of today's process group was to discuss feelings related to being hospitalized, as well as the difference between "being" and "having" a mental health diagnosis.  It was agreed in general by the group that it would be preferable to avoid future hospitalizations, and we discussed means of doing that.  As a follow-up, problems with adhering to medication recommendations were discussed.  The patient expressed little, as she was in and out of group a few times.  She did state that if she has side effects to a medication, she calls the doctor.  Type of Therapy:  Group Therapy - Process  Participation Level:  Minimal  Participation Quality:  In and out of room  Affect:  Blunted and Depressed  Cognitive:  Could not be assessed  Insight:  Improving  Engagement in Therapy:  Limited  Modes of Intervention:  Exploration, Discussion  Ambrose MantleMareida Grossman-Orr, LCSW 11/30/2013, 12:30

## 2013-11-30 NOTE — Progress Notes (Signed)
The focus of this group is to help patients review their daily goal of treatment and discuss progress on daily workbooks. Pt attended the evening group session and responded to all discussion prompts from the Writer. Pt shared that today was a good day on the unit, the highlight of which was feeling better, which Pt described as having "a clearer mind." Pt told the group she was eager to get home in order to be with her dog whom she left alone. Pt did say that a neighbor was taking care of her pet as of this morning in her absence. Pt also told the group she was recently hired to a part-time job and was eager to discharge home so that she could begin that. Pt's affect was appropriate.

## 2013-12-01 DIAGNOSIS — F22 Delusional disorders: Secondary | ICD-10-CM

## 2013-12-01 DIAGNOSIS — F3130 Bipolar disorder, current episode depressed, mild or moderate severity, unspecified: Secondary | ICD-10-CM

## 2013-12-01 MED ORDER — OLANZAPINE 5 MG PO TBDP
5.0000 mg | ORAL_TABLET | Freq: Every day | ORAL | Status: DC
  Administered 2013-12-01 – 2013-12-03 (×3): 5 mg via ORAL
  Filled 2013-12-01 (×5): qty 1

## 2013-12-01 MED ORDER — MAGNESIUM HYDROXIDE 400 MG/5ML PO SUSP: 30.0000 mL | Freq: Every day | ORAL | Status: DC | PRN

## 2013-12-01 MED ORDER — BENZOCAINE 10 % MT GEL
Freq: Four times a day (QID) | OROMUCOSAL | Status: DC | PRN
  Filled 2013-12-01: qty 9.4

## 2013-12-01 NOTE — Progress Notes (Signed)
Patient ID: Tiffany [name] Reasons, female   DOB: [birth year], 48 y.o.   MRN: [id number] D. Patient presents with irritable mood, affect blunted.  Rudi HeapLaina continues to seem anxious and suspicious but her thoughts and speech are more logical today. Pt states '' I'[name] doing so much better today, the medications are helping my racing thoughts. And my anxiety. '' She states '' i am worried about my dog though, he is at home. '' A. Support and encouragement provided. Medications given as ordered.. R. Patient is visible on the unit. No further voiced concerns at this time. Will continue to monitor q 15 minutes for safety.

## 2013-12-01 NOTE — Progress Notes (Signed)
Nutrition Brief Note  Patient identified on the Malnutrition Screening Tool (MST) Report  Wt Readings from Last 15 Encounters:  11/29/13 135 lb (61.236 kg)  01/09/13 141 lb 1.5 oz (64 kg)  03/25/11 134 lb (60.782 kg)  03/23/11 129 lb 8 oz (58.741 kg)  12/26/10 135 lb (61.236 kg)  08/21/07 125 lb 4 oz (56.813 kg)  05/18/07 131 lb 8 oz (59.648 kg)  03/05/07 134 lb 8 oz (61.009 kg)    Body mass index is 23.16 kg/(m^2). Patient meets criteria for normal body weight based on current BMI.   Current diet order is regular, patient is consuming approximately 100% of meals at this time. Labs and medications reviewed.   Pt reports no recent wt loss or changes in appetite. She is eating well at this time.  No nutrition interventions warranted at this time. If nutrition issues arise, please consult RD.   Ebbie LatusHaley Hawkins RD, LDN

## 2013-12-01 NOTE — [facility] Group Notes (Signed)
[facility] Group Notes:  (Nursing/MHT/Case Management/Adjunct)  Date:  12/01/2013  Time:  11:55 AM  Type of Therapy:  Psychoeducational Skills  Participation Level:  Minimal  Participation Quality:  Appropriate and Attentive  Affect:  Blunted  Cognitive:  Appropriate  Insight:  Limited  Engagement in Group:  Limited  Modes of Intervention:  Activity, Discussion and Education  Summary of Progress/Problems: Pt was appropriate in sharing with the group.      Jule SerKent, Anea Fodera Gail 12/01/2013, 11:55 AM

## 2013-12-01 NOTE — [facility] Group Notes (Signed)
0900 nursing orientation group   The focus of this group is to educate the patient on the purpose and policies of crisis stabilization and provide a format to answer questions about their admission.  The group details unit policies and expectations of patients while admitted.  Pt was an active participant she was appropriate in sharing with the group minimal interactions

## 2013-12-01 NOTE — [facility] Group Notes (Signed)
[facility] Group Notes:  (Clinical Social Work)  12/01/2013   11:00am-12:00pm  Summary of Progress/Problems:  The main focus of today's process group was to listen to a variety of genres of music and to identify that different types of music provoke different responses.  The patient then was able to identify personally what was soothing for them, as well as energizing.  Handouts were used to record feelings evoked, as well as how patient can personally use this knowledge in sleep habits, with depression, and with other symptoms.  The patient expressed understanding of concepts, as well as knowledge of how each type of music affected him/her and how this can be used at home as a wellness/recovery tool.  She quietly listened the whole time, and was engaged with the music, disengaged from the drama going on in the group setting.  Type of Therapy:  Music Therapy   Participation Level:  Active  Participation Quality:  Attentive and Sharing  Affect:  Blunted  Cognitive:  Oriented  Insight:  Engaged  Engagement in Therapy:  Engaged  Modes of Intervention:   Activity, Exploration  Ambrose MantleMareida Grossman-Orr, LCSW 12/01/2013, 12:30pm

## 2013-12-01 NOTE — Progress Notes (Addendum)
[facility] MD Progress Note  12/01/2013 3:02 PM Tiffany [name]  MRN:  [id number] Subjective: Tiffany [name] states she experiences thoughts, voices inside her head. They occur specially in the morning. They are irrational thoughts having to do with her family as an example that her father and her are twins. States when she takes the Adderall in the morning she can concentrate and ignore the thoughts. Otherwise she keeps thinking about them. States they have been going on for years. She states this episode has been going on since July. Cant identify a trigger. Does say she thinks she is possessed. States she needs to find the right kind of pastor to discuss this, as last time she tried it did not worked out. She is wanting Adderall so she can focus and ignore the thoughts Diagnosis:   DSM5: Depressive Disorders:  Major Depressive Disorder - Moderate (296.22) Total Time spent with patient: 30 minutes  Axis I: Bipolar, Depressed , Delusional Disorder  ADL's:  Intact  Sleep: Fair  Appetite:  Fair  Psychiatric Specialty Exam: Physical Exam  Review of Systems  Constitutional: Negative.   HENT: Negative.        Pain in an area lateral to the tongue left side  Eyes: Negative.   Respiratory: Negative.   Cardiovascular: Negative.   Gastrointestinal: Positive for melena.  Genitourinary: Negative.   Musculoskeletal: Negative.   Skin: Negative.   Neurological: Negative.   Endo/Heme/Allergies: Negative.   Psychiatric/Behavioral: The patient is nervous/anxious and has insomnia.     Blood pressure 124/89, pulse 87, temperature 98.6 F (37 C), temperature source Oral, resp. rate 16, height 5\' 4"  (1.626 m), weight 61.236 kg (135 lb), SpO2 98.00%.Body mass index is 23.16 kg/(m^2).  General Appearance: Fairly Groomed  Patent attorney::  Fair  Speech:  Clear and Coherent  Volume:  Normal  Mood:  Anxious and worried, wanting to have Adderall  Affect:  Restricted  Thought Process:  Coherent and Goal Directed   Orientation:  Full (Time, Place, and Person)  Thought Content:  symptoms events worries concerns, delusional ideas  Suicidal Thoughts:  No  Homicidal Thoughts:  No  Memory:  Immediate;   Fair Recent;   Fair Remote;   Fair  Judgement:  Fair  Insight:  Lacking  Psychomotor Activity:  Restlessness  Concentration:  Fair  Recall:  [employer] of Knowledge:NA  Language: Fair  Akathisia:  No  Handed:    AIMS (if indicated):     Assets:  Desire for Improvement  Sleep:  Number of Hours: 5.25   Musculoskeletal: Strength & Muscle Tone: within normal limits Gait & Station: normal Patient leans: N/A  Current Medications: Current Facility-Administered Medications  Medication Dose Route Frequency Provider Last Rate Last Dose  . acetaminophen (TYLENOL) tablet 650 mg  650 mg Oral Q4H PRN Nanine Means, NP      . alum & mag hydroxide-simeth (MAALOX/MYLANTA) 200-200-20 MG/5ML suspension 30 mL  30 mL Oral PRN Nanine Means, NP      . carbamazepine (TEGRETOL) tablet 200 mg  200 mg Oral BID Beau Fanny, FNP   200 mg at 12/01/13 0750  . citalopram (CELEXA) tablet 40 mg  40 mg Oral Daily Beau Fanny, FNP   40 mg at 12/01/13 0749  . haloperidol (HALDOL) tablet 5 mg  5 mg Oral Q8H PRN Beau Fanny, FNP   5 mg at 12/01/13 0749   Or  . haloperidol lactate (HALDOL) injection 5 mg  5 mg Intramuscular Q8H PRN Jonny Ruiz  Chipper Herb Withrow, FNP      . hydrOXYzine (ATARAX/VISTARIL) tablet 25 mg  25 mg Oral Q6H PRN Beau FannyJohn C Withrow, FNP      . LORazepam (ATIVAN) tablet 1 mg  1 mg Oral TID PRN Beau FannyJohn C Withrow, FNP   1 mg at 12/01/13 1331  . magnesium hydroxide (MILK OF MAGNESIA) suspension 30 mL  30 mL Oral Daily PRN Beau FannyJohn C Withrow, FNP      . naproxen (NAPROSYN) tablet 500 mg  500 mg Oral BID WC Nanine MeansJamison Lord, NP   500 mg at 12/01/13 0749  . nicotine (NICODERM CQ - dosed in mg/24 hours) patch 21 mg  21 mg Transdermal Daily Nanine MeansJamison Lord, NP   21 mg at 12/01/13 0751  . OLANZapine zydis (ZYPREXA) disintegrating tablet 5 mg   5 mg Oral BID Beau FannyJohn C Withrow, FNP   5 mg at 12/01/13 0750  . ondansetron (ZOFRAN) tablet 4 mg  4 mg Oral Q8H PRN Nanine MeansJamison Lord, NP      . pantoprazole (PROTONIX) EC tablet 80 mg  80 mg Oral Daily Nanine MeansJamison Lord, NP   80 mg at 12/01/13 0749  . traZODone (DESYREL) tablet 150 mg  150 mg Oral QHS Nanine MeansJamison Lord, NP        Lab Results: No results found for this or any previous visit (from the past 48 hour(s)).  Physical Findings: AIMS: Facial and Oral Movements Muscles of Facial Expression: None, normal Lips and Perioral Area: None, normal Jaw: None, normal Tongue: None, normal,Extremity Movements Upper (arms, wrists, hands, fingers): None, normal Lower (legs, knees, ankles, toes): None, normal, Trunk Movements Neck, shoulders, hips: None, normal, Overall Severity Severity of abnormal movements (highest score from questions above): None, normal Incapacitation due to abnormal movements: None, normal, Dental Status Current problems with teeth and/or dentures?: No Does patient usually wear dentures?: No  CIWA:  CIWA-Ar Total: 0 COWS:  COWS Total Score: 0  Treatment Plan Summary: Daily contact with patient to assess and evaluate symptoms and progress in treatment Medication management  Plan: Supportive approach/coping skills/improve reality testing           Optimize treatment with psychotropics ( will increase the Zyprexa Zydis  to 5 mg BID and HS)           Get collateral information Medical Decision Making Problem Points:  Review of psycho-social stressors (1) Data Points:  Review of medication regiment & side effects (2) Review of new medications or change in dosage (2)  I certify that inpatient services furnished can reasonably be expected to improve the patient's condition.   Ladarien Beeks A 12/01/2013, 3:02 PM

## 2013-12-01 NOTE — Progress Notes (Signed)
Patient ID: [name], female   DOB: 04/15/1966, 48 y.o.   MRN: [id number] D: Client in bed, eyes closed, respirations even. A: Writer assessed for s/s of distress. Staff will monitor q5615min for safety. R: Client is safe on the unit, no distress noted, respirations unlabored.

## 2013-12-02 DIAGNOSIS — F315 Bipolar disorder, current episode depressed, severe, with psychotic features: Secondary | ICD-10-CM

## 2013-12-02 NOTE — [facility] Group Notes (Signed)
[facility]   12/02/2013 10:07 AM    Participation Quality:  Appropraite  Mood/Affect:  Appropriate  Depression Rating:  0   Anxiety Rating:  0  Thoughts of Suicide:  No  Will you contract for safety?   NA  Current AVH:  No  Plan for Discharge/Comments:  Patient attended discharge planning group and actively participated in group.  Patient advised of seeing [facility] for counseling and medication management.   CSW provided all participants with daily workbook.   Transportation Means: Patient has transportation.   Supports:  Patient has a support system.   Maicy Filip, Joesph JulyQuylle Hairston

## 2013-12-02 NOTE — [facility] Group Notes (Signed)
[facility]   12/02/2013 11:38 AM    Participation Quality:  Appropraite  Mood/Affect:  Appropriate  Depression Rating:  0  Anxiety Rating:  3  Thoughts of Suicide:  No  Will you contract for safety?   NA  Current AVH:  No  Plan for Discharge/Comments:  Patient attended discharge planning group and actively participated in group.  Patient reports she is doing much better.  She advised of Auditory Hallucinations prior to admission but reports none at this time.  She is followed by [facility] for outpatient services.  CSW provided all participants with daily workbook.   Transportation Means: Patient has transportation.   Supports:  Patient has a support system.   Tiffany [name], Tiffany [name]

## 2013-12-02 NOTE — [facility] Group Notes (Signed)
[facility] LCSW Group Therapy          Overcoming Obstacles       1:15 -2:30        12/02/2013       Type of Therapy:  Group Therapy  Participation Level:  Appropriate  Participation Quality:  Appropriate  Affect:  Appropriate, Alert  Cognitive:  Attentive Appropriate  Insight: Developing/Improving   Engagement in Therapy: Developing/Imprvoing   Modes of Intervention:  Discussion Exploration  Education Rapport BuildingProblem-Solving Support  Summary of Progress/Problems:  The main focus of today's group was overcoming obstacles.  She shared her obstacle is her finances.  She talked about how she spends too much money.     Patient able to identify appropriate coping skills.   Tiffany [name], Tiffany [name] 12/02/2013

## 2013-12-02 NOTE — Progress Notes (Signed)
[facility] MD Progress Note  12/02/2013 5:04 PM Tiffany [name]  MRN:  [id number] Subjective: Tiffany [name] states she feels she is doing better than when she came in but reports she continues to have problems concentrating. Reports that she used to see Dr.Reddy who gave her Adderral and she wants to be on it since that will help her concentrate. She reports that she came in since she had feelings of being possessed and felt that they were trapped in her. Also reports that she wants to be discharged soon since otherwise she will have trouble paying her utility bills and that can cause her problems.  Objective. Patient seen and chart reviewed. She appears to be anxious,with some thought blocking as well as slowed speech. Denies SI/AH/VH at this time but continues to have trouble focusing. Reports mood as better.  Diagnosis:   DSM5: Bipolar disorder ,type I,Current episode depressed with psychotic features  Non psychiatric diagnosis GERD (gastroesophageal reflux disease)  Hepatitis C per hx Herpes simplex virus infection per hx    Total Time spent with patient: 20 minutes   ADL's:  Intact  Sleep: Fair  Appetite:  Fair  Psychiatric Specialty Exam: Physical Exam  Review of Systems  Constitutional: Negative.   Eyes: Negative.   Respiratory: Negative.   Cardiovascular: Negative.   Genitourinary: Negative.   Musculoskeletal: Negative.   Skin: Negative.   Neurological: Negative.   Endo/Heme/Allergies: Negative.   Psychiatric/Behavioral: The patient is nervous/anxious and has insomnia.     Blood pressure 151/98, pulse 85, temperature 97.8 F (36.6 C), temperature source Oral, resp. rate 16, height 5\' 4"  (1.626 m), weight 61.236 kg (135 lb), SpO2 98.00%.Body mass index is 23.16 kg/(m^2).  General Appearance: Fairly Groomed  Patent attorney::  Fair  Speech:  Clear and Coherent  Volume:  Normal  Mood:  Anxious and worried, wanting to have Adderall  Affect:  Restricted  Thought Process:  Coherent and  Goal Directed  Orientation:  Full (Time, Place, and Person)  Thought Content:  symptoms events worries concerns, delusional ideas  Suicidal Thoughts:  No  Homicidal Thoughts:  No  Memory:  Immediate;   Fair Recent;   Fair Remote;   Fair  Judgement:  Fair  Insight:  Lacking  Psychomotor Activity:  Increased  Concentration:  Fair  Recall:  [employer] of Knowledge:NA  Language: Fair  Akathisia:  No  Handed:    AIMS (if indicated):     Assets:  Desire for Improvement  Sleep:  Number of Hours: 5.75   Musculoskeletal: Strength & Muscle Tone: within normal limits Gait & Station: normal Patient leans: N/A  Current Medications: Current Facility-Administered Medications  Medication Dose Route Frequency Provider Last Rate Last Dose  . acetaminophen (TYLENOL) tablet 650 mg  650 mg Oral Q4H PRN Nanine Means, NP      . alum & mag hydroxide-simeth (MAALOX/MYLANTA) 200-200-20 MG/5ML suspension 30 mL  30 mL Oral PRN Nanine Means, NP      . benzocaine (ORAJEL) 10 % mucosal gel   Mouth/Throat QID PRN Rachael Fee, MD      . carbamazepine (TEGRETOL) tablet 200 mg  200 mg Oral BID Beau Fanny, FNP   200 mg at 12/02/13 0800  . citalopram (CELEXA) tablet 40 mg  40 mg Oral Daily Beau Fanny, FNP   40 mg at 12/02/13 0800  . haloperidol (HALDOL) tablet 5 mg  5 mg Oral Q8H PRN Beau Fanny, FNP   5 mg at 12/01/13 [phone]  Or  . haloperidol lactate (HALDOL) injection 5 mg  5 mg Intramuscular Q8H PRN Beau FannyJohn C Withrow, FNP      . hydrOXYzine (ATARAX/VISTARIL) tablet 25 mg  25 mg Oral Q6H PRN Beau FannyJohn C Withrow, FNP      . LORazepam (ATIVAN) tablet 1 mg  1 mg Oral TID PRN Beau FannyJohn C Withrow, FNP   1 mg at 12/02/13 1442  . magnesium hydroxide (MILK OF MAGNESIA) suspension 30 mL  30 mL Oral Daily PRN Beau FannyJohn C Withrow, FNP      . naproxen (NAPROSYN) tablet 500 mg  500 mg Oral BID WC Nanine MeansJamison Lord, NP   500 mg at 12/02/13 0759  . nicotine (NICODERM CQ - dosed in mg/24 hours) patch 21 mg  21 mg Transdermal Daily  Nanine MeansJamison Lord, NP   21 mg at 12/02/13 0803  . OLANZapine zydis (ZYPREXA) disintegrating tablet 5 mg  5 mg Oral BID Beau FannyJohn C Withrow, FNP   5 mg at 12/02/13 0759  . OLANZapine zydis (ZYPREXA) disintegrating tablet 5 mg  5 mg Oral QHS Rachael FeeIrving A Lugo, MD   5 mg at 12/01/13 2157  . ondansetron (ZOFRAN) tablet 4 mg  4 mg Oral Q8H PRN Nanine MeansJamison Lord, NP      . pantoprazole (PROTONIX) EC tablet 80 mg  80 mg Oral Daily Nanine MeansJamison Lord, NP   80 mg at 12/02/13 0800  . traZODone (DESYREL) tablet 150 mg  150 mg Oral QHS Nanine MeansJamison Lord, NP        Lab Results: No results found for this or any previous visit (from the past 48 hour(s)).  Physical Findings: AIMS: Facial and Oral Movements Muscles of Facial Expression: None, normal Lips and Perioral Area: None, normal Jaw: None, normal Tongue: None, normal,Extremity Movements Upper (arms, wrists, hands, fingers): None, normal Lower (legs, knees, ankles, toes): None, normal, Trunk Movements Neck, shoulders, hips: None, normal, Overall Severity Severity of abnormal movements (highest score from questions above): None, normal Incapacitation due to abnormal movements: None, normal Patient's awareness of abnormal movements (rate only patient's report): No Awareness, Dental Status Current problems with teeth and/or dentures?: No Does patient usually wear dentures?: No  CIWA:  CIWA-Ar Total: 0 COWS:  COWS Total Score: 0  Treatment Plan Summary: Daily contact with patient to assess and evaluate symptoms and progress in treatment Medication management  Plan: Supportive approach/coping skills/improve reality testing           Optimize treatment with psychotropics ( will increase the Zyprexa Zydis  to 5 mg BID and HS)           Will work on disposition.   Medical Decision Making Problem Points:  Review of psycho-social stressors (1) Data Points:  Review of medication regiment & side effects (2) Review of new medications or change in dosage (2)  I certify that  inpatient services furnished can reasonably be expected to improve the patient's condition.   Inice Sanluis 12/02/2013, 5:04 PM

## 2013-12-02 NOTE — Plan of Care (Signed)
Problem: Ineffective individual coping Goal: LTG: Patient will report a decrease in negative feelings Outcome: Progressing Pt reports she is feeling better with no hallucinations today. Goal: STG: Patient will remain free from self harm Outcome: Progressing Pt denies si

## 2013-12-02 NOTE — Progress Notes (Signed)
D   Pt is pleasant on approach   She endorses depression and anxiety   She denies suicidal ideation at present and does contract for safety if having suicidal thoughts  She did say she left grief and loss group because one patient dominated group and she got bored with it    Pt does report feeling better since coming to the hospital and she thinks she is progressing well    A   Verbal support given   Medications administered and effectiveness monitored   Q  15 min checks R    Pt safe at present

## 2013-12-02 NOTE — Care Management Utilization Note (Signed)
Per State Regulation 482.30  The chart was reviewed for necessity with respect to the patient's Admission/ Duration of stay. 11/29/13  Next Review Date: 12/02/13  Lacinda AxonAlice Messiah Ahr, RN, BSN

## 2013-12-02 NOTE — Progress Notes (Signed)
D:Pt reports that she is anxious and is requesting prn ativan. Pt's affect is blunted as she requests medication. She reports sleeping through group. When asked if medications are making her sleepy,she responded "no." A:Gave medication as ordered. Offered support and 15 minute checks.  R:Pt denies si and hi. Pt denies hallucinations. Safety maintained on the unit.

## 2013-12-02 NOTE — [facility] Suicide Risk Assessment (Signed)
[facility] INPATIENT:  Family/Significant Other Suicide Prevention Education  Suicide Prevention Education:  Patient Refusal for Family/Significant Other Suicide Prevention Education: The patient Tiffany [name] has refused to provide written consent for family/significant other to be provided Family/Significant Other Suicide Prevention Education during admission and/or prior to discharge.  Physician notified.  Tiffany [name], Tiffany [name] 12/02/2013, 11:38 AM

## 2013-12-02 NOTE — Care Management Utilization Note (Signed)
Per State Regulation 482.30  The chart was reviewed for necessity with respect to the patient's Admission/ Duration of stay.12/02/13  Next Review Date:12/05/13  Lacinda AxonAlice Janaiya Beauchesne, RN, BSN

## 2013-12-02 NOTE — Progress Notes (Signed)
NSG shift assessment. 7a-7p.   D: Affect blunted, mood depressed, anxious and irritabe, behavior appropriate. Attends groups and participates. Cooperative with staff and is getting along well with peers. Pt's chief concern today is that she wants to be taking Adderal to help her focus. At home she takes 15 mg at 8 am and 2 pm.   A: Observed pt interacting in group and in the milieu: Support and encouragement offered. Safety maintained with observations every 15 minutes.   R:   Contracts for safety and continues to follow the treatment plan, working on learning new coping skills.

## 2013-12-03 DIAGNOSIS — F1994 Other psychoactive substance use, unspecified with psychoactive substance-induced mood disorder: Secondary | ICD-10-CM

## 2013-12-03 DIAGNOSIS — F319 Bipolar disorder, unspecified: Secondary | ICD-10-CM

## 2013-12-03 DIAGNOSIS — F1910 Other psychoactive substance abuse, uncomplicated: Secondary | ICD-10-CM

## 2013-12-03 MED ORDER — OLANZAPINE 5 MG PO TBDP
5.0000 mg | ORAL_TABLET | Freq: Three times a day (TID) | ORAL | Status: DC
  Administered 2013-12-04: 5 mg via ORAL
  Filled 2013-12-03 (×4): qty 1

## 2013-12-03 NOTE — Consult Note (Signed)
Patient seen, evaluated by me, treatment plan formulated by me. Patient is irritable, talking to herself and needs inpatient psychiatric care for stabilization

## 2013-12-03 NOTE — Progress Notes (Signed)
Recreation Therapy Notes   Animal-Assisted Activity/Therapy (AAA/T) Program Checklist/Progress Notes Patient Eligibility Criteria Checklist & Daily Group note for Rec Tx Intervention  Date: 08.18.2015 Time: 2:45pm [address]    AAA/T Program Assumption of Risk Form signed by Patient/ or Parent Legal Guardian yes  Patient is free of allergies or sever asthma yes  Patient reports no fear of animals yes  Patient reports no history of cruelty to animals yes   Patient understands his/her participation is voluntary yes  Patient washes hands before animal contact yes  Patient washes hands after animal contact yes  Behavioral Response: Appropriate, Tearful   Education: Hand Washing, Appropriate Animal Interaction   Education Outcome: Acknowledges understanding  Clinical Observations/Feedback: Patient interacted appropriately with therapeutic dog team and peers, additionally patient shared stories about her pets at home.  While sharing stories, patient became tearful.   Marykay Lexenise L Jerusha Reising, LRT/CTRS  Jearl KlinefelterBlanchfield, Cerina Leary L 12/03/2013 4:26 PM

## 2013-12-03 NOTE — Tx Team (Signed)
Interdisciplinary Treatment Plan Update   Date Reviewed:  12/03/2013  Time Reviewed:  8:28 AM  Progress in Treatment:   Attending groups: Yes Participating in groups: Yes Taking medication as prescribed: Yes  Tolerating medication: Yes Family/Significant other contact made:  No, but will ask patient for consent for collateral contact Patient understands diagnosis: Yes, patient understands diagnosis Discussing patient identified problems/goals with staff: Yes Medical problems stabilized or resolved: Yes Denies suicidal/homicidal ideation: Yes Patient has not harmed self or others: Yes  For review of initial/current patient goals, please see plan of care.  Estimated Length of Stay:  1-2 days  Reasons for Continued Hospitalization:  Anxiety Depression Medication stabilization   New Problems/Goals identified:    Discharge Plan or Barriers:   Home with outpatient follow up scheduled with [facility]  Additional Comments:  Attendees:  Patient:  12/03/2013 8:28 AM   Signature:  Geoffery LyonsIrving Lugo MD 12/03/2013 8:28 AM  Signature: Serena ColonelAggie Nwoko, NP 12/03/2013 8:28 AM  Signature:   12/03/2013 8:28 AM  Signature:Beverly Terrilee CroakKnight, RN 12/03/2013 8:28 AM  Signature:  [place] Minersonna Shimp, RN 12/03/2013 8:28 AM  Signature:  Juline PatchQuylle Dalal Livengood, LCSW 12/03/2013 8:28 AM  Signature:  Baxter HireKristen Drinkard, LCSW-A 12/03/2013 8:28 AM  Signature:  Leisa LenzValerie Enoch, Care Coordinator [facility] 12/03/2013 8:28 AM  Signature:   12/03/2013 8:28 AM  Signature:  12/03/2013  8:28 AM  Signature:   Loura HaltBarbara Goodman, RN [facility] 12/03/2013  8:28 AM  Signature: 12/03/2013  8:28 AM    Scribe for Treatment Team:   Juline PatchQuylle Dorean Daniello,  12/03/2013 8:28 AM

## 2013-12-03 NOTE — Progress Notes (Signed)
The focus of this group is to educate the patient on the purpose and policies of crisis stabilization and provide a format to answer questions about their admission.  The group details unit policies and expectations of patients while admitted.  Patient attended 0900 nurse education orientation group this morning.  Patient actively participated, appropriate affect, alert, appropriate insight and engagement.  Today patient will work on 3 goals for discharge.  

## 2013-12-03 NOTE — Progress Notes (Signed)
[facility]  12/03/2013 5:35 PM Tiffany [name]  MRN:  [id number] Subjective:  States that this distorted thinking pattern has been going on for years. States she has gotten in a routine where she gets up in the morning, gets her coffee, gets the dog out and watches TV. Has exercise devices at home. She admits she does not interact with people. She has let her sister take care of her 47 Y/O daughter. She states that both her sister and her mother put a lot of pressure on her. After her mother died she feels less pressure. She does say she threw few things around the house before she came. She can see how she had too many stimulating medications in place. She accepts she has Bipolar Disorder and can see how the use of stimulants can trow her chemical balance off. States that she thinks she would benefit from an extra Zyprexa in the early afternoon Diagnosis:   DSM5:  Substance/Addictive Disorders:  Stimulant abuse Depressive Disorders:  Major Depressive Disorder - Moderate (296.22) Total Time spent with patient: 30 minutes  Axis I: Substance Induced Mood Disorder, Bipolar Disorder, Delusional disorder  ADL's:  Intact  Sleep: Fair  Appetite:  Fair  Psychiatric Specialty Exam: Physical Exam  Review of Systems  Constitutional: Negative.   HENT: Negative.   Eyes: Negative.   Respiratory: Negative.   Cardiovascular: Negative.   Gastrointestinal: Negative.   Genitourinary: Negative.   Musculoskeletal: Negative.   Skin: Negative.   Neurological: Negative.   Endo/Heme/Allergies: Negative.   Psychiatric/Behavioral: The patient is nervous/anxious.     Blood pressure 125/89, pulse 84, temperature 97.8 F (36.6 C), temperature source Oral, resp. rate 16, height 5\' 4"  (1.626 m), weight 61.236 kg (135 lb), SpO2 98.00%.Body mass index is 23.16 kg/(m^2).  General Appearance: Fairly Groomed  Patent attorneyye Contact::  Fair  Speech:  Clear and Coherent  Volume:  Normal  Mood:  Anxious  Affect:   anxious, worried  Thought Process:  Coherent and Goal Directed  Orientation:  Full (Time, Place, and Person)  Thought Content:  events symptoms worries concerns  Suicidal Thoughts:  No  Homicidal Thoughts:  No  Memory:  Immediate;   Fair Recent;   Fair Remote;   Fair  Judgement:  Fair  Insight:  Present  Psychomotor Activity:  Restlessness  Concentration:  Fair  Recall:  FiservFair  Fund of Knowledge:NA  Language: Fair  Akathisia:  No  Handed:    AIMS (if indicated):     Assets:  Desire for Improvement Housing  Sleep:  Number of Hours: 6.75   Musculoskeletal: Strength & Muscle Tone: within normal limits Gait & Station: normal Patient leans: N/A  Current Medications: Current Facility-Administered Medications  Medication Dose Route Frequency Provider Last Rate Last Dose  . acetaminophen (TYLENOL) tablet 650 mg  650 mg Oral Q4H PRN Nanine MeansJamison Lord, NP      . alum & mag hydroxide-simeth (MAALOX/MYLANTA) 200-200-20 MG/5ML suspension 30 mL  30 mL Oral PRN Nanine MeansJamison Lord, NP      . benzocaine (ORAJEL) 10 % mucosal gel   Mouth/Throat QID PRN Rachael FeeIrving A Jazlin Tapscott, MD      . carbamazepine (TEGRETOL) tablet 200 mg  200 mg Oral BID Beau FannyJohn C Withrow, FNP   200 mg at 12/03/13 0844  . citalopram (CELEXA) tablet 40 mg  40 mg Oral Daily Beau FannyJohn C Withrow, FNP   40 mg at 12/03/13 0845  . haloperidol (HALDOL) tablet 5 mg  5 mg Oral Q8H PRN  Beau Fanny, FNP   5 mg at 12/01/13 1610   Or  . haloperidol lactate (HALDOL) injection 5 mg  5 mg Intramuscular Q8H PRN Beau Fanny, FNP      . hydrOXYzine (ATARAX/VISTARIL) tablet 25 mg  25 mg Oral Q6H PRN Beau Fanny, FNP   25 mg at 12/03/13 1055  . LORazepam (ATIVAN) tablet 1 mg  1 mg Oral TID PRN Beau Fanny, FNP   1 mg at 12/03/13 1345  . magnesium hydroxide (MILK OF MAGNESIA) suspension 30 mL  30 mL Oral Daily PRN Beau Fanny, FNP      . naproxen (NAPROSYN) tablet 500 mg  500 mg Oral BID WC Nanine Means, NP   500 mg at 12/03/13 1612  . nicotine (NICODERM CQ  - dosed in mg/24 hours) patch 21 mg  21 mg Transdermal Daily Nanine Means, NP   21 mg at 12/03/13 0846  . OLANZapine zydis (ZYPREXA) disintegrating tablet 5 mg  5 mg Oral BID Beau Fanny, FNP   5 mg at 12/03/13 0847  . OLANZapine zydis (ZYPREXA) disintegrating tablet 5 mg  5 mg Oral QHS Rachael Fee, MD   5 mg at 12/02/13 2208  . ondansetron (ZOFRAN) tablet 4 mg  4 mg Oral Q8H PRN Nanine Means, NP      . pantoprazole (PROTONIX) EC tablet 80 mg  80 mg Oral Daily Nanine Means, NP   80 mg at 12/03/13 0847  . traZODone (DESYREL) tablet 150 mg  150 mg Oral QHS Nanine Means, NP        Lab Results: No results found for this or any previous visit (from the past 48 hour(s)).  Physical Findings: AIMS: Facial and Oral Movements Muscles of Facial Expression: None, normal Lips and Perioral Area: None, normal Jaw: None, normal Tongue: None, normal,Extremity Movements Upper (arms, wrists, hands, fingers): None, normal Lower (legs, knees, ankles, toes): None, normal, Trunk Movements Neck, shoulders, hips: None, normal, Overall Severity Severity of abnormal movements (highest score from questions above): None, normal Incapacitation due to abnormal movements: None, normal Patient's awareness of abnormal movements (rate only patient's report): No Awareness, Dental Status Current problems with teeth and/or dentures?: No Does patient usually wear dentures?: No  CIWA:  CIWA-Ar Total: 2 COWS:  COWS Total Score: 3  Treatment Plan Summary: Daily contact with patient to assess and evaluate symptoms and progress in treatment Medication management  Plan: Supportive approach/coping skills/relapse prevention           Will increase the Zyprexa to 5 mg TID and HS  Medical Decision Making Problem Points:  Established problem, stable/improving (1) and Review of psycho-social stressors (1) Data Points:  Review of medication regiment & side effects (2) Review of new medications or change in dosage (2)  I  certify that inpatient services furnished can reasonably be expected to improve the patient's condition.   Libi Corso A 12/03/2013, 5:35 PM

## 2013-12-03 NOTE — [facility] Suicide Risk Assessment (Signed)
[facility] INPATIENT:  Family/Significant Other Suicide Prevention Education  Suicide Prevention Education:  Education Completed; Fransisco BeauBecky Forsyth, Family Friend, [phone];  has been identified by the patient as the family member/significant other with whom the patient will be residing, and identified as the person(s) who will aid the patient in the event of a mental health crisis (suicidal ideations/suicide attempt).  With written consent from the patient, the family member/significant other has been provided the following suicide prevention education, prior to the and/or following the discharge of the patient.  The suicide prevention education provided includes the following:  Suicide risk factors  Suicide prevention and interventions  National Suicide Hotline telephone number  [facility] assessment telephone number  [facility] Emergency Assistance 911  [facility] and/or Residential Mobile Crisis Unit telephone number  Request made of family/significant other to:  Remove weapons (e.g., guns, rifles, knives), all items previously/currently identified as safety concern.  Friend advised patient does not have access to weapons.     Remove drugs/medications (over-the-counter, prescriptions, illicit drugs), all items previously/currently identified as a safety concern.  The family member/significant other verbalizes understanding of the suicide prevention education information provided.  The family member/significant other agrees to remove the items of safety concern listed above.  Wynn BankerHodnett, Cailynn Bodnar Hairston 12/03/2013, 4:45 PM

## 2013-12-03 NOTE — [facility] Group Notes (Signed)
[facility] LCSW Group Therapy      Feelings About Diagnosis 1:15 - 2:30 PM         12/03/2013    Type of Therapy:  Group Therapy  Participation Level:  Active  Participation Quality:  Appropriate  Affect:  Appropriate  Cognitive:  Alert and Appropriate  Insight:  Developing/Improving and Engaged  Engagement in Therapy:  Developing/Improving and Engaged  Modes of Intervention:  Discussion, Education, Exploration, Problem-Solving, Rapport Building, Support  Summary of Progress/Problems:  Patient actively participated in group. Patient discussed past and present diagnosis and the effects it has had on  life.  Patient talked about family and society being judgmental and the stigma associated with having a mental health diagnosis.  She shared her illness had limited her in what is able to accomplish including work and education.  Wynn BankerHodnett, Elvera Almario Hairston 12/03/2013

## 2013-12-03 NOTE — [facility] Group Notes (Signed)
Adult Psychoeducational Group Note  Date:  12/03/2013 Time:  1:18 AM  Group Topic/Focus:  Wrap-Up Group:   The focus of this group is to help patients review their daily goal of treatment and discuss progress on daily workbooks.  Participation Level:  Did Not Attend  Participation Quality:  NONE  Affect:  NONE  Cognitive:  NONE  Insight: None  Engagement in Group:  NONE  Modes of Intervention:  NONE  Additional Comments:  Pt did not attend group.  Pearletha AlfredRufus, Belen Zwahlen L 12/03/2013, 1:18 AM

## 2013-12-03 NOTE — Progress Notes (Signed)
D:  Patient's self inventory sheet, patient sleeps good, no sleep medication needed last night.  Good appetite, normal energy level, good concentration.  Denied depression.  Rated hopeless 9, anxiety 5.  Deneie withdrawals.  Denied SI.  Physical problems of back/neck pain, worst pain #8, pain medication helpful.  Wants to go home to her dog.  No problems taking meds after discharge. A:  Medications administered per MD orders.  Emotional support and encouragement. R:  Denied SI and HI.  Denied A/V hallucinations.  Safety maintained with 15 minute checks.

## 2013-12-03 NOTE — Progress Notes (Signed)
D   Pt seemed a little disorganized and confused today   She was pleasant on approach and seemed sleepy  She was just flopped back on her bed but when she was checked on she rose up smiling   She reports having a fairly good day  It was reported that pt was med seeking but there were no med seeking behaviors as of present from this pt A   Verbal support given   Medications administered and effectivness monitored  Educate on medications and encourage group attendance    Q 15 min checks R    Pt safe at present

## 2013-12-03 NOTE — Progress Notes (Addendum)
Patient stated she needs to go home because her dog is sick, will not come out from under the couch, dog pulls her hair out.  Friend has been taking care of her pet but for the past 3 days will not come out from under the couch.  Patient stated she needs to be discharged so she can go to [address] to get help with her utilities which are all late and they are double bills.  Does not have the money for vet fee or utilities.  Patient stated she gets agitated/temper goes to 100 degrees.  Temper is very bad, mad because she is in here, never mad at daughter who is an Chief Technology Officerangel.  Feels she need structured environment which she would have at home/work.  Problems that need to be dealt with at home and she cannot [facility] anything to [facility].  Stated she twisted her glasses at home and broke them, so she needs to get another pair of glasses which she cannot afford.

## 2013-12-04 DIAGNOSIS — F312 Bipolar disorder, current episode manic severe with psychotic features: Secondary | ICD-10-CM

## 2013-12-04 DIAGNOSIS — F319 Bipolar disorder, unspecified: Secondary | ICD-10-CM | POA: Diagnosis present

## 2013-12-04 MED ORDER — TRAZODONE HCL 150 MG PO TABS: 150.0000 mg | ORAL_TABLET | Freq: Every day | ORAL | Status: DC

## 2013-12-04 MED ORDER — ALUM & MAG HYDROXIDE-SIMETH 200-200-20 MG/5ML PO SUSP: 30.0000 mL | ORAL | Status: DC | PRN

## 2013-12-04 MED ORDER — ACETAMINOPHEN 325 MG PO TABS: 650.0000 mg | ORAL_TABLET | Freq: Four times a day (QID) | ORAL | Status: DC | PRN

## 2013-12-04 MED ORDER — OMEPRAZOLE 40 MG PO CPDR: 40.0000 mg | DELAYED_RELEASE_CAPSULE | Freq: Every day | ORAL | Status: DC

## 2013-12-04 MED ORDER — CITALOPRAM HYDROBROMIDE 40 MG PO TABS: 40.0000 mg | ORAL_TABLET | Freq: Every day | ORAL | Status: DC

## 2013-12-04 MED ORDER — MAGNESIUM HYDROXIDE 400 MG/5ML PO SUSP: 30.0000 mL | Freq: Every day | ORAL | Status: DC | PRN

## 2013-12-04 MED ORDER — HYDROXYZINE HCL 25 MG PO TABS: 25.0000 mg | ORAL_TABLET | Freq: Four times a day (QID) | ORAL | Status: DC | PRN

## 2013-12-04 MED ORDER — OLANZAPINE 5 MG PO TBDP
5.0000 mg | ORAL_TABLET | Freq: Three times a day (TID) | ORAL | Status: DC
  Administered 2013-12-04: 5 mg via ORAL
  Filled 2013-12-04 (×8): qty 56

## 2013-12-04 MED ORDER — CARBAMAZEPINE 200 MG PO TABS: 200.0000 mg | ORAL_TABLET | Freq: Two times a day (BID) | ORAL | Status: DC

## 2013-12-04 MED ORDER — OLANZAPINE 5 MG PO TBDP: 5.0000 mg | ORAL_TABLET | Freq: Three times a day (TID) | ORAL | Status: DC

## 2013-12-04 NOTE — [facility] Suicide Risk Assessment (Signed)
Suicide Risk Assessment  Discharge Assessment     Demographic Factors:  Caucasian  Total Time spent with patient: 30 minutes  Psychiatric Specialty Exam:     Blood pressure 125/93, pulse 90, temperature 98.2 F (36.8 C), temperature source Oral, resp. rate 16, height 5\' 4"  (1.626 m), weight 61.236 kg (135 lb), SpO2 98.00%.Body mass index is 23.16 kg/(m^2).  General Appearance: Fairly Groomed  Patent attorneyye Contact::  Fair  Speech:  Clear and Coherent  Volume:  Normal  Mood:  Euthymic  Affect:  Appropriate  Thought Process:  Coherent and Goal Directed  Orientation:  Full (Time, Place, and Person)  Thought Content:  plans as she moves on  Suicidal Thoughts:  No  Homicidal Thoughts:  No  Memory:  Immediate;   Fair Recent;   Fair Remote;   Fair  Judgement:  Fair  Insight:  Present  Psychomotor Activity:  Normal  Concentration:  Fair  Recall:  FiservFair  Fund of Knowledge:NA  Language: Fair  Akathisia:  No  Handed:    AIMS (if indicated):     Assets:  Desire for Improvement Housing  Sleep:  Number of Hours: 5.75    Musculoskeletal: Strength & Muscle Tone: within normal limits Gait & Station: normal Patient leans: N/A   Mental Status Per Nursing Assessment::   On Admission:     Current Mental Status by Physician: In full contact with reality. There are no active S/S of withdrawal. Her mood is euthymic, her affect is appropriate. She states she feels she is ready to go home. She is going to continue to follow up on outpatient basis with Dr. Betti Cruzeddy. States she is mindful of not using the stimulant medications as she was before. Has tolerated this combination of medications well, no side effects. She is also going to continue to stay away from regular use of alcohol   Loss Factors: NA  Historical Factors: NA  Risk Reduction Factors:   Positive social support  Continued Clinical Symptoms:  Bipolar Disorder:   Mixed State  Cognitive Features That Contribute To Risk:   Closed-mindedness Polarized thinking Thought constriction (tunnel vision)    Suicide Risk:  Minimal: No identifiable suicidal ideation.  Patients presenting with no risk factors but with morbid ruminations; may be classified as minimal risk based on the severity of the depressive symptoms  Discharge Diagnoses:   AXIS I:  Bipolar Disorder manic with psychotic features, Substance Induced Mood Disorder AXIS II:  No diagnosis AXIS III:   Past Medical History  Diagnosis Date  . Bipolar disorder   . Anxiety   . Depression   . Auditory hallucination   . Hallucination, visual   . Psychiatric hospitalization   . Tubal ectopic pregnancy   . GERD (gastroesophageal reflux disease)   . Hepatitis C carrier   . Herpes simplex virus infection     history of  . H/O: eczema   . Anxiety   . Alcohol abuse   . Tobacco abuse   . Schizophrenia    AXIS IV:  other psychosocial or environmental problems AXIS V:  61-70 mild symptoms  Plan Of Care/Follow-up recommendations:  Activity:  as tolerated Diet:  regular Follow up Dr. Betti Cruzeddy Is patient on multiple antipsychotic therapies at discharge:  No   Has Patient had three or more failed trials of antipsychotic monotherapy by history:  No  Recommended Plan for Multiple Antipsychotic Therapies: NA    Dianca Owensby A 12/04/2013, 2:00 PM

## 2013-12-04 NOTE — Progress Notes (Signed)
Discharge Note: Discharge instructions/prescriptions/medication samples given to patient. Patient verbalized understanding of discharge instructions and prescriptions. Returned belongings to patient. Denies SI/HI and AVH. Patient d/c without incident to the lobby and transported home by friend.

## 2013-12-04 NOTE — [facility] Group Notes (Signed)
[facility] LCS[facility] Discharge Planning Group Note   12/04/2013 9:51 AM    Participation Quality:  Did not attend group.   Usama Harkless, Joesph JulyQuylle Hairston

## 2013-12-09 NOTE — Progress Notes (Signed)
Patient Discharge Instructions:  After Visit Summary (AVS):   Faxed to:  12/09/13 Psychiatric Admission Assessment Note:   Faxed to:  12/09/13 Suicide Risk Assessment - Discharge Assessment:   Faxed to:  12/09/13 Faxed/Sent to the Next Level Care provider:  12/09/13 Faxed to [facility] @ [phone]  Jerelene Redden, 12/09/2013, 3:20 PM
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
## 2014-03-06 ENCOUNTER — Ambulatory Visit (HOSPITAL_COMMUNITY): Payer: Self-pay | Admitting: Psychiatry

## 2014-03-07 ENCOUNTER — Ambulatory Visit (HOSPITAL_COMMUNITY): Payer: Self-pay | Admitting: Psychiatry

## 2014-03-28 ENCOUNTER — Encounter (HOSPITAL_COMMUNITY): Payer: Self-pay | Admitting: Emergency Medicine

## 2014-03-28 ENCOUNTER — Emergency Department (HOSPITAL_COMMUNITY)
Admission: EM | Admit: 2014-03-28 | Discharge: 2014-03-31 | Disposition: A | Discharge status: Home / Self Care | Payer: Medicare Other | Attending: Emergency Medicine | Admitting: Emergency Medicine

## 2014-03-28 DIAGNOSIS — F3162 Bipolar disorder, current episode mixed, moderate: Secondary | ICD-10-CM

## 2014-03-28 DIAGNOSIS — F209 Schizophrenia, unspecified: Secondary | ICD-10-CM | POA: Insufficient documentation

## 2014-03-28 DIAGNOSIS — K219 Gastro-esophageal reflux disease without esophagitis: Secondary | ICD-10-CM | POA: Diagnosis not present

## 2014-03-28 DIAGNOSIS — F22 Delusional disorders: Secondary | ICD-10-CM | POA: Diagnosis present

## 2014-03-28 DIAGNOSIS — F29 Unspecified psychosis not due to a substance or known physiological condition: Secondary | ICD-10-CM | POA: Insufficient documentation

## 2014-03-28 DIAGNOSIS — Z720 Tobacco use: Secondary | ICD-10-CM | POA: Insufficient documentation

## 2014-03-28 DIAGNOSIS — Z872 Personal history of diseases of the skin and subcutaneous tissue: Secondary | ICD-10-CM | POA: Diagnosis not present

## 2014-03-28 DIAGNOSIS — Z79899 Other long term (current) drug therapy: Secondary | ICD-10-CM | POA: Insufficient documentation

## 2014-03-28 DIAGNOSIS — Z8619 Personal history of other infectious and parasitic diseases: Secondary | ICD-10-CM | POA: Diagnosis not present

## 2014-03-28 DIAGNOSIS — F419 Anxiety disorder, unspecified: Secondary | ICD-10-CM | POA: Diagnosis present

## 2014-03-28 DIAGNOSIS — F1010 Alcohol abuse, uncomplicated: Secondary | ICD-10-CM

## 2014-03-28 DIAGNOSIS — F319 Bipolar disorder, unspecified: Secondary | ICD-10-CM | POA: Diagnosis present

## 2014-03-28 LAB — BASIC METABOLIC PANEL
Anion gap: 17 — ABNORMAL HIGH (ref 5–15)
BUN: 4 mg/dL — ABNORMAL LOW (ref 6–23)
CALCIUM: 8.7 mg/dL (ref 8.4–10.5)
CO2: 20 meq/L (ref 19–32)
CREATININE: 0.57 mg/dL (ref 0.50–1.10)
Chloride: 92 mEq/L — ABNORMAL LOW (ref 96–112)
GFR calc Af Amer: >90 mL/min (ref >90)
Glucose, Bld: 130 mg/dL — ABNORMAL HIGH (ref 70–99)
Potassium: 4.2 mEq/L (ref 3.7–5.3)
Sodium: 129 mEq/L — ABNORMAL LOW (ref 137–147)

## 2014-03-28 LAB — RAPID URINE DRUG SCREEN, HOSP PERFORMED
AMPHETAMINES: NOT DETECTED
BENZODIAZEPINES: NOT DETECTED
Barbiturates: NOT DETECTED
Cocaine: NOT DETECTED
Opiates: NOT DETECTED
TETRAHYDROCANNABINOL: NOT DETECTED

## 2014-03-28 LAB — COMPREHENSIVE METABOLIC PANEL
ALBUMIN: 4.9 g/dL (ref 3.5–5.2)
ALT: 18 U/L (ref 0–35)
ANION GAP: 18 — AB (ref 5–15)
AST: 27 U/L (ref 0–37)
Alkaline Phosphatase: 129 U/L — ABNORMAL HIGH (ref 39–117)
BUN: 3 mg/dL — AB (ref 6–23)
CALCIUM: 10.1 mg/dL (ref 8.4–10.5)
CO2: 23 mEq/L (ref 19–32)
Chloride: 88 mEq/L — ABNORMAL LOW (ref 96–112)
Creatinine, Ser: 0.58 mg/dL (ref 0.50–1.10)
GFR calc Af Amer: >90 mL/min (ref >90)
GFR calc non Af Amer: >90 mL/min (ref >90)
Glucose, Bld: 111 mg/dL — ABNORMAL HIGH (ref 70–99)
Potassium: 3.9 mEq/L (ref 3.7–5.3)
Sodium: 129 mEq/L — ABNORMAL LOW (ref 137–147)
Total Bilirubin: 0.2 mg/dL — ABNORMAL LOW (ref 0.3–1.2)
Total Protein: 8.4 g/dL — ABNORMAL HIGH (ref 6.0–8.3)

## 2014-03-28 LAB — CBC WITH DIFFERENTIAL/PLATELET
BASOS ABS: 0 10*3/uL (ref 0.0–0.1)
BASOS PCT: 0 % (ref 0–1)
EOS PCT: 2 % (ref 0–5)
Eosinophils Absolute: 0.1 10*3/uL (ref 0.0–0.7)
HEMATOCRIT: 39.5 % (ref 36.0–46.0)
Hemoglobin: 13.8 g/dL (ref 12.0–15.0)
Lymphocytes Relative: 28 % (ref 12–46)
Lymphs Abs: 1.4 10*3/uL (ref 0.7–4.0)
MCH: 32.2 pg (ref 26.0–34.0)
MCHC: 34.9 g/dL (ref 30.0–36.0)
MCV: 92.3 fL (ref 78.0–100.0)
MONO ABS: 0.5 10*3/uL (ref 0.1–1.0)
MONOS PCT: 10 % (ref 3–12)
Neutro Abs: 3 10*3/uL (ref 1.7–7.7)
Neutrophils Relative %: 60 % (ref 43–77)
Platelets: 245 10*3/uL (ref 150–400)
RBC: 4.28 MIL/uL (ref 3.87–5.11)
RDW: 13.6 % (ref 11.5–15.5)
WBC: 5 10*3/uL (ref 4.0–10.5)

## 2014-03-28 LAB — CARBAMAZEPINE LEVEL, TOTAL: CARBAMAZEPINE LVL: 7.5 ug/mL (ref 4.0–12.0)

## 2014-03-28 LAB — ETHANOL: Alcohol, Ethyl (B): <11 mg/dL (ref 0–11)

## 2014-03-28 LAB — ACETAMINOPHEN LEVEL

## 2014-03-28 LAB — SALICYLATE LEVEL: Salicylate Lvl: <2 mg/dL — ABNORMAL LOW (ref 2.8–20.0)

## 2014-03-28 MED ORDER — HALOPERIDOL LACTATE 5 MG/ML IJ SOLN
5.0000 mg | Freq: Once | INTRAMUSCULAR | Status: AC
  Administered 2014-03-28: 5 mg via INTRAMUSCULAR
  Filled 2014-03-28: qty 1

## 2014-03-28 MED ORDER — DIPHENHYDRAMINE HCL 50 MG/ML IJ SOLN
50.0000 mg | Freq: Once | INTRAMUSCULAR | Status: AC
  Administered 2014-03-28: 50 mg via INTRAMUSCULAR
  Filled 2014-03-28: qty 1

## 2014-03-28 MED ORDER — SODIUM CHLORIDE 0.9 % IV BOLUS (SEPSIS): 1000.0000 mL | Freq: Once | INTRAVENOUS | Status: DC

## 2014-03-28 MED ORDER — SODIUM CHLORIDE 0.9 % IV BOLUS (SEPSIS)
2000.0000 mL | Freq: Once | INTRAVENOUS | Status: AC
  Administered 2014-03-28: 2000 mL via INTRAVENOUS

## 2014-03-28 MED ORDER — LORAZEPAM 2 MG/ML IJ SOLN
2.0000 mg | Freq: Once | INTRAMUSCULAR | Status: AC
  Administered 2014-03-28: 2 mg via INTRAMUSCULAR
  Filled 2014-03-28: qty 1
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
## 2014-03-28 NOTE — ED Notes (Signed)
Patient up and down from room to dayroom.  Appetite good.  Denies thoughts or plans of suicide.  Patient pacing and restless.

## 2014-03-28 NOTE — ED Notes (Signed)
Noted that bolus was not flowing. Pt had clamped saline lock to stop flow. When questioned about this, pt reported that the fluid is making her too sleepy. Instructed pt again not to mess with the IV. Urine collection hat placed in bathroom and pt instructed to collect urine sample next time she has to urinate. Pt resting in bed currently. Will continue to monitor and will perform [id number] rounds to ensure pt safety, IV patency and bolus administration.

## 2014-03-28 NOTE — ED Notes (Signed)
Patient transferred from ED.  States she had a panic attack and that is why she came to the hospital.  States right now she is very sleepy, but otherwise feels okay.  Denies thoughts of self harm.  Also denies auditory or visual hallucinations.

## 2014-03-28 NOTE — ED Notes (Signed)
Patient sodium level came back at 129, no change from previous lab.  EDP, Walker notified of result an states we will continue to proceed with the psychiatric workup.

## 2014-03-28 NOTE — ED Notes (Addendum)
RN AWARE OF PT CURRENT STATUS AND RECENT MEDICATIONS GIVEN. EMERGENCY IVC. URINE PENDING

## 2014-03-28 NOTE — ED Provider Notes (Signed)
Care assumed from Dr. Silverio LayYao.  He gave her 2L NS and rechecked her labs.  Repeat BMP was not signicantly changed.  On review of past labs, her sodium has been mildly low on multiple priors.  I suspect this is a chronic problem for her.  She appears well, is in no distress, ambulating well.  I think she is stable for psych eval and outpatient follow up for her electrolyte abnormalities.    Tiffany [name] Tiffany Tagliaferro, MD 03/29/14 [phone]

## 2014-03-28 NOTE — ED Notes (Signed)
Bed: WA26 Expected date:  Expected time:  Means of arrival:  Comments: 

## 2014-03-28 NOTE — ED Notes (Signed)
Bed: [facility] Expected date:  Expected time:  Means of arrival:  Comments: EMS anxiety 

## 2014-03-28 NOTE — ED Notes (Signed)
[facility] Expected date: 03/28/14 Expected time:  Means of arrival:  Comments: Hold for TCU Rm 26

## 2014-03-28 NOTE — ED Notes (Signed)
Dr. Wofford at bedside 
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
## 2014-03-28 NOTE — ED Notes (Signed)
Pt noted to be messing with PIV multiple times and would stop as soon as she realized staff was looking at her. Twice, pt was standing in run letting bag of fluid run into floor as she had disconnected the fluid line from the hub of the saline lock. Pt instructed multiple times to not mess with the IV. Pt sts she feels like the fluid is making her sleepy and she doesn't like it. Pt informed that NS will not make her sleepy and that she is probably feeling the effects of the meds she was given early. Pt then starts refusing the second bag of fluid and wanting blood redrawn in between boluses. Pt educated on the purpose of the fluid and that it would be unreasonable to redraw labs. Pt instructed to stay in the bed for safety purposes if she was feeling sleepy and not to mess with IV any further. Pt verbalized understanding but still continues to get out of bed every couple of minutes and drink out of the sink.

## 2014-03-28 NOTE — ED Notes (Addendum)
Pt's IV wrapped in [place] due to pt continuously messing with the IV. Pt beginning to escalate, pulling away from RN, refusing fluids, cursing at [employer]. Pt restless, anxious. Pt appears to be responding to some internal stimuli, stating things like "Give you f...king what?!." Then pt sts directly to this RN that "you are playing with fire girl... You said you weren't giving me sodium." Pt reminded that NS is actively infusing that she will receive it if she stops messing with the IV. RN now sitting at pt's bedside in order to prevent pt from stopping bolus.

## 2014-03-29 DIAGNOSIS — R45851 Suicidal ideations: Secondary | ICD-10-CM

## 2014-03-29 DIAGNOSIS — F3160 Bipolar disorder, current episode mixed, unspecified: Secondary | ICD-10-CM

## 2014-03-29 DIAGNOSIS — Z634 Disappearance and death of family member: Secondary | ICD-10-CM

## 2014-03-29 DIAGNOSIS — F3162 Bipolar disorder, current episode mixed, moderate: Secondary | ICD-10-CM | POA: Diagnosis not present

## 2014-03-29 LAB — COMPREHENSIVE METABOLIC PANEL
ALT: 19 U/L (ref 0–35)
AST: 27 U/L (ref 0–37)
Albumin: 4.1 g/dL (ref 3.5–5.2)
Alkaline Phosphatase: 110 U/L (ref 39–117)
Anion gap: 15 (ref 5–15)
BUN: 5 mg/dL — ABNORMAL LOW (ref 6–23)
CALCIUM: 9.6 mg/dL (ref 8.4–10.5)
CO2: 23 mEq/L (ref 19–32)
CREATININE: 0.59 mg/dL (ref 0.50–1.10)
Chloride: 94 mEq/L — ABNORMAL LOW (ref 96–112)
GLUCOSE: 123 mg/dL — AB (ref 70–99)
Potassium: 4.2 mEq/L (ref 3.7–5.3)
Sodium: 132 mEq/L — ABNORMAL LOW (ref 137–147)
TOTAL PROTEIN: 7.5 g/dL (ref 6.0–8.3)
Total Bilirubin: 0.2 mg/dL — ABNORMAL LOW (ref 0.3–1.2)

## 2014-03-29 MED ORDER — NICOTINE 21 MG/24HR TD PT24
21.0000 mg | MEDICATED_PATCH | Freq: Once | TRANSDERMAL | Status: AC
  Administered 2014-03-29: 21 mg via TRANSDERMAL
  Filled 2014-03-29: qty 1

## 2014-03-29 MED ORDER — ACETAMINOPHEN 325 MG PO TABS
650.0000 mg | ORAL_TABLET | ORAL | Status: DC | PRN
  Administered 2014-03-30: 650 mg via ORAL
  Filled 2014-03-29: qty 2

## 2014-03-29 MED ORDER — LORAZEPAM 1 MG PO TABS
1.0000 mg | ORAL_TABLET | Freq: Three times a day (TID) | ORAL | Status: DC | PRN
  Administered 2014-03-29 – 2014-03-30 (×4): 1 mg via ORAL
  Filled 2014-03-29 (×4): qty 1

## 2014-03-29 MED ORDER — ZOLPIDEM TARTRATE 5 MG PO TABS
5.0000 mg | ORAL_TABLET | Freq: Once | ORAL | Status: AC
  Administered 2014-03-29: 5 mg via ORAL
  Filled 2014-03-29: qty 1

## 2014-03-29 MED ORDER — HYDROXYZINE HCL 25 MG PO TABS
25.0000 mg | ORAL_TABLET | Freq: Four times a day (QID) | ORAL | Status: DC | PRN
  Administered 2014-03-29 – 2014-03-30 (×2): 25 mg via ORAL
  Filled 2014-03-29 (×2): qty 1

## 2014-03-29 MED ORDER — IBUPROFEN 200 MG PO TABS
600.0000 mg | ORAL_TABLET | Freq: Three times a day (TID) | ORAL | Status: DC | PRN
  Administered 2014-03-30 – 2014-03-31 (×2): 600 mg via ORAL
  Filled 2014-03-29 (×2): qty 3

## 2014-03-29 MED ORDER — RISPERIDONE 0.5 MG PO TABS
0.5000 mg | ORAL_TABLET | Freq: Two times a day (BID) | ORAL | Status: DC
  Administered 2014-03-29 – 2014-03-31 (×5): 0.5 mg via ORAL
  Filled 2014-03-29 (×5): qty 1

## 2014-03-29 NOTE — ED Notes (Signed)
Patient presents guarded, restless and suspicious; continues to make trips back and forth from the bathroom to her room;denies SI/HI/AVH. NAD

## 2014-03-29 NOTE — BH Assessment (Signed)
following facilities were contacted in an attempt to place the pt:  Referral Faxed for Review: B[facility]- [facility] Precious HawsKaisha Moore per [facility] per [facility] per Blenda MountsBetsy [facility] per Luretha Ruedammy Rowan per Dorothea OgleBarbara [facility] per [facility] per Demetria PoreStephanie [facility] per [facility] per Britt BologneseJennifer OV-at capacity per Pattricia BossAnnie, but referral faxed for possible wait list   At Capacity: [place] per Cherene Julianrystal Davis per [name] per Princella PellegriniJill [facility] per Freda JacksonJackie Gaston per Steward DroneBrenda  No answer/left message: [facility] at [phone] [facility] at [phone] [facility] at [phone]   TTS will continue to seek placement for the pt.

## 2014-03-29 NOTE — BH Assessment (Signed)
Joann Glover, AC at [facility], confirms adult unit is currently at capacity. Contacted the following facilities for placement:  BED AVAILABLE, PT IS UNDER REVIEW: [facility], per Tyra [facility], per Jerry [facility], Sharita  AT CAPACITY: [facility], per Anita [facility], per Jackie [facility], per Kim [facility], per Lisa [facility], per [facility], per Y[facility], per [facility], per Amy [facility], per Kimberly [facility], per [name], per Lisa [facility], per [name] [facility], per Bernadine [name], per Shanna [name], per Corey [facility], per Susan [facility], per Britney [facility], per Ray  NO RESPONSE: [facility]   Icesis Renn Ellis Perris Tripathi Jr, LPC, NCC Triage Specialist [phone]  
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
## 2014-03-30 DIAGNOSIS — F3162 Bipolar disorder, current episode mixed, moderate: Secondary | ICD-10-CM | POA: Diagnosis not present

## 2014-03-30 MED ORDER — NICOTINE 21 MG/24HR TD PT24
21.0000 mg | MEDICATED_PATCH | Freq: Every day | TRANSDERMAL | Status: DC
  Administered 2014-03-30 – 2014-03-31 (×2): 21 mg via TRANSDERMAL
  Filled 2014-03-30 (×2): qty 1

## 2014-03-30 MED ORDER — HYDROXYZINE HCL 25 MG PO TABS
50.0000 mg | ORAL_TABLET | Freq: Four times a day (QID) | ORAL | Status: DC | PRN
Start: 2014-03-30 — End: 2014-03-31
  Administered 2014-03-30 – 2014-03-31 (×3): 50 mg via ORAL
  Filled 2014-03-30 (×3): qty 2
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
## 2014-03-30 NOTE — ED Notes (Signed)
Patient appears anxious, fidgety and restless. Cooperative. May be somatic. Reports sudden onset neck/shoulder pain. Denies SI, HI, AVH. Reports anxiety 7/10 and feelings of depression r/t being in the hospital 5/10. States she has had trouble getting to sleep d/t racing thoughts and restlessness.   Encouragement offered.  Q 15 safety checks continue.

## 2014-03-31 DIAGNOSIS — F3162 Bipolar disorder, current episode mixed, moderate: Secondary | ICD-10-CM | POA: Insufficient documentation

## 2014-03-31 MED ORDER — RISPERIDONE 0.5 MG PO TABS: 0.5000 mg | ORAL_TABLET | Freq: Two times a day (BID) | ORAL | Status: DC

## 2014-03-31 MED ORDER — HYDROXYZINE HCL 50 MG PO TABS: 50.0000 mg | ORAL_TABLET | Freq: Four times a day (QID) | ORAL | Status: DC | PRN

## 2014-03-31 NOTE — BH Assessment (Signed)
Tiffany [name], [facility], confirmed adult unit is currently at capacity. Contacted the following facilities for placement:  BED AVAILABLE, FAXED CLINICAL INFORMATION: [name], per DIRECTVJennifer  BED AVAILABLE, PT UNDER REVIEW: [name], per Tiffany [name]  AT CAPACITY: [name], per Tiffany [name], per [name], per Tiffany [name], per Tiffany [name], per [name], per Tiffany [name], per Tiffany [name], per Tiffany [name], per Tiffany [name], per [name], per [name], per [name] [place], per [facility] [name], per Devonne DoughtyShanna Alvia GroveBrynn Marr, per [name], per [name], per [name], per [name], per Renae FicklePaul  NO RESPONSE: [name]   [address] Ellis [name], WisconsinLPC, [facility] Triage Specialist [phone]

## 2014-03-31 NOTE — BH Assessment (Signed)
[facility] Assessment Progress Note  Per Thedore MinsMojeed Akintayo, MD, pt is to follow up with her outpatient provider, Dr Gilmore LarocheAkhtar, at [facility] [place].  Her next appointment is scheduled for Friday, 04/25/2014 at 11:00.  This information has been included in pt's discharge instructions.  Doylene Canninghomas Juliano Mceachin, MA Triage Specialist 03/31/2014 @ 13:54

## 2014-03-31 NOTE — [facility] Suicide Risk Assessment (Signed)
Suicide Risk Assessment  Discharge Assessment     Demographic Factors:  Caucasian  Total Time spent with patient: 30 minutes  Psychiatric Specialty Exam:     Blood pressure 130/75, pulse 112, temperature 98 F (36.7 C), temperature source Oral, resp. rate 20, SpO2 100 %.There is no weight on file to calculate BMI.  General Appearance: Casual  Eye Contact::  Good  Speech:  Normal Rate  Volume:  Normal  Mood:  Euthymic  Affect:  Congruent  Thought Process:  Coherent  Orientation:  Full (Time, Place, and Person)  Thought Content:  WDL  Suicidal Thoughts:  No  Homicidal Thoughts:  No  Memory:  Immediate;   Good Recent;   Good Remote;   Good  Judgement:  Fair  Insight:  Fair  Psychomotor Activity:  Normal  Concentration:  Good  Recall:  Good  Fund of Knowledge:Good  Language: Good  Akathisia:  No  Handed:  Right  AIMS (if indicated):     Assets:  Financial Resources/Insurance Housing Intimacy Leisure Time Physical Health Resilience Social Support Transportation  Sleep:       Musculoskeletal: Strength & Muscle Tone: within normal limits Gait & Station: normal Patient leans: N/A   Mental Status Per Nursing Assessment::   On Admission:   Delusional  Current Mental Status by Physician: NA  Loss Factors: NA  Historical Factors: NA  Risk Reduction Factors:   Sense of responsibility to family, Positive social support, Positive therapeutic relationship and Positive coping skills or problem solving skills  Continued Clinical Symptoms:  None  Cognitive Features That Contribute To Risk:  None  Suicide Risk:  Minimal: No identifiable suicidal ideation.  Patients presenting with no risk factors but with morbid ruminations; may be classified as minimal risk based on the severity of the depressive symptoms  Discharge Diagnoses:   AXIS I:  Bipolar, mixed AXIS II:  Deferred AXIS III:   Past Medical History  Diagnosis Date  . Bipolar disorder   .  Anxiety   . Depression   . Auditory hallucination   . Hallucination, visual   . Psychiatric hospitalization   . Tubal ectopic pregnancy   . GERD (gastroesophageal reflux disease)   . Hepatitis C carrier   . Herpes simplex virus infection     history of  . H/O: eczema   . Anxiety   . Alcohol abuse   . Tobacco abuse   . Schizophrenia    AXIS IV:  housing problems, other psychosocial or environmental problems and problems related to social environment AXIS V:  61-70 mild symptoms  Plan Of Care/Follow-up recommendations:  Activity:  as tolerated Diet:  heart healthy diet  Is patient on multiple antipsychotic therapies at discharge:  No   Has Patient had three or more failed trials of antipsychotic monotherapy by history:  No  Recommended Plan for Multiple Antipsychotic Therapies: NA    Diontre Harps, PMH-NP 03/31/2014, 4:49 PM

## 2014-03-31 NOTE — Progress Notes (Signed)
Pt psychiatrically stable for dc back home. Pt denies SI/HI/AH/VH. Pt requesting cab due to not having glasses or contacts and having difficulty navigating on the bus. CSW provided pt with cab voucher. RN to call cab when ready. Pt plans to f/u with Dr. Gilmore LarocheAkhtar on Monday 1/8.   Byrd HesselbachKristen Laiyla Slagel, LCSW [phone]  ED CSW 03/31/2014 1307pm

## 2014-03-31 NOTE — Discharge Instructions (Signed)
For your ongoing mental health needs, you are advised to keep your previously scheduled appointment with Thresa RossNadeem Akhtar, MD at the [facility] [place].  Your appointment is scheduled for Friday, 04/25/2014 at 11:00 am:       [facility]      [address]      [place], [place] [address]      [phone]

## 2014-03-31 NOTE — Consult Note (Signed)
[facility] Consult   Reason for Consult:  Follow-up Referring Physician:  EDP  Tiffany [name] is an 48 y.o. female. Total Time spent with patient: 30 minutes  Assessment: AXIS I:  Bipolar, mixed AXIS II:  Deferred AXIS III:   Past Medical History  Diagnosis Date  . Bipolar disorder   . Anxiety   . Depression   . Auditory hallucination   . Hallucination, visual   . Psychiatric hospitalization   . Tubal ectopic pregnancy   . GERD (gastroesophageal reflux disease)   . Hepatitis C carrier   . Herpes simplex virus infection     history of  . H/O: eczema   . Anxiety   . Alcohol abuse   . Tobacco abuse   . Schizophrenia    AXIS IV:  housing problems, other psychosocial or environmental problems and problems related to social environment AXIS V:  61-70 mild symptoms  Plan:  No evidence of imminent risk to self or others at present.    Subjective:   Tiffany [name] is a 48 y.o. female patient does not warrant admission.  HPI:  The patient has stabilized in the ED.  She is not longer delusional, denies suicidal/homicidal ideations, hallucinations, and alcohol/drug abuse except occasional marijuana use.  Sleeping "well" and has a supportive boyfriend.  She has an appointment on 04/25/13 with her regular provider, Dr. De Nurse.  She states she has to be out of her condo by 12/20 but her boyfriend is going to help.    Past Psychiatric History: Past Medical History  Diagnosis Date  . Bipolar disorder   . Anxiety   . Depression   . Auditory hallucination   . Hallucination, visual   . Psychiatric hospitalization   . Tubal ectopic pregnancy   . GERD (gastroesophageal reflux disease)   . Hepatitis C carrier   . Herpes simplex virus infection     history of  . H/O: eczema   . Anxiety   . Alcohol abuse   . Tobacco abuse   . Schizophrenia     reports that she has been smoking Cigarettes.  She has a 30 pack-year smoking history. She has never used smokeless  tobacco. She reports that she drinks alcohol. She reports that she uses illicit drugs (Marijuana). Family History  Problem Relation Age of Onset  . Hypertension    . COPD Father    Family History Substance Abuse: No Family Supports: No Living Arrangements: Alone (in deceased mother's house which is in foreclosure) Can pt return to current living arrangement?: Yes (unknown) Abuse/Neglect [facility]) Physical Abuse: Denies Verbal Abuse: Denies Sexual Abuse: Denies Allergies:   Allergies  Allergen Reactions  . Sulfa Antibiotics Nausea And Vomiting  . Hydrocodone Other (See Comments)    Percocet is ok per patient but this upsets stomach.    ACT Assessment Complete:  Yes:    Educational Status    Risk to Self: Risk to self with the past 6 months Suicidal Ideation: No Suicidal Intent: No Is patient at risk for suicide?: No Suicidal Plan?: No Access to Means: No Previous Attempts/Gestures: No Intentional Self Injurious Behavior: None Family Suicide History: Unable to assess Recent stressful life event(s): Financial Problems Persecutory voices/beliefs?: Yes Depression: Yes Depression Symptoms: Insomnia, Tearfulness, Isolating, Feeling angry/irritable Substance abuse history and/or treatment for substance abuse?: Yes Suicide prevention information given to non-admitted patients: Not applicable  Risk to Others: Risk to Others within the past 6 months Homicidal Ideation: No Thoughts of Harm to Others:  No Current Homicidal Intent: No Current Homicidal Plan: No Access to Homicidal Means: No History of harm to others?: No Assessment of Violence: None Noted Does patient have access to weapons?: No Criminal Charges Pending?: No Does patient have a court date: No  Abuse: Abuse/Neglect Assessment (Assessment to be complete while patient is alone) Physical Abuse: Denies Verbal Abuse: Denies Sexual Abuse: Denies Exploitation of patient/patient's resources: Denies Self-Neglect: Denies   Prior Inpatient Therapy: Prior Inpatient Therapy Prior Inpatient Therapy: Yes Prior Therapy Dates:  (1 1/2 yr ago) Prior Therapy Facilty/Provider(s):  [facility]) Reason for Treatment:  (anxiety)  Prior Outpatient Therapy: Prior Outpatient Therapy Prior Outpatient Therapy: No  Additional Information: Additional Information 1:1 In Past 12 Months?: No CIRT Risk: Yes Elopement Risk: Yes Does patient have medical clearance?: No                  Objective: Blood pressure 130/75, pulse 112, temperature 98 F (36.7 C), temperature source Oral, resp. rate 20, SpO2 100 %.There is no weight on file to calculate BMI. Results for orders placed or performed during the hospital encounter of 03/28/14 (from the past 72 hour(s))  Basic metabolic panel     Status: Abnormal   Collection Time: 03/28/14  5:58 PM  Result Value Ref Range   Sodium 129 (L) 137 - 147 mEq/L   Potassium 4.2 3.7 - 5.3 mEq/L   Chloride 92 (L) 96 - 112 mEq/L   CO2 20 19 - 32 mEq/L   Glucose, Bld 130 (H) 70 - 99 mg/dL   BUN 4 (L) 6 - 23 mg/dL   Creatinine, Ser 0.57 0.50 - 1.10 mg/dL   Calcium 8.7 8.4 - 10.5 mg/dL   GFR calc non Af Amer >90 >90 mL/min   GFR calc Af Amer >90 >90 mL/min    Comment: (NOTE) The eGFR has been calculated using the CKD EPI equation. This calculation has not been validated in all clinical situations. eGFR's persistently <90 mL/min signify possible Chronic Kidney Disease.    Anion gap 17 (H) 5 - 15  Drug screen panel, emergency     Status: None   Collection Time: 03/28/14  8:18 PM  Result Value Ref Range   Opiates NONE DETECTED NONE DETECTED   Cocaine NONE DETECTED NONE DETECTED   Benzodiazepines NONE DETECTED NONE DETECTED   Amphetamines NONE DETECTED NONE DETECTED   Tetrahydrocannabinol NONE DETECTED NONE DETECTED   Barbiturates NONE DETECTED NONE DETECTED    Comment:        DRUG SCREEN FOR MEDICAL PURPOSES ONLY.  IF CONFIRMATION IS NEEDED FOR ANY PURPOSE, NOTIFY LAB WITHIN 5  DAYS.        LOWEST DETECTABLE LIMITS FOR URINE DRUG SCREEN Drug Class       Cutoff (ng/mL) Amphetamine      1000 Barbiturate      200 Benzodiazepine   893 Tricyclics       734 Opiates          300 Cocaine          300 THC              50   Comprehensive metabolic panel     Status: Abnormal   Collection Time: 03/29/14  8:53 PM  Result Value Ref Range   Sodium 132 (L) 137 - 147 mEq/L   Potassium 4.2 3.7 - 5.3 mEq/L   Chloride 94 (L) 96 - 112 mEq/L   CO2 23 19 - 32 mEq/L   Glucose, Bld 123 (  H) 70 - 99 mg/dL   BUN 5 (L) 6 - 23 mg/dL   Creatinine, Ser 0.59 0.50 - 1.10 mg/dL   Calcium 9.6 8.4 - 10.5 mg/dL   Total Protein 7.5 6.0 - 8.3 g/dL   Albumin 4.1 3.5 - 5.2 g/dL   AST 27 0 - 37 U/L   ALT 19 0 - 35 U/L   Alkaline Phosphatase 110 39 - 117 U/L   Total Bilirubin 0.2 (L) 0.3 - 1.2 mg/dL   GFR calc non Af Amer >90 >90 mL/min   GFR calc Af Amer >90 >90 mL/min    Comment: (NOTE) The eGFR has been calculated using the CKD EPI equation. This calculation has not been validated in all clinical situations. eGFR's persistently <90 mL/min signify possible Chronic Kidney Disease.    Anion gap 15 5 - 15   Labs are reviewed and are pertinent for no medical issues not addressed.  Current Facility-Administered Medications  Medication Dose Route Frequency Provider Last Rate Last Dose  . acetaminophen (TYLENOL) tablet 650 mg  650 mg Oral Q4H PRN Artis Delay, MD   650 mg at 03/30/14 2204  . hydrOXYzine (ATARAX/VISTARIL) tablet 50 mg  50 mg Oral Q6H PRN Durward Parcel, MD   50 mg at 03/31/14 1011  . ibuprofen (ADVIL,MOTRIN) tablet 600 mg  600 mg Oral Q8H PRN Artis Delay, MD   600 mg at 03/31/14 [phone]  . nicotine (NICODERM CQ - dosed in mg/24 hours) patch 21 mg  21 mg Transdermal Daily Shuvon Rankin, NP   21 mg at 03/31/14 1015  . risperiDONE (RISPERDAL) tablet 0.5 mg  0.5 mg Oral BID Durward Parcel, MD   0.5 mg at 03/31/14 1011   Current Outpatient Prescriptions   Medication Sig Dispense Refill  . hydrOXYzine (ATARAX/VISTARIL) 50 MG tablet Take 1 tablet (50 mg total) by mouth every 6 (six) hours as needed for anxiety. 30 tablet 0  . risperiDONE (RISPERDAL) 0.5 MG tablet Take 1 tablet (0.5 mg total) by mouth 2 (two) times daily. 60 tablet 0  . risperiDONE (RISPERDAL) 2 MG tablet Take 1 tablet (2 mg total) by mouth 2 (two) times daily. 30 tablet 0   Psychiatric Specialty Exam:     Blood pressure 130/75, pulse 112, temperature 98 F (36.7 C), temperature source Oral, resp. rate 20, SpO2 100 %.There is no weight on file to calculate BMI.  General Appearance: Casual  Eye Contact::  Good  Speech:  Normal Rate  Volume:  Normal  Mood:  Euthymic  Affect:  Congruent  Thought Process:  Coherent  Orientation:  Full (Time, Place, and Person)  Thought Content:  WDL  Suicidal Thoughts:  No  Homicidal Thoughts:  No  Memory:  Immediate;   Good Recent;   Good Remote;   Good  Judgement:  Fair  Insight:  Fair  Psychomotor Activity:  Normal  Concentration:  Good  Recall:  Good  Fund of Knowledge:Good  Language: Good  Akathisia:  No  Handed:  Right  AIMS (if indicated):     Assets:  Financial Resources/Insurance Housing Intimacy Leisure Time Physical Health Resilience Social Support Transportation  Sleep:       Musculoskeletal: Strength & Muscle Tone: within normal limits Gait & Station: normal Patient leans: N/A  Treatment Plan Summary: Discharge home with follow-up with her regular provider, Dr. De Nurse, at [place], [place], [place] 03/31/2014 4:55 PM  Patient seen, evaluated and I agree with notes by Nurse Practitioner. Corena Pilgrim, MD

## 2014-04-01 ENCOUNTER — Encounter (HOSPITAL_COMMUNITY): Payer: Self-pay | Admitting: Emergency Medicine

## 2014-04-01 ENCOUNTER — Emergency Department (HOSPITAL_COMMUNITY)
Admission: EM | Admit: 2014-04-01 | Discharge: 2014-04-01 | Disposition: A | Discharge status: Home / Self Care | Payer: Medicare Other | Attending: Emergency Medicine | Admitting: Emergency Medicine

## 2014-04-01 DIAGNOSIS — Z720 Tobacco use: Secondary | ICD-10-CM | POA: Insufficient documentation

## 2014-04-01 DIAGNOSIS — F309 Manic episode, unspecified: Secondary | ICD-10-CM

## 2014-04-01 DIAGNOSIS — F528 Other sexual dysfunction not due to a substance or known physiological condition: Secondary | ICD-10-CM | POA: Diagnosis not present

## 2014-04-01 DIAGNOSIS — R462 Strange and inexplicable behavior: Secondary | ICD-10-CM | POA: Diagnosis present

## 2014-04-01 DIAGNOSIS — F419 Anxiety disorder, unspecified: Secondary | ICD-10-CM | POA: Insufficient documentation

## 2014-04-01 DIAGNOSIS — Z872 Personal history of diseases of the skin and subcutaneous tissue: Secondary | ICD-10-CM | POA: Insufficient documentation

## 2014-04-01 DIAGNOSIS — F302 Manic episode, severe with psychotic symptoms: Secondary | ICD-10-CM | POA: Insufficient documentation

## 2014-04-01 DIAGNOSIS — Z008 Encounter for other general examination: Secondary | ICD-10-CM | POA: Diagnosis present

## 2014-04-01 DIAGNOSIS — F39 Unspecified mood [affective] disorder: Secondary | ICD-10-CM

## 2014-04-01 DIAGNOSIS — Z79899 Other long term (current) drug therapy: Secondary | ICD-10-CM | POA: Insufficient documentation

## 2014-04-01 DIAGNOSIS — Z8719 Personal history of other diseases of the digestive system: Secondary | ICD-10-CM | POA: Diagnosis not present

## 2014-04-01 LAB — COMPREHENSIVE METABOLIC PANEL
ALK PHOS: 126 U/L — AB (ref 39–117)
ALT: 18 U/L (ref 0–35)
AST: 28 U/L (ref 0–37)
Albumin: 4.8 g/dL (ref 3.5–5.2)
Anion gap: 17 — ABNORMAL HIGH (ref 5–15)
BUN: 5 mg/dL — ABNORMAL LOW (ref 6–23)
CHLORIDE: 89 meq/L — AB (ref 96–112)
CO2: 23 meq/L (ref 19–32)
Calcium: 10.3 mg/dL (ref 8.4–10.5)
Creatinine, Ser: 0.63 mg/dL (ref 0.50–1.10)
GLUCOSE: 110 mg/dL — AB (ref 70–99)
POTASSIUM: 4.2 meq/L (ref 3.7–5.3)
Sodium: 129 mEq/L — ABNORMAL LOW (ref 137–147)
Total Bilirubin: 0.2 mg/dL — ABNORMAL LOW (ref 0.3–1.2)
Total Protein: 8.4 g/dL — ABNORMAL HIGH (ref 6.0–8.3)

## 2014-04-01 LAB — RAPID URINE DRUG SCREEN, HOSP PERFORMED
Amphetamines: NOT DETECTED
Barbiturates: NOT DETECTED
Benzodiazepines: NOT DETECTED
Cocaine: NOT DETECTED
OPIATES: NOT DETECTED
TETRAHYDROCANNABINOL: NOT DETECTED

## 2014-04-01 LAB — CBC
HCT: 39.7 % (ref 36.0–46.0)
HEMOGLOBIN: 13.9 g/dL (ref 12.0–15.0)
MCH: 32.6 pg (ref 26.0–34.0)
MCHC: 35 g/dL (ref 30.0–36.0)
MCV: 93 fL (ref 78.0–100.0)
Platelets: 259 10*3/uL (ref 150–400)
RBC: 4.27 MIL/uL (ref 3.87–5.11)
RDW: 13.7 % (ref 11.5–15.5)
WBC: 12.3 10*3/uL — AB (ref 4.0–10.5)

## 2014-04-01 LAB — ACETAMINOPHEN LEVEL

## 2014-04-01 LAB — ETHANOL: Alcohol, Ethyl (B): <11 mg/dL (ref 0–11)

## 2014-04-01 LAB — SALICYLATE LEVEL

## 2014-04-01 MED ORDER — IBUPROFEN 200 MG PO TABS: 600.0000 mg | ORAL_TABLET | Freq: Three times a day (TID) | ORAL | Status: DC | PRN

## 2014-04-01 MED ORDER — ONDANSETRON HCL 4 MG PO TABS: 4.0000 mg | ORAL_TABLET | Freq: Three times a day (TID) | ORAL | Status: DC | PRN

## 2014-04-01 MED ORDER — ACETAMINOPHEN 325 MG PO TABS: 650.0000 mg | ORAL_TABLET | ORAL | Status: DC | PRN

## 2014-04-01 MED ORDER — NICOTINE 21 MG/24HR TD PT24: 21.0000 mg | MEDICATED_PATCH | Freq: Every day | TRANSDERMAL | Status: DC

## 2014-04-01 MED ORDER — LORAZEPAM 1 MG PO TABS
1.0000 mg | ORAL_TABLET | Freq: Three times a day (TID) | ORAL | Status: DC | PRN
  Administered 2014-04-01: 1 mg via ORAL
  Filled 2014-04-01: qty 1

## 2014-04-01 MED ORDER — SODIUM CHLORIDE 0.9 % IV BOLUS (SEPSIS): 1000.0000 mL | Freq: Once | INTRAVENOUS | Status: DC

## 2014-04-01 NOTE — ED Notes (Signed)
Bed: WTR5 Expected date:  Expected time:  Means of arrival:  Comments: 

## 2014-04-01 NOTE — ED Notes (Addendum)
Error in charting.

## 2014-04-01 NOTE — BH Assessment (Signed)
Writer informed TTS Kristein of the consult.  

## 2014-04-01 NOTE — BH Assessment (Addendum)
Assessment Note  Tiffany [name] is an 48 y.o. [facility] who was called by [employer] as pt was found undressing at a local gas station.  Pt seen for tele assessment this day.  When asked why pt was here, pt stated, "I have anxiety."  [facility], and has a follow up appt with Tiffany [name] 04/25/13 [facility].  Pt was prescribed Risperdal and Vistaril [facility], but stated she isn't taking it because she needs Ativan by report.  Pt denies SI, HI or AVH currently.  No delusions noted.  Pt stated, "I am done," and that she didn't want to talk anymore to this clinician.  Pt was cooperative, eating a snack, had fair eye contact, was oriented x 4, had logical thought processes, and pressured speech.  When asked why she was undressing at the convenience store, she stated "I was bored."  Pt was irritable, stating she didn't want to talk anymore.  Pt denied SA, but told ED staff she likes to smoke marijuana and drink.  Consulted with Tiffany Rankin, NP, who recommended discharge as pt not currently meeting inpatient criteria, [facility], and has medications with a follow up appt.  Then, consulted with Tiffany CaptainAbigail Harris, PA-C, who also consulted with NP [name], who was in agreement with pt disposition.  Updated ED and TTS staff of pt disposition.  Axis I: 296.40 Bipolar I disorder, Current or most recent episode manic, Unspecified Axis II: Deferred Axis III:  Past Medical History  Diagnosis Date  . Bipolar disorder   . Anxiety   . Depression   . Auditory hallucination   . Hallucination, visual   . Psychiatric hospitalization   . Tubal ectopic pregnancy   . GERD (gastroesophageal reflux disease)   . Hepatitis C carrier   . Herpes simplex virus infection     history of  . H/O: eczema   . Anxiety   . Alcohol abuse   . Tobacco abuse   . Schizophrenia    Axis IV: other psychosocial or environmental problems and problems  with primary support group Axis V: 41-50 serious symptoms  Past Medical History:  Past Medical History  Diagnosis Date  . Bipolar disorder   . Anxiety   . Depression   . Auditory hallucination   . Hallucination, visual   . Psychiatric hospitalization   . Tubal ectopic pregnancy   . GERD (gastroesophageal reflux disease)   . Hepatitis C carrier   . Herpes simplex virus infection     history of  . H/O: eczema   . Anxiety   . Alcohol abuse   . Tobacco abuse   . Schizophrenia     Past Surgical History  Procedure Laterality Date  . Partial left salpingoectomy  05/07/2004    with resection of ectopic pregnancy  . Wisdom tooth extraction    . Liver biopsy  1998  . Elective abortion  1991    Family History:  Family History  Problem Relation Age of Onset  . Hypertension    . COPD Father     Social History:  reports that she has been smoking Cigarettes.  She has a 30 pack-year smoking history. She has never used smokeless tobacco. She reports that she drinks alcohol. She reports that she uses illicit drugs (Marijuana).  Additional Social History:  Alcohol / Drug Use Pain Medications: denies Over the Counter: denies History of alcohol / drug use?:  (  denies, but told ED staff she drinks and smokes marijuana) Longest period of sobriety (when/how long): denies Negative Consequences of Use:  (denies) Withdrawal Symptoms:  (denies)  CIWA: CIWA-Ar BP: 163/90 mmHg Pulse Rate: 105 COWS:    Allergies:  Allergies  Allergen Reactions  . Sulfa Antibiotics Nausea And Vomiting  . Hydrocodone Other (See Comments)    Percocet is ok per patient but this upsets stomach.    Home Medications:  (Not in a hospital admission)  OB/GYN Status:  No LMP recorded. Patient is postmenopausal.  General Assessment Data Location of Assessment: [facility] ED Is this a Tele or Face-to-Face Assessment?: Face-to-Face Is this an Initial Assessment or a Re-assessment for this encounter?: Initial  Assessment Living Arrangements: Alone (in deceased mother's house that is in foreclosure) Can pt return to current living arrangement?: Yes Admission Status: Voluntary Is patient capable of signing voluntary admission?: Yes Transfer from: Acute Hospital Referral Source: Other     [facility] Crisis Care Plan Living Arrangements: Alone (in deceased mother's house that is in foreclosure) Name of Psychiatrist: Dr Gilmore Laroche - scheduled for intake on 04/25/2014 @ 11:00 am Name of Therapist: none  Education Status Is patient currently in school?: No  Risk to self with the past 6 months Suicidal Ideation: No Suicidal Intent: No Is patient at risk for suicide?: No Suicidal Plan?: No Access to Means: No What has been your use of drugs/alcohol within the last 12 months?: pt denies to this Clinical research associate, admits marijuana use to ED staff Previous Attempts/Gestures: No How many times?: 0 Other Self Harm Risks: na-pt denies Triggers for Past Attempts: None known Intentional Self Injurious Behavior: None Family Suicide History: Unable to assess Recent stressful life event(s): Other (Comment) (Indecent exposure ) Persecutory voices/beliefs?: No Depression: Yes Depression Symptoms: Feeling angry/irritable Substance abuse history and/or treatment for substance abuse?:  (UTA) Suicide prevention information given to non-admitted patients: Not applicable  Risk to Others within the past 6 months Homicidal Ideation: No Thoughts of Harm to Others: No Current Homicidal Intent: No Current Homicidal Plan: No Access to Homicidal Means: No Identified Victim: na- pt denies History of harm to others?: No Assessment of Violence: None Noted Violent Behavior Description: na - none observed during assessment Does patient have access to weapons?: No Criminal Charges Pending?: No Does patient have a court date: No  Psychosis Hallucinations: None noted (demies currently, by recently admitted to auditory  hallucina) Delusions: Unspecified, Persecutory (denies currently, but recently endorsed)  Mental Status Report Appear/Hygiene: Disheveled, In scrubs Eye Contact: Fair Motor Activity: Restlessness Speech: Logical/coherent Level of Consciousness: Alert Mood: Irritable Affect: Irritable Anxiety Level: Moderate Thought Processes: Coherent, Relevant Judgement: Impaired Orientation: Person, Place, Time, Situation Obsessive Compulsive Thoughts/Behaviors: None  Cognitive Functioning Concentration: Unable to Assess Memory: Unable to Assess IQ: Average Insight: Unable to Assess Impulse Control: Poor Appetite:  (UTA) Weight Loss:  (Unknown) Weight Gain:  (Unknown) Sleep: Unable to Assess Vegetative Symptoms: Unable to Assess  ADLScreening [facility] Assessment Services) Patient's cognitive ability adequate to safely complete daily activities?: Yes Patient able to express need for assistance with ADLs?: Yes Independently performs ADLs?: Yes (appropriate for developmental age)  Prior Inpatient Therapy Prior Inpatient Therapy: Yes Prior Therapy Dates: 1.5 yrs ago Prior Therapy Facilty/Provider(s): [facility] Reason for Treatment: anxiety  Prior Outpatient Therapy Prior Outpatient Therapy: No Prior Therapy Dates: na Prior Therapy Facilty/Provider(s): na Reason for Treatment: na  ADL Screening (condition at time of admission) Patient's cognitive ability adequate to safely complete daily activities?: Yes Is the patient deaf  or have difficulty hearing?: No Does the patient have difficulty seeing, even when wearing glasses/contacts?: No Does the patient have difficulty concentrating, remembering, or making decisions?: No Patient able to express need for assistance with ADLs?: Yes Does the patient have difficulty dressing or bathing?: No Independently performs ADLs?: Yes (appropriate for developmental age) Does the patient have difficulty walking or climbing stairs?: No  Home Assistive  Devices/Equipment Home Assistive Devices/Equipment: None    Abuse/Neglect Assessment (Assessment to be complete while patient is alone) Physical Abuse: Denies Verbal Abuse: Denies Sexual Abuse: Denies Exploitation of patient/patient's resources: Denies Self-Neglect: Denies Values / Beliefs Cultural Requests During Hospitalization: None Spiritual Requests During Hospitalization: None Consults Spiritual Care Consult Needed: No Social Work Consult Needed: No Merchant navy officerAdvance Directives (For Healthcare) Does patient have an advance directive?: No Would patient like information on creating an advanced directive?: No - patient declined information    Additional Information 1:1 In Past 12 Months?: No CIRT Risk: No Elopement Risk: No Does patient have medical clearance?: Yes     Disposition:  Disposition Initial Assessment Completed for this Encounter: Yes Disposition of Patient: Referred to, Outpatient treatment Type of outpatient treatment: Adult Patient referred to: Outpatient clinic referral (Has scheduled appt with Tiffany [name])  On Site Evaluation by:   Reviewed with Physician:    Casimer LaniusKristen Rolla Servidio, MS, [facility] Licensed Professional Counselor Therapeutic Triage Specialist [facility] Phone: [phone] Fax: [phone]  04/01/2014 3:34 PM

## 2014-04-01 NOTE — ED Notes (Signed)
Pt alert, arrives via EMS, picked up at local gas station, pt was undressing, [employer] contacted EMS, pt denies SI/HI, pt admits to ETOH

## 2014-04-01 NOTE — ED Notes (Signed)
Pt refused PO Gatorade and demanded discharge

## 2014-04-01 NOTE — Progress Notes (Signed)
CSW consulted with NP and charge nurse. Patient will be given a cab voucher in order to go home. Patient confirmed that she lives at the address listed in the facesheet, which [place] [address] [place], [place]  Patient is medically clear and ready for discharge and does not have a ride home. Also, patient does not have any money.   Trish MageBrittney Claudetta Sallie, LCSWA [phone] ED CSW 04/01/2014 4:09 PM
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
## 2014-04-01 NOTE — [facility] Suicide Risk Assessment (Cosign Needed)
Suicide Risk Assessment  Discharge Assessment     Demographic Factors:  Caucasian  Total Time spent with patient: 30 minutes  Psychiatric Specialty Exam:     Blood pressure 163/90, pulse 105, temperature 98 F (36.7 C), temperature source Oral, resp. rate 16, weight 61.236 kg (135 lb), SpO2 99 %.Body mass index is 23.16 kg/(m^2).  General Appearance: Casual  Eye Contact:: Good  Speech: Clear and Coherent and Normal Rate  Volume: Normal  Mood: Anxious  Affect: Congruent  Thought Process: Circumstantial  Orientation: Full (Time, Place, and Person)  Thought Content: Rumination  Suicidal Thoughts: No  Homicidal Thoughts: No  Memory: Immediate; Good Recent; Good Remote; Good  Judgement: Fair  Insight: Fair  Psychomotor Activity: Normal  Concentration: Fair  Recall: Good  Fund of Knowledge:Good  Language: Good  Akathisia: No  Handed: Right  AIMS (if indicated):    Assets: Communication Skills Desire for Improvement  Sleep:     Musculoskeletal: Strength & Muscle Tone: within normal limits Gait & Station: normal Patient leans: N/A   Mental Status Per Nursing Assessment::   On Admission:     Current Mental Status by Physician: Denies suicidal/homicidal ideation, psychosis, and paranoia  Loss Factors: NA  Historical Factors: NA  Risk Reduction Factors:   Positive social support  Continued Clinical Symptoms:  Previous Psychiatric Diagnoses and Treatments  Cognitive Features That Contribute To Risk:  Closed-mindedness    Suicide Risk:  Minimal: No identifiable suicidal ideation.  Patients presenting with no risk factors but with morbid ruminations; may be classified as minimal risk based on the severity of the depressive symptoms  Discharge Diagnoses:  AXIS I: Mood Disorder NOS AXIS II: Deferred AXIS III:  Past Medical History  Diagnosis Date  . Bipolar disorder   . Anxiety   .  Depression   . Auditory hallucination   . Hallucination, visual   . Psychiatric hospitalization   . Tubal ectopic pregnancy   . GERD (gastroesophageal reflux disease)   . Hepatitis C carrier   . Herpes simplex virus infection     history of  . H/O: eczema   . Anxiety   . Alcohol abuse   . Tobacco abuse   . Schizophrenia    AXIS IV: other psychosocial or environmental problems AXIS V: 61-70 mild symptoms    Plan Of Care/Follow-up recommendations:  Activity:  as tolerated Diet:  as tolerated Other:  keep scheduled appointments  Is patient on multiple antipsychotic therapies at discharge:  No   Has Patient had three or more failed trials of antipsychotic monotherapy by history:  No  Recommended Plan for Multiple Antipsychotic Therapies: NA    Rankin, Shuvon, FNP-BC 04/01/2014, 3:47 PM

## 2014-04-01 NOTE — ED Notes (Addendum)
Pt was informed that we need to start an IV and give fluids.  Pt refused and demanded to go home.  Pt offered PO fluid by [facility] staff.

## 2014-04-01 NOTE — Consult Note (Signed)
Tiffany [name] Face-to-Face Psychiatry Consult   Reason for Consult:  Inappropriate behavior Referring Physician:  EDP  PAYSLEY [name] is an 48 y.o. female. Total Time spent with patient: 45 minutes  Assessment: AXIS I:  Mood Disorder NOS AXIS II:  Deferred AXIS III:   Past Medical History  Diagnosis Date  . Bipolar disorder   . Anxiety   . Depression   . Auditory hallucination   . Hallucination, visual   . Psychiatric hospitalization   . Tubal ectopic pregnancy   . GERD (gastroesophageal reflux disease)   . Hepatitis C carrier   . Herpes simplex virus infection     history of  . H/O: eczema   . Anxiety   . Alcohol abuse   . Tobacco abuse   . Schizophrenia    AXIS IV:  other psychosocial or environmental problems AXIS V:  61-70 mild symptoms  Plan:  No evidence of imminent risk to self or others at present.   Patient does not meet criteria for psychiatric inpatient admission. Supportive therapy provided about ongoing stressors. Discussed crisis plan, support from social network, calling 911, coming to the Emergency Department, and calling Suicide Hotline.  Subjective:   Tiffany [name] is a 48 y.o. female patient presents to [name] with complaints of odd behavior.  Patient in parking lot of convenient store pulling up her shirt.  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  Patient also states that she does not need to be in the [name].  Patient states that she has an appointment with Dr. Fredda Hammed April 22, 2013.  Patient states that she was prescribed Vistaril when discharged yesterday but has taken Benadryl "it works the same."  Patient denies suicidal/homicidal ideation, psychosis, and paranoia.    Consulted with Dr. Lucianne Muss and states that patient can be discharge to continue with current plan to follow up with Dr. Fredda Hammed.  \  HPI Elements:   Location:  odd behavior. Quality:  pulling up shirt  showing breast. Severity:  pulling up shirt to show breast. Timing:  1 day. Review of Systems  Psychiatric/Behavioral: Depression: Denies. Suicidal ideas: Denies. Hallucinations: Denies. Memory loss: Denies. Substance abuse: Denies. Nervous/anxious: Denies. Insomnia: Denies.   All other systems reviewed and are negative.  Family History  Problem Relation Age of Onset  . Hypertension    . COPD Father      Past Psychiatric History: Past Medical History  Diagnosis Date  . Bipolar disorder   . Anxiety   . Depression   . Auditory hallucination   . Hallucination, visual   . Psychiatric hospitalization   . Tubal ectopic pregnancy   . GERD (gastroesophageal reflux disease)   . Hepatitis C carrier   . Herpes simplex virus infection     history of  . H/O: eczema   . Anxiety   . Alcohol abuse   . Tobacco abuse   . Schizophrenia     reports that she has been smoking Cigarettes.  She has a 30 pack-year smoking history. She has never used smokeless tobacco. She reports that she drinks alcohol. She reports that she uses illicit drugs (Marijuana). Family History  Problem Relation Age of Onset  . Hypertension    . COPD Father    Family History Substance Abuse: No Family Supports: No Living Arrangements: Alone (in deceased mother's house that is in foreclosure) Can pt return to  current living arrangement?: Yes Abuse/Neglect [facility] Physical Abuse: Denies Verbal Abuse: Denies Sexual Abuse: Denies Allergies:   Allergies  Allergen Reactions  . Sulfa Antibiotics Nausea And Vomiting  . Hydrocodone Other (See Comments)    Percocet is ok per patient but this upsets stomach.    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, admits marijuana use to ED staff Previous Attempts/Gestures: No How many times?: 0 Other Self Harm Risks: na-pt denies Triggers for Past Attempts: None known Intentional Self Injurious Behavior: None Family Suicide History: Unable to assess Recent stressful life event(s): Other (Comment) (Indecent exposure ) Persecutory voices/beliefs?: No Depression: Yes Depression Symptoms: Feeling angry/irritable Substance abuse history and/or treatment for substance abuse?:  (UTA) Suicide prevention information given to non-admitted patients: Not applicable  Risk to Others: Risk to Others within the past 6 months Homicidal Ideation: No Thoughts of Harm to Others: No Current Homicidal Intent: No Current Homicidal Plan: No Access to Homicidal Means: No Identified Victim: na- pt denies History of harm to others?: No Assessment of Violence: None Noted Violent Behavior Description: na - none observed during assessment Does patient have access to weapons?: No Criminal Charges Pending?: No Does patient have a court date: No  Abuse: Abuse/Neglect Assessment (Assessment to be complete while patient is alone) Physical Abuse: Denies Verbal Abuse: Denies Sexual Abuse: Denies Exploitation of patient/patient's resources: Denies Self-Neglect: Denies  Prior Inpatient Therapy: Prior Inpatient Therapy Prior Inpatient Therapy: Yes Prior Therapy Dates: 1.5 yrs ago [facility] Reason for Treatment: anxiety  Prior Outpatient Therapy: Prior Outpatient Therapy Prior Outpatient Therapy: No Prior Therapy Dates: na Prior Therapy Facilty/Provider(s): na Reason for Treatment: na  Additional Information: Additional Information 1:1 In Past 12 Months?: No CIRT Risk: No Elopement Risk: No Does patient have medical clearance?: Yes                  Objective: Blood pressure 163/90, pulse 105, temperature 98 F (36.7 C), temperature source Oral, resp. rate 16,  weight 61.236 kg (135 lb), SpO2 99 %.Body mass index is 23.16 kg/(m^2). Results for orders placed or performed during the [name] encounter of 04/01/14 (from the past 72 hour(s))  Urine Drug Screen     Status: None   Collection Time: 04/01/14  2:19 PM  Result Value Ref Range   Opiates NONE DETECTED NONE DETECTED   Cocaine NONE DETECTED NONE DETECTED   Benzodiazepines NONE DETECTED NONE DETECTED   Amphetamines NONE DETECTED NONE DETECTED   Tetrahydrocannabinol NONE DETECTED NONE DETECTED   Barbiturates NONE DETECTED NONE DETECTED    Comment:        DRUG SCREEN FOR MEDICAL PURPOSES ONLY.  IF CONFIRMATION IS NEEDED FOR ANY PURPOSE, NOTIFY LAB WITHIN 5 DAYS.        LOWEST DETECTABLE LIMITS FOR URINE DRUG SCREEN Drug Class       Cutoff (ng/mL) Amphetamine      1000 Barbiturate      200 Benzodiazepine   200 Tricyclics       300 Opiates          300 Cocaine          300 THC  50   CBC     Status: Abnormal   Collection Time: 04/01/14  2:38 PM  Result Value Ref Range   WBC 12.3 (H) 4.0 - 10.5 K/uL   RBC 4.27 3.87 - 5.11 MIL/uL   Hemoglobin 13.9 12.0 - 15.0 g/dL   HCT 16.139.7 09.636.0 - 04.546.0 %   MCV 93.0 78.0 - 100.0 fL   MCH 32.6 26.0 - 34.0 pg   MCHC 35.0 30.0 - 36.0 g/dL   RDW 40.913.7 81.111.5 - 91.415.5 %   Platelets 259 150 - 400 K/uL   Labs are reviewed see values above.  Medications reviewed and no changes made  Current Facility-Administered Medications  Medication Dose Route Frequency Provider Last Rate Last Dose  . acetaminophen (TYLENOL) tablet 650 mg  650 mg Oral Q4H PRN Arthor CaptainAbigail Harris, PA-C      . ibuprofen (ADVIL,MOTRIN) tablet 600 mg  600 mg Oral Q8H PRN Arthor CaptainAbigail Harris, PA-C      . LORazepam (ATIVAN) tablet 1 mg  1 mg Oral Q8H PRN Arthor CaptainAbigail Harris, PA-C   1 mg at 04/01/14 1458  . nicotine (NICODERM CQ - dosed in mg/24 hours) patch 21 mg  21 mg Transdermal Daily Abigail Harris, PA-C      . ondansetron (ZOFRAN) tablet 4 mg  4 mg Oral Q8H PRN Arthor CaptainAbigail Harris, PA-C        Current Outpatient Prescriptions  Medication Sig Dispense Refill  . hydrOXYzine (ATARAX/VISTARIL) 50 MG tablet Take 1 tablet (50 mg total) by mouth every 6 (six) hours as needed for anxiety. 30 tablet 0  . risperiDONE (RISPERDAL) 0.5 MG tablet Take 1 tablet (0.5 mg total) by mouth 2 (two) times daily. 60 tablet 0  . risperiDONE (RISPERDAL) 2 MG tablet Take 1 tablet (2 mg total) by mouth 2 (two) times daily. 30 tablet 0    Psychiatric Specialty Exam:     Blood pressure 163/90, pulse 105, temperature 98 F (36.7 C), temperature source Oral, resp. rate 16, weight 61.236 kg (135 lb), SpO2 99 %.Body mass index is 23.16 kg/(m^2).  General Appearance: Casual  Eye Contact::  Good  Speech:  Clear and Coherent and Normal Rate  Volume:  Normal  Mood:  Anxious  Affect:  Congruent  Thought Process:  Circumstantial  Orientation:  Full (Time, Place, and Person)  Thought Content:  Rumination  Suicidal Thoughts:  No  Homicidal Thoughts:  No  Memory:  Immediate;   Good Recent;   Good Remote;   Good  Judgement:  Fair  Insight:  Fair  Psychomotor Activity:  Normal  Concentration:  Fair  Recall:  Good  Fund of Knowledge:Good  Language: Good  Akathisia:  No  Handed:  Right  AIMS (if indicated):     Assets:  Communication Skills Desire for Improvement  Sleep:      Musculoskeletal: Strength & Muscle Tone: within normal limits Gait & Station: normal Patient leans: N/A  Treatment Plan Summary: Discharge home.  Patient to keep scheduled appointment with Dr. Theotis BarrioAkthar  Rankin, Shuvon, FNP-BC 04/01/2014 3:34 PM

## 2014-04-01 NOTE — Discharge Instructions (Signed)
Bipolar Disorder °Bipolar disorder is a mental illness. The term bipolar disorder actually is used to describe a group of disorders that all share varying degrees of emotional highs and lows that can interfere with daily functioning, such as work, school, or relationships. Bipolar disorder also can lead to drug abuse, hospitalization, and suicide. °The emotional highs of bipolar disorder are periods of elation or irritability and high energy. These highs can range from a mild form (hypomania) to a severe form (mania). People experiencing episodes of hypomania may appear energetic, excitable, and highly productive. People experiencing mania may behave impulsively or erratically. They often make poor decisions. They may have difficulty sleeping. The most severe episodes of mania can involve having very distorted beliefs or perceptions about the world and seeing or hearing things that are not real (psychotic delusions and hallucinations).  °The emotional lows of bipolar disorder (depression) also can range from mild to severe. Severe episodes of bipolar depression can involve psychotic delusions and hallucinations. °Sometimes people with bipolar disorder experience a state of mixed mood. Symptoms of hypomania or mania and depression are both present during this mixed-mood episode. °SIGNS AND SYMPTOMS °There are signs and symptoms of the episodes of hypomania and mania as well as the episodes of depression. The signs and symptoms of hypomania and mania are similar but vary in severity. They include: °· Inflated self-esteem or feeling of increased self-confidence. °· Decreased need for sleep. °· Unusual talkativeness (rapid or pressured speech) or the feeling of a need to keep talking. °· Sensation of racing thoughts or constant talking, with quick shifts between topics that may or may not be related (flight of ideas). °· Decreased ability to focus or concentrate. °· Increased purposeful activity, such as work, studies,  or social activity, or nonproductive activity, such as pacing, squirming and fidgeting, or finger and toe tapping. °· Impulsive behavior and use of poor judgment, resulting in high-risk activities, such as having unprotected sex or spending excessive amounts of money. °Signs and symptoms of depression include the following:  °· Feelings of sadness, hopelessness, or helplessness. °· Frequent or uncontrollable episodes of crying. °· Lack of feeling anything or caring about anything. °· Difficulty sleeping or sleeping too much.  °· Inability to enjoy the things you used to enjoy.   °· Desire to be alone all the time.   °· Feelings of guilt or worthlessness.  °· Lack of energy or motivation.   °· Difficulty concentrating, remembering, or making decisions.  °· Change in appetite or weight beyond normal fluctuations. °· Thoughts of death or the desire to harm yourself. °DIAGNOSIS  °Bipolar disorder is diagnosed through an assessment by your caregiver. Your caregiver will ask questions about your emotional episodes. There are two main types of bipolar disorder. People with type I bipolar disorder have manic episodes with or without depressive episodes. People with type II bipolar disorder have hypomanic episodes and major depressive episodes, which are more serious than mild depression. The type of bipolar disorder you have can make an important difference in how your illness is monitored and treated. °Your caregiver may ask questions about your medical history and use of alcohol or drugs, including prescription medication. Certain medical conditions and substances also can cause emotional highs and lows that resemble bipolar disorder (secondary bipolar disorder).  °TREATMENT  °Bipolar disorder is a long-term illness. It is best controlled with continuous treatment rather than treatment only when symptoms occur. The following treatments can be prescribed for bipolar disorders: °· Medication--Medication can be prescribed by  a doctor that   is an expert in treating mental disorders (psychiatrists). Medications called mood stabilizers are usually prescribed to help control the illness. Other medications are sometimes added if symptoms of mania, depression, or psychotic delusions and hallucinations occur despite the use of a mood stabilizer.  Talk therapy--Some forms of talk therapy are helpful in providing support, education, and guidance. A combination of medication and talk therapy is best for managing the disorder over time. A procedure in which electricity is applied to your brain through your scalp (electroconvulsive therapy) is used in cases of severe mania when medication and talk therapy do not work or work too slowly. Document Released: 07/11/2000 Document Revised: 07/30/2012 Document Reviewed: 04/30/2012 [facility] Patient Information 2015 [employer], [place]. This information is not intended to replace advice given to you by your health care provider. Make sure you discuss any questions you have with your health care provider.  Sexuality and Disability Sexuality is a normal, important part of life. A disability may result in changes that affect sexuality. Those changes may include:  Movement problems.  Loss of feeling (sensation).  Pain with sexual activity.  Communication problems.  Impaired bowel or bladder control.  Changes in behavior or thinking.  Changes in sexual functioning. This may include changes with erection, ejaculation, lubrication, or orgasm.  Role changes.  Changes in body image. If you have concerns or questions regarding sexuality, ask one of your health care providers. They are available to help you deal with sexuality and disability. If your health care provider is not able to help you, ask him or her to refer you to someone who can. A disability does not mean that this part of your life must come to an end.  FACTORS THAT CAN AFFECT SEX DRIVE AND SEXUAL FUNCTION Many other factors may  affect sex drive and sexual function. Some factors include:  Medicines.  Physical limitations or pain.  Depression and anxiety.  Changes in hormones.  Self-esteem.  Quality of a relationship (how well people relate to each other).  Personal attitudes and values about sex. STRATEGIES FOR THOSE WITH A TEMPORARY OR PERMANENT MEDICAL CONDITION THAT ALTERS SEXUAL FUNCTIONING Various strategies may be used by people who have a medical condition that alters sexual functioning. Strategies may include:  Trying different positions.  Having a partner play a more active role.  Trying different methods of pleasuring.  Using assistive (mechanical) devices.  Using a personal lubricant. Get treatment for sexual problems such as erectile dysfunction or problems with lubrication or orgasm.Also, remember that communication is important in all relationships. It is very important for partners to discuss their thoughts, feelings, needs, and desires.  Sexual intercourse may be difficult or impossible for some couples. These couples may benefit from exploring other ways of giving and receiving pleasure, such as mutual masturbation, oral sex, and nongenital touching. There are numerous ways to still enjoy intimacy and sexuality.  Document Released: 06/25/2002 Document Revised: 01/23/2013 Document Reviewed: 10/02/2012 [facility] Patient Information 2015 [place], [place]. This information is not intended to replace advice given to you by your health care provider. Make sure you discuss any questions you have with your health care provider.

## 2014-04-02 DIAGNOSIS — Z872 Personal history of diseases of the skin and subcutaneous tissue: Secondary | ICD-10-CM | POA: Insufficient documentation

## 2014-04-02 DIAGNOSIS — F10239 Alcohol dependence with withdrawal, unspecified: Secondary | ICD-10-CM | POA: Diagnosis present

## 2014-04-02 DIAGNOSIS — F1010 Alcohol abuse, uncomplicated: Secondary | ICD-10-CM | POA: Diagnosis not present

## 2014-04-02 DIAGNOSIS — E871 Hypo-osmolality and hyponatremia: Secondary | ICD-10-CM | POA: Insufficient documentation

## 2014-04-02 DIAGNOSIS — F209 Schizophrenia, unspecified: Secondary | ICD-10-CM | POA: Insufficient documentation

## 2014-04-02 DIAGNOSIS — F29 Unspecified psychosis not due to a substance or known physiological condition: Secondary | ICD-10-CM | POA: Insufficient documentation

## 2014-04-02 DIAGNOSIS — F329 Major depressive disorder, single episode, unspecified: Secondary | ICD-10-CM | POA: Diagnosis not present

## 2014-04-02 DIAGNOSIS — F419 Anxiety disorder, unspecified: Secondary | ICD-10-CM | POA: Diagnosis not present

## 2014-04-02 DIAGNOSIS — Z8719 Personal history of other diseases of the digestive system: Secondary | ICD-10-CM | POA: Diagnosis not present

## 2014-04-02 DIAGNOSIS — Z8619 Personal history of other infectious and parasitic diseases: Secondary | ICD-10-CM | POA: Insufficient documentation

## 2014-04-02 DIAGNOSIS — F19120 Other psychoactive substance abuse with intoxication, uncomplicated: Secondary | ICD-10-CM | POA: Insufficient documentation

## 2014-04-02 DIAGNOSIS — Z720 Tobacco use: Secondary | ICD-10-CM | POA: Diagnosis not present

## 2014-04-03 ENCOUNTER — Emergency Department (HOSPITAL_COMMUNITY)
Admission: EM | Admit: 2014-04-03 | Discharge: 2014-04-03 | Disposition: A | Discharge status: Home / Self Care | Payer: Medicare Other | Attending: Emergency Medicine | Admitting: Emergency Medicine

## 2014-04-03 ENCOUNTER — Encounter (HOSPITAL_COMMUNITY): Payer: Self-pay | Admitting: Emergency Medicine

## 2014-04-03 DIAGNOSIS — F1010 Alcohol abuse, uncomplicated: Secondary | ICD-10-CM | POA: Diagnosis not present

## 2014-04-03 DIAGNOSIS — F1310 Sedative, hypnotic or anxiolytic abuse, uncomplicated: Secondary | ICD-10-CM

## 2014-04-03 LAB — BASIC METABOLIC PANEL
Anion gap: 13 (ref 5–15)
BUN: 4 mg/dL — ABNORMAL LOW (ref 6–23)
CALCIUM: 8.2 mg/dL — AB (ref 8.4–10.5)
CO2: 21 mEq/L (ref 19–32)
CREATININE: 0.56 mg/dL (ref 0.50–1.10)
Chloride: 97 mEq/L (ref 96–112)
GFR calc non Af Amer: >90 mL/min (ref >90)
Glucose, Bld: 89 mg/dL (ref 70–99)
Potassium: 3.8 mEq/L (ref 3.7–5.3)
Sodium: 131 mEq/L — ABNORMAL LOW (ref 137–147)

## 2014-04-03 LAB — CBC WITH DIFFERENTIAL/PLATELET
BASOS ABS: 0 10*3/uL (ref 0.0–0.1)
Basophils Relative: 0 % (ref 0–1)
EOS ABS: 0.2 10*3/uL (ref 0.0–0.7)
EOS PCT: 2 % (ref 0–5)
HCT: 37 % (ref 36.0–46.0)
Hemoglobin: 13.1 g/dL (ref 12.0–15.0)
LYMPHS ABS: 1.9 10*3/uL (ref 0.7–4.0)
Lymphocytes Relative: 22 % (ref 12–46)
MCH: 31.6 pg (ref 26.0–34.0)
MCHC: 35.4 g/dL (ref 30.0–36.0)
MCV: 89.4 fL (ref 78.0–100.0)
Monocytes Absolute: 1.1 10*3/uL — ABNORMAL HIGH (ref 0.1–1.0)
Monocytes Relative: 13 % — ABNORMAL HIGH (ref 3–12)
Neutro Abs: 5.3 10*3/uL (ref 1.7–7.7)
Neutrophils Relative %: 63 % (ref 43–77)
Platelets: 244 10*3/uL (ref 150–400)
RBC: 4.14 MIL/uL (ref 3.87–5.11)
RDW: 13.2 % (ref 11.5–15.5)
WBC: 8.6 10*3/uL (ref 4.0–10.5)

## 2014-04-03 LAB — RAPID URINE DRUG SCREEN, HOSP PERFORMED
AMPHETAMINES: NOT DETECTED
BARBITURATES: NOT DETECTED
BENZODIAZEPINES: POSITIVE — AB
COCAINE: NOT DETECTED
OPIATES: NOT DETECTED
TETRAHYDROCANNABINOL: NOT DETECTED

## 2014-04-03 LAB — COMPREHENSIVE METABOLIC PANEL
ALBUMIN: 4.2 g/dL (ref 3.5–5.2)
ALK PHOS: 127 U/L — AB (ref 39–117)
ALT: 18 U/L (ref 0–35)
AST: 26 U/L (ref 0–37)
Anion gap: 17 — ABNORMAL HIGH (ref 5–15)
BUN: 5 mg/dL — ABNORMAL LOW (ref 6–23)
CALCIUM: 9.5 mg/dL (ref 8.4–10.5)
CO2: 21 mEq/L (ref 19–32)
Chloride: 85 mEq/L — ABNORMAL LOW (ref 96–112)
Creatinine, Ser: 0.46 mg/dL — ABNORMAL LOW (ref 0.50–1.10)
GFR calc Af Amer: >90 mL/min (ref >90)
GFR calc non Af Amer: >90 mL/min (ref >90)
Glucose, Bld: 93 mg/dL (ref 70–99)
POTASSIUM: 3.5 meq/L — AB (ref 3.7–5.3)
Sodium: 123 mEq/L — ABNORMAL LOW (ref 137–147)
TOTAL PROTEIN: 7.7 g/dL (ref 6.0–8.3)
Total Bilirubin: 0.4 mg/dL (ref 0.3–1.2)

## 2014-04-03 LAB — ETHANOL: Alcohol, Ethyl (B): 16 mg/dL — ABNORMAL HIGH (ref 0–11)

## 2014-04-03 MED ORDER — LORAZEPAM 1 MG PO TABS: 1.0000 mg | ORAL_TABLET | Freq: Three times a day (TID) | ORAL | Status: DC | PRN

## 2014-04-03 MED ORDER — NICOTINE 21 MG/24HR TD PT24
21.0000 mg | MEDICATED_PATCH | Freq: Once | TRANSDERMAL | Status: DC
  Administered 2014-04-03: 21 mg via TRANSDERMAL
  Filled 2014-04-03: qty 1

## 2014-04-03 MED ORDER — NICOTINE 21 MG/24HR TD PT24: 21.0000 mg | MEDICATED_PATCH | Freq: Every day | TRANSDERMAL | Status: DC

## 2014-04-03 MED ORDER — SODIUM CHLORIDE 0.9 % IV SOLN
1000.0000 mL | Freq: Once | INTRAVENOUS | Status: AC
  Administered 2014-04-03: 1000 mL via INTRAVENOUS

## 2014-04-03 MED ORDER — ACETAMINOPHEN 325 MG PO TABS: 650.0000 mg | ORAL_TABLET | ORAL | Status: DC | PRN

## 2014-04-03 MED ORDER — SODIUM CHLORIDE 0.9 % IV SOLN: 1000.0000 mL | INTRAVENOUS | Status: DC

## 2014-04-03 MED ORDER — ONDANSETRON HCL 4 MG PO TABS: 4.0000 mg | ORAL_TABLET | Freq: Three times a day (TID) | ORAL | Status: DC | PRN

## 2014-04-03 MED ORDER — LORAZEPAM 1 MG PO TABS
1.0000 mg | ORAL_TABLET | Freq: Once | ORAL | Status: AC
  Administered 2014-04-03: 1 mg via ORAL
  Filled 2014-04-03: qty 1

## 2014-04-03 NOTE — BHH Counselor (Signed)
Informed of assessment at 07:15.  Assessment completed.  Wolfgang PhoenixBrandi Sabeen Piechocki, [facility] Triage Specialist
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
## 2014-04-03 NOTE — ED Notes (Signed)
Dr Patria Manecampos at bedside to discuss bh np decision to discharge her with outpatient referrals

## 2014-04-03 NOTE — ED Notes (Signed)
DIET ORDERED 12/17

## 2014-04-03 NOTE — ED Notes (Signed)
TTS IN PROGRESS 

## 2014-04-03 NOTE — ED Provider Notes (Signed)
Pt was seen and evaluated by the behavioral health team and felt to be safe for discharge with outpatient resouces from a mental health and substance abuse standpoint. Please see notes for complete details. Cleared from a medical standpoint  Tiffany CoKevin M Lorain Fettes, MD 04/03/14 [phone]

## 2014-04-03 NOTE — BHH Counselor (Signed)
Counselor consulted with Conrad-NP. Per Renata Capriceonrad Pt does not meet inpatient criteria. Renata CapriceConrad recommends discharge with outpatient resources.  Counselor informed Annette-RN and EDP of disposition.   Wolfgang PhoenixBrandi Breuna Loveall, [facility] Triage Specialist

## 2014-04-03 NOTE — ED Provider Notes (Signed)
CSN: [id number]     Arrival date & time 04/02/14  2356 History   First MD Initiated Contact with Patient 04/03/14 0054     Chief Complaint  Patient presents with  . Withdrawal     (Consider location/radiation/quality/duration/timing/severity/associated sxs/prior Treatment) HPI  Past Medical History  Diagnosis Date  . Bipolar disorder   . Anxiety   . Depression   . Auditory hallucination   . Hallucination, visual   . Psychiatric hospitalization   . Tubal ectopic pregnancy   . GERD (gastroesophageal reflux disease)   . Hepatitis C carrier   . Herpes simplex virus infection     history of  . H/O: eczema   . Anxiety   . Alcohol abuse   . Tobacco abuse   . Schizophrenia    Past Surgical History  Procedure Laterality Date  . Partial left salpingoectomy  05/07/2004    with resection of ectopic pregnancy  . Wisdom tooth extraction    . Liver biopsy  1998  . Elective abortion  1991   Family History  Problem Relation Age of Onset  . Hypertension    . COPD Father    History  Substance Use Topics  . Smoking status: Current Every Day Smoker -- 1.50 packs/day for 20 years    Types: Cigarettes  . Smokeless tobacco: Never Used  . Alcohol Use: 0.0 oz/week     Comment: History of drinking 1 six pack of beer daily and 2 six packs on the weekends   OB History    No data available     Review of Systems    Allergies  Sulfa antibiotics and Hydrocodone  Home Medications   Prior to Admission medications   Medication Sig Start Date End Date Taking? Authorizing Provider  hydrOXYzine (ATARAX/VISTARIL) 50 MG tablet Take 1 tablet (50 mg total) by mouth every 6 (six) hours as needed for anxiety. 03/31/14   Nanine MeansJamison Lord, NP  risperiDONE (RISPERDAL) 0.5 MG tablet Take 1 tablet (0.5 mg total) by mouth 2 (two) times daily. 03/31/14   Nanine MeansJamison Lord, NP   BP 129/96 mmHg  Pulse 100  Temp(Src) 97.5 F (36.4 C)  Resp 16  SpO2 97% Physical Exam  ED Course  Procedures (including  critical care time) Labs Review Labs Reviewed  CBC WITH DIFFERENTIAL - Abnormal; Notable for the following:    Monocytes Relative 13 (*)    Monocytes Absolute 1.1 (*)    All other components within normal limits  COMPREHENSIVE METABOLIC PANEL - Abnormal; Notable for the following:    Sodium 123 (*)    Potassium 3.5 (*)    Chloride 85 (*)    BUN 5 (*)    Creatinine, Ser 0.46 (*)    Alkaline Phosphatase 127 (*)    Anion gap 17 (*)    All other components within normal limits  ETHANOL - Abnormal; Notable for the following:    Alcohol, Ethyl (B) 16 (*)    All other components within normal limits  URINE RAPID DRUG SCREEN (HOSP PERFORMED)   Results for orders placed or performed during the hospital encounter of 04/03/14  CBC with Differential  Result Value Ref Range   WBC 8.6 4.0 - 10.5 K/uL   RBC 4.14 3.87 - 5.11 MIL/uL   Hemoglobin 13.1 12.0 - 15.0 g/dL   HCT 11.937.0 14.736.0 - 82.946.0 %   MCV 89.4 78.0 - 100.0 fL   MCH 31.6 26.0 - 34.0 pg   MCHC 35.4 30.0 - 36.0 g/dL  RDW 13.2 11.5 - 15.5 %   Platelets 244 150 - 400 K/uL   Neutrophils Relative % 63 43 - 77 %   Neutro Abs 5.3 1.7 - 7.7 K/uL   Lymphocytes Relative 22 12 - 46 %   Lymphs Abs 1.9 0.7 - 4.0 K/uL   Monocytes Relative 13 (H) 3 - 12 %   Monocytes Absolute 1.1 (H) 0.1 - 1.0 K/uL   Eosinophils Relative 2 0 - 5 %   Eosinophils Absolute 0.2 0.0 - 0.7 K/uL   Basophils Relative 0 0 - 1 %   Basophils Absolute 0.0 0.0 - 0.1 K/uL  Comprehensive metabolic panel  Result Value Ref Range   Sodium 123 (L) 137 - 147 mEq/L   Potassium 3.5 (L) 3.7 - 5.3 mEq/L   Chloride 85 (L) 96 - 112 mEq/L   CO2 21 19 - 32 mEq/L   Glucose, Bld 93 70 - 99 mg/dL   BUN 5 (L) 6 - 23 mg/dL   Creatinine, Ser 4.090.46 (L) 0.50 - 1.10 mg/dL   Calcium 9.5 8.4 - 81.110.5 mg/dL   Total Protein 7.7 6.0 - 8.3 g/dL   Albumin 4.2 3.5 - 5.2 g/dL   AST 26 0 - 37 U/L   ALT 18 0 - 35 U/L   Alkaline Phosphatase 127 (H) 39 - 117 U/L   Total Bilirubin 0.4 0.3 - 1.2 mg/dL    GFR calc non Af Amer >90 >90 mL/min   GFR calc Af Amer >90 >90 mL/min   Anion gap 17 (H) 5 - 15  Ethanol  Result Value Ref Range   Alcohol, Ethyl (B) 16 (H) 0 - 11 mg/dL  Drug screen panel, emergency  Result Value Ref Range   Opiates NONE DETECTED NONE DETECTED   Cocaine NONE DETECTED NONE DETECTED   Benzodiazepines POSITIVE (A) NONE DETECTED   Amphetamines NONE DETECTED NONE DETECTED   Tetrahydrocannabinol NONE DETECTED NONE DETECTED   Barbiturates NONE DETECTED NONE DETECTED  Basic metabolic panel  Result Value Ref Range   Sodium 131 (L) 137 - 147 mEq/L   Potassium 3.8 3.7 - 5.3 mEq/L   Chloride 97 96 - 112 mEq/L   CO2 21 19 - 32 mEq/L   Glucose, Bld 89 70 - 99 mg/dL   BUN 4 (L) 6 - 23 mg/dL   Creatinine, Ser 9.140.56 0.50 - 1.10 mg/dL   Calcium 8.2 (L) 8.4 - 10.5 mg/dL   GFR calc non Af Amer >90 >90 mL/min   GFR calc Af Amer >90 >90 mL/min   Anion gap 13 5 - 15     Imaging Review No results found.   EKG Interpretation None      MDM   Final diagnoses:  None    1. Psychosis 2. Hyponatremia, improved  She is actively psychotic, believing she is being possessed by her mother, and/or the grim reaper; plan is to medically evaluate and clear for psychiatric intervention.   Patient initially refused lab draw but did eventually consent. Sodium markedly low. IV saline ordered. Will continue to monitor.   Recheck sodium after IV fluids significantly improved. Psych holding orders entered with TTS consult.  Arnoldo HookerShari A Gayleen Sholtz, PA-C 04/03/14 0559  Derwood KaplanAnkit Nanavati, MD 04/03/14 [phone]

## 2014-04-03 NOTE — ED Notes (Signed)
Pt pulled out IV, stating she doesn't need it until she knows for sure her sodium hasn't been corrected. EDP notified.
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
## 2014-04-03 NOTE — ED Notes (Signed)
Pt removed IV.

## 2014-04-03 NOTE — Discharge Instructions (Signed)
°Emergency Department Resource Guide °1) Find a Doctor and Pay Out of Pocket °Although you won't have to find out who is covered by your insurance plan, it is a good idea to ask around and get recommendations. You will then need to call the office and see if the doctor you have chosen will accept you as a new patient and what types of options they offer for patients who are self-pay. Some doctors offer discounts or will set up payment plans for their patients who do not have insurance, but you will need to ask so you aren't surprised when you get to your appointment. ° °2) Contact Your Local Health Department °Not all health departments have doctors that can see patients for sick visits, but many do, so it is worth a call to see if yours does. If you don't know where your local health department is, you can check in your phone book. The CDC also has a tool to help you locate your state's health department, and many state websites also have listings of all of their local health departments. ° °3) Find a Walk-in Clinic °If your illness is not likely to be very severe or complicated, you may want to try a walk in clinic. These are popping up all over the country in pharmacies, drugstores, and shopping centers. They're usually staffed by nurse practitioners or physician assistants that have been trained to treat common illnesses and complaints. They're usually fairly quick and inexpensive. However, if you have serious medical issues or chronic medical problems, these are probably not your best option. ° °No Primary Care Doctor: °- Call [facility] Connect at  832-8000 - they can help you locate a primary care doctor that  accepts your insurance, provides certain services, etc. °- Physician Referral Service- 1-800-533-3463 ° °Chronic Pain Problems: °Organization         Address  Phone   Notes  °[facility]  (336) 297-2271 Patients need to be referred by their primary care doctor.  ° °Medication  Assistance: °Organization         Address  Phone   Notes  °[place] [facility] [address] °[place], [place] [address] (336) 641-8030 --Must be a resident of [place] °-- Must have NO insurance coverage whatsoever (no Medicaid/ Medicare, etc.) °-- The pt. MUST have a primary care doctor that directs their care regularly and follows them in the community °  °[facility]  (866) 331-1348   °[employer]  (888) 892-1162   ° °Agencies that provide inexpensive medical care: °Organization         Address  Phone   Notes  °[facility]  (336) 832-8035   °[facility]    (336) 832-7272   °[facility] [address] °[place], [place] [address] (336) 832-4777   °[facility] [address], °[place] (336) 271-4999   °[facility]    (336) 373-0678   °[facility]    (336) 272-1050   °[facility] ° [address], [place] Phone:  (336) 832-4444, Fax:  (336) 832-4440 Hours of Operation:  9 am - 6 pm, M-F.  Also accepts Medicaid/Medicare and self-pay.  °[facility] ° [address],  Phone: (336) 832-3150, Fax: (336) 832-3151. Hours of Operation:  8:30 am - 5:30 pm, M-F.  Also accepts Medicaid and self-pay.  °[facility] [place] [address], [place] Phone: (336) 878-6027   °[facility] [address], [place], [place] (336)723-1848, Ext. 123 Mondays & Thursdays: 7-9 AM.  First 15 patients are seen on a first come, first serve basis. °  ° °Medicaid-accepting [place] Providers: ° °Organization         Address  Phone   Notes  °[facility] [address], [place] (336) 641-2100 Also accepts self-pay patients.  °[facility] [address], [place] ° (336) 856-9996   °[facility] [address], [place]  (336) 288-8857   °[facility] [address] [place] [address], [place] (336) 299-7000   °Veita Bland [address], [place]  ° (336) 373-1557 Only accepts [place] Access Medicaid patients after they have their name applied to their card.  ° °Self-Pay (no insurance) in [place]: ° °Organization         Address  Phone   Notes  °Sickle Cell Patients, [facility] [address], [place] (336) 832-1970   °[facility] [address], [place] (336) 832-4400   ° [facility] ° [address], [place] (336) 992-4800   °[facility]/Dr. Osei-Bonsu ° [address] [place] [address], [place] or [address], [place] (336) 841-8500 Phone number for both [place] and [place] locations is the same.  °[facility] [address], [place] (336) 299-0000   °[facility] [address] [place] [address], [place] or [address] (336) 852-7530 °(336) 878-2260   °[facility] [address], [place] (336) 350-1642, phone; (336) 294-5005, fax Sees patients 1st and 3rd Saturday of every month.  Must not qualify for public or private insurance (i.e. Medicaid, Medicare, [place] Health Choice, Veterans' Benefits) • Household income should be no more than 200% of the poverty level •The clinic cannot treat you if you are pregnant or think you are pregnant • Sexually transmitted diseases are not treated at the clinic.  ° ° °Dental Care: °Organization         Address  Phone  Notes  °[place] [facility] [address], [place] (336) 641-6152 Accepts children up to age 21 who are enrolled in Medicaid or [place] Health Choice; pregnant women with a Medicaid card; and children who have applied for Medicaid or [place] Health Choice, but were declined, whose parents can pay a reduced fee at time of service.  °[place]  [facility] [place]  [address], [place] (336) 641-7733 Accepts children up to age 21 who are enrolled in Medicaid or [place] Health Choice; pregnant women with a Medicaid card; and children who have applied for Medicaid or [place] Health Choice, but were declined, whose parents can pay a reduced fee at time of service.  °[facility] ° [address], [place] (336) 641-4533 Patients are seen by appointment only. Walk-ins are not accepted. [facility] will see patients 18 years of age and older. °Monday - Tuesday (8am-5pm) °Most Wednesdays (8:30-5pm) °$30 per visit, cash only  °[facility] ° [address], [place] (336) 641-4533 Patients are seen by appointment only. Walk-ins are not accepted. [facility] will see patients 18 years of age and older. °One   Wednesday Evening (Monthly: Volunteer Based).  $30 per visit, cash only  °[facility]  (919) 537-3737 for adults; Children under age 4, call Graduate Pediatric Dentistry at (919) [phone]. Children aged 4-14, please call (919) 537-3737 to request a pediatric application. ° Dental services are provided in all areas of dental care including fillings, crowns and bridges, complete and partial dentures, implants, gum treatment, root canals, and extractions. Preventive care is also provided. Treatment is provided to both adults and children. °Patients are selected via a lottery and there is often a waiting list. °  °[facility] [address], °[place] ° (336) 763-8833 [email or web address] °  °[facility] [address], [place], [place] (336)723-1848, Ext. 123 Second and Fourth Thursday of each month, opens at 6:30 AM; Clinic ends at 9 AM.  Patients are seen on a first-come first-served basis, and a limited number are seen during each clinic.  ° °[facility] ° [address], [place], [place] (336) 723-7904    Eligibility Requirements °You must have lived in [place], [place], or [place] counties for at least the last three months. °  You cannot be eligible for state or federal sponsored healthcare insurance, including Veterans Administration, Medicaid, or Medicare. °  You generally cannot be eligible for healthcare insurance through your employer.  °  How to apply: °Eligibility screenings are held every Tuesday and Wednesday afternoon from 1:00 pm until 4:00 pm. You do not need an appointment for the interview!  °[facility] [address], [place], [place] 336-631-2330   °[place] [facility]  336-342-8273   °[place] [facility]  336-703-3100   °[facility]  336-570-6415   ° °Behavioral Health Resources in the Community: °Intensive Outpatient Programs °Organization         Address  Phone  Notes  °[facility] [address], [place], [place] 336-878-6098   °[facility] [address], [place], [place] 336-832-9800   °[facility] [address], [place], [place] ° 336-882-2125   °[facility] [address],  °[place], [place] 1-800-853-5163 or 336-641-4981   °Substance Abuse Resources °Organization         Address  Phone  Notes  °[facility]  336-882-2125   °[facility]  336-784-9470   °[facility]  336-285-9073   °[facility]  336-845-3988   °Residential & Outpatient Substance Abuse Program  1-800-659-3381   °Psychological Services °Organization         Address  Phone  Notes  °[facility]  336- 832-9600   °[facility]  336- 378-7881   °[facility] [address], [place] 1-800-853-5163 or 336-641-4981   ° °Mobile Crisis Teams °Organization         Address  Phone  Notes  °[facility], Mobile Crisis Care Unit  1-877-626-1772   °Assertive °[facility] ° [address]  [place], [place] 336-834-9664   °Sharon DeEsch [address] °[place] 336-554-5454   ° °Self-Help/Support Groups °Organization         Address  Phone             Notes  °[facility]  - variety of support groups  336- 373-1402 Call for more information  °Narcotics Anonymous (NA), [facility] [address], °[place]   2 meetings at this location  ° °  Residential Treatment Programs °Organization         Address  Phone  Notes  °[facility] [address],    °[place]  1-866-801-8205   °[facility] ° [address], [place], [place] 704-293-8524   °[facility] [address], [place] 336-845-3988 Admissions: 8am-3pm M-F  °[facility] [address]    °[place], [place] 336-841-1104   °[facility] [address], [place], [place] 336-379-7146   °[facility] [address]  °[place], [place] 336-285-9073   °[facility] Intensive Outpatient [address], [place], [place] 336-852-3033   °[facility] [address]  °[place], [place] 1-877-615-2722 or 336-784-9470   °[facility] [address] [place], [place] 336-227-7417 Accepts Medicaid  °[facility] [address]  °[place] 1-800-659-3381 Substance Abuse/Addiction Treatment  ° °[place] Behavioral Health Resources °Organization         Address  Phone  Notes  °[facility]  (888) 581-9988   °Julie Brannon, PhD [address] [place], [place]   (336) 349-5553 or (336) 951-0000   °[facility]   [address] °[place], [place] (336) 349-4454   °[facility] [address], [place], [place] (336) 342-8316 Insurance/Medicaid/sponsorship through [facility]  °[facility] [address]                                    [place], [place] (336) 342-8316 Therapy/tele-psych/case    °[facility] [address]  ° ,  (336) 349-2233    °Dr. Arfeen  (336) 349-4544   °[facility] [place]  [employer] [place] [facility] 1) [address],  °2) [address], [place] °3)  [address], [place] (336) 349-3220 °(336) 342-7768 ° °(336) 342-8140   °[place] [facility] (336) 342-1394 or (336) 342-3537 (After Hours)    ° ° °
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
## 2014-04-03 NOTE — ED Notes (Signed)
Chart was assessed by Amedeo Goryhris James EMT under Mack HookBrenda Libni Fusaro EMT name.

## 2014-04-09 ENCOUNTER — Encounter (HOSPITAL_COMMUNITY): Payer: Self-pay

## 2014-04-09 ENCOUNTER — Emergency Department (HOSPITAL_COMMUNITY)
Admission: EM | Admit: 2014-04-09 | Discharge: 2014-04-10 | Disposition: A | Discharge status: Other Acute Inpatient Hospital | Payer: Medicare Other | Attending: Emergency Medicine | Admitting: Emergency Medicine

## 2014-04-09 ENCOUNTER — Encounter (HOSPITAL_COMMUNITY): Payer: Self-pay | Admitting: Emergency Medicine

## 2014-04-09 ENCOUNTER — Emergency Department (HOSPITAL_COMMUNITY)
Admission: EM | Admit: 2014-04-09 | Discharge: 2014-04-09 | Disposition: A | Discharge status: Home / Self Care | Payer: Medicare Other | Source: Home / Self Care | Attending: Emergency Medicine | Admitting: Emergency Medicine

## 2014-04-09 DIAGNOSIS — Z720 Tobacco use: Secondary | ICD-10-CM | POA: Insufficient documentation

## 2014-04-09 DIAGNOSIS — Z872 Personal history of diseases of the skin and subcutaneous tissue: Secondary | ICD-10-CM | POA: Insufficient documentation

## 2014-04-09 DIAGNOSIS — E871 Hypo-osmolality and hyponatremia: Secondary | ICD-10-CM | POA: Insufficient documentation

## 2014-04-09 DIAGNOSIS — R462 Strange and inexplicable behavior: Secondary | ICD-10-CM | POA: Diagnosis present

## 2014-04-09 DIAGNOSIS — Z8719 Personal history of other diseases of the digestive system: Secondary | ICD-10-CM | POA: Insufficient documentation

## 2014-04-09 DIAGNOSIS — F1410 Cocaine abuse, uncomplicated: Secondary | ICD-10-CM | POA: Diagnosis not present

## 2014-04-09 DIAGNOSIS — F1210 Cannabis abuse, uncomplicated: Secondary | ICD-10-CM | POA: Diagnosis not present

## 2014-04-09 DIAGNOSIS — R454 Irritability and anger: Secondary | ICD-10-CM | POA: Insufficient documentation

## 2014-04-09 DIAGNOSIS — F28 Other psychotic disorder not due to a substance or known physiological condition: Secondary | ICD-10-CM | POA: Insufficient documentation

## 2014-04-09 DIAGNOSIS — F319 Bipolar disorder, unspecified: Secondary | ICD-10-CM | POA: Insufficient documentation

## 2014-04-09 DIAGNOSIS — F209 Schizophrenia, unspecified: Secondary | ICD-10-CM | POA: Diagnosis not present

## 2014-04-09 DIAGNOSIS — Z791 Long term (current) use of non-steroidal anti-inflammatories (NSAID): Secondary | ICD-10-CM

## 2014-04-09 DIAGNOSIS — Z79899 Other long term (current) drug therapy: Secondary | ICD-10-CM | POA: Insufficient documentation

## 2014-04-09 DIAGNOSIS — F419 Anxiety disorder, unspecified: Secondary | ICD-10-CM | POA: Diagnosis not present

## 2014-04-09 DIAGNOSIS — R451 Restlessness and agitation: Secondary | ICD-10-CM

## 2014-04-09 DIAGNOSIS — Z8619 Personal history of other infectious and parasitic diseases: Secondary | ICD-10-CM

## 2014-04-09 DIAGNOSIS — F3162 Bipolar disorder, current episode mixed, moderate: Secondary | ICD-10-CM | POA: Diagnosis present

## 2014-04-09 DIAGNOSIS — F1310 Sedative, hypnotic or anxiolytic abuse, uncomplicated: Secondary | ICD-10-CM | POA: Diagnosis not present

## 2014-04-09 DIAGNOSIS — F29 Unspecified psychosis not due to a substance or known physiological condition: Secondary | ICD-10-CM | POA: Diagnosis present

## 2014-04-09 LAB — CBC
HEMATOCRIT: 35.3 % — AB (ref 36.0–46.0)
Hemoglobin: 12.5 g/dL (ref 12.0–15.0)
MCH: 32.5 pg (ref 26.0–34.0)
MCHC: 35.4 g/dL (ref 30.0–36.0)
MCV: 91.7 fL (ref 78.0–100.0)
Platelets: 353 10*3/uL (ref 150–400)
RBC: 3.85 MIL/uL — ABNORMAL LOW (ref 3.87–5.11)
RDW: 13.2 % (ref 11.5–15.5)
WBC: 5.5 10*3/uL (ref 4.0–10.5)

## 2014-04-09 LAB — COMPREHENSIVE METABOLIC PANEL
ALBUMIN: 4.5 g/dL (ref 3.5–5.2)
ALT: 23 U/L (ref 0–35)
ALT: 23 U/L (ref 0–35)
ANION GAP: 11 (ref 5–15)
AST: 33 U/L (ref 0–37)
AST: 27 U/L (ref 0–37)
Albumin: 4.6 g/dL (ref 3.5–5.2)
Alkaline Phosphatase: 111 U/L (ref 39–117)
Alkaline Phosphatase: 118 U/L — ABNORMAL HIGH (ref 39–117)
Anion gap: 9 (ref 5–15)
BILIRUBIN TOTAL: 0.5 mg/dL (ref 0.3–1.2)
BILIRUBIN TOTAL: 0.4 mg/dL (ref 0.3–1.2)
BUN: 7 mg/dL (ref 6–23)
BUN: 7 mg/dL (ref 6–23)
CALCIUM: 9.4 mg/dL (ref 8.4–10.5)
CHLORIDE: 92 meq/L — AB (ref 96–112)
CHLORIDE: 90 meq/L — AB (ref 96–112)
CO2: 22 mmol/L (ref 19–32)
CO2: 26 mmol/L (ref 19–32)
CREATININE: 0.67 mg/dL (ref 0.50–1.10)
Calcium: 9.3 mg/dL (ref 8.4–10.5)
Creatinine, Ser: 0.7 mg/dL (ref 0.50–1.10)
GFR calc Af Amer: >90 mL/min (ref >90)
GFR calc Af Amer: >90 mL/min (ref >90)
GFR calc non Af Amer: >90 mL/min (ref >90)
Glucose, Bld: 109 mg/dL — ABNORMAL HIGH (ref 70–99)
Glucose, Bld: 105 mg/dL — ABNORMAL HIGH (ref 70–99)
POTASSIUM: 3.7 mmol/L (ref 3.5–5.1)
Potassium: 4 mmol/L (ref 3.5–5.1)
SODIUM: 125 mmol/L — AB (ref 135–145)
Sodium: 125 mmol/L — ABNORMAL LOW (ref 135–145)
Total Protein: 7.8 g/dL (ref 6.0–8.3)
Total Protein: 7.6 g/dL (ref 6.0–8.3)

## 2014-04-09 LAB — CBC WITH DIFFERENTIAL/PLATELET
BASOS ABS: 0 10*3/uL (ref 0.0–0.1)
BASOS PCT: 0 % (ref 0–1)
Eosinophils Absolute: 0.2 10*3/uL (ref 0.0–0.7)
Eosinophils Relative: 3 % (ref 0–5)
HEMATOCRIT: 37.2 % (ref 36.0–46.0)
HEMOGLOBIN: 13.1 g/dL (ref 12.0–15.0)
Lymphocytes Relative: 26 % (ref 12–46)
Lymphs Abs: 2.1 10*3/uL (ref 0.7–4.0)
MCH: 32 pg (ref 26.0–34.0)
MCHC: 35.2 g/dL (ref 30.0–36.0)
MCV: 90.7 fL (ref 78.0–100.0)
MONO ABS: 0.9 10*3/uL (ref 0.1–1.0)
MONOS PCT: 11 % (ref 3–12)
NEUTROS ABS: 4.9 10*3/uL (ref 1.7–7.7)
Neutrophils Relative %: 60 % (ref 43–77)
Platelets: 337 10*3/uL (ref 150–400)
RBC: 4.1 MIL/uL (ref 3.87–5.11)
RDW: 13.2 % (ref 11.5–15.5)
WBC: 8.2 10*3/uL (ref 4.0–10.5)

## 2014-04-09 LAB — RAPID URINE DRUG SCREEN, HOSP PERFORMED
Amphetamines: NOT DETECTED
BARBITURATES: NOT DETECTED
BENZODIAZEPINES: POSITIVE — AB
Cocaine: POSITIVE — AB
OPIATES: NOT DETECTED
Tetrahydrocannabinol: POSITIVE — AB

## 2014-04-09 LAB — ETHANOL
Alcohol, Ethyl (B): <5 mg/dL (ref 0–9)
Alcohol, Ethyl (B): <5 mg/dL (ref 0–9)

## 2014-04-09 LAB — ACETAMINOPHEN LEVEL

## 2014-04-09 LAB — SALICYLATE LEVEL: Salicylate Lvl: <4 mg/dL (ref 2.8–20.0)

## 2014-04-09 MED ORDER — LORAZEPAM 1 MG PO TABS
1.0000 mg | ORAL_TABLET | Freq: Once | ORAL | Status: AC
  Administered 2014-04-09: 1 mg via ORAL
  Filled 2014-04-09: qty 1

## 2014-04-09 MED ORDER — ACETAMINOPHEN 325 MG PO TABS: 650.0000 mg | ORAL_TABLET | ORAL | Status: DC | PRN

## 2014-04-09 MED ORDER — NICOTINE 21 MG/24HR TD PT24: 21.0000 mg | MEDICATED_PATCH | Freq: Every day | TRANSDERMAL | Status: DC

## 2014-04-09 MED ORDER — CARBAMAZEPINE 200 MG PO TABS
200.0000 mg | ORAL_TABLET | Freq: Two times a day (BID) | ORAL | Status: DC
  Filled 2014-04-09 (×2): qty 1

## 2014-04-09 MED ORDER — HYDROXYZINE HCL 25 MG PO TABS
25.0000 mg | ORAL_TABLET | Freq: Four times a day (QID) | ORAL | Status: DC | PRN
  Administered 2014-04-09 – 2014-04-10 (×2): 25 mg via ORAL
  Filled 2014-04-09 (×2): qty 1

## 2014-04-09 MED ORDER — TRAZODONE HCL 50 MG PO TABS: 50.0000 mg | ORAL_TABLET | Freq: Every evening | ORAL | Status: DC | PRN

## 2014-04-09 MED ORDER — ONDANSETRON HCL 4 MG PO TABS
4.0000 mg | ORAL_TABLET | Freq: Three times a day (TID) | ORAL | Status: DC | PRN
Start: 2014-04-09 — End: 2014-04-09

## 2014-04-09 MED ORDER — BUPROPION HCL ER (XL) 300 MG PO TB24
300.0000 mg | ORAL_TABLET | Freq: Every day | ORAL | Status: DC
  Filled 2014-04-09: qty 1

## 2014-04-09 MED ORDER — STERILE WATER FOR INJECTION IJ SOLN
INTRAMUSCULAR | Status: AC
  Filled 2014-04-09: qty 10

## 2014-04-09 MED ORDER — NAPROXEN 500 MG PO TABS
500.0000 mg | ORAL_TABLET | Freq: Two times a day (BID) | ORAL | Status: DC
  Administered 2014-04-09: 500 mg via ORAL
  Filled 2014-04-09: qty 1

## 2014-04-09 MED ORDER — IBUPROFEN 200 MG PO TABS: 600.0000 mg | ORAL_TABLET | Freq: Three times a day (TID) | ORAL | Status: DC | PRN

## 2014-04-09 MED ORDER — ALUM & MAG HYDROXIDE-SIMETH 200-200-20 MG/5ML PO SUSP: 30.0000 mL | ORAL | Status: DC | PRN

## 2014-04-09 MED ORDER — ZIPRASIDONE MESYLATE 20 MG IM SOLR
20.0000 mg | Freq: Once | INTRAMUSCULAR | Status: AC
  Administered 2014-04-09: 20 mg via INTRAMUSCULAR
  Filled 2014-04-09: qty 20

## 2014-04-09 MED ORDER — HYDROXYZINE HCL 25 MG PO TABS
50.0000 mg | ORAL_TABLET | Freq: Four times a day (QID) | ORAL | Status: DC | PRN
  Administered 2014-04-09: 50 mg via ORAL
  Filled 2014-04-09: qty 2

## 2014-04-09 MED ORDER — RISPERIDONE 0.5 MG PO TABS: 0.5000 mg | ORAL_TABLET | Freq: Two times a day (BID) | ORAL | Status: DC

## 2014-04-09 MED ORDER — ZOLPIDEM TARTRATE 5 MG PO TABS: 5.0000 mg | ORAL_TABLET | Freq: Every evening | ORAL | Status: DC | PRN
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
## 2014-04-09 NOTE — Progress Notes (Signed)
Called [facility] and spoke with  Luiz Blare. Perry @ [phone] to inform her of patient needing consult

## 2014-04-09 NOTE — ED Notes (Signed)
It took me 20 minutes to get her dressed in burgundy scrubs

## 2014-04-09 NOTE — BH Assessment (Addendum)
Inpt SA/MH treatment recommended no [facility] bed available. Sent referral to the following for potential placement: [facility] [place] [facility]- declined due to no being IVC'd per [place] they only accept [facility]  Clista BernhardtNancy Makana Rostad, [facility] Triage Specialist 04/09/2014 9:10 PM
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
## 2014-04-09 NOTE — BH Assessment (Signed)
Assessment Note  Tiffany [name] is an 49 y.o. female that arrived to [facility] via [facility] after calling 911 for anxiety. Pt seen and discharged earlier today for the same complaint.. When asked why pt was here pt stated, "I have anxiety and Ativan withdrawal." Patient asked about her last use of Ativan and she states March 23, 2014. Patient reports tremors and hot/cold flashes. Pt denied other SA but Clinical research associate informed patient that UDS was + for Cocaine and Benzo's. Patient stated, "It's not way I don't do drugs or drink".  Pt  has a follow up appt with Dr. Alta Corning 04/25/13 at [facility] OP clinic. Pt denies SI, HI or AVH currently. Patient however mentioned later during our assessment she can hear the thoughts of others. Patient reports a dx's of schizophrenia and Bipolar Disorder. Pt sts, "Please admit me so I can get back on my menthal health medications".    Pt was cooperative, eating a snack, had fair eye contact, was oriented x 4, had logical thought processes, and pressured speech.   Consulted with Shuvon Rankin, NP, whom sts that patient meets criteria for a inpatient admission. Patient appropriate for [facility]. No appropriate [facility] bed available at this time. Per Assunta Found, NP if no appropriate bed available patient will be re-evaluated by psychiatry in the am.     Axis I: Bipolar Disorder NOS, Schizophrenia, Anxiety Disorder NOS, Polysubstance Abuse Axis II: Deferred Axis III:  Past Medical History  Diagnosis Date  . Bipolar disorder   . Anxiety   . Depression   . Auditory hallucination   . Hallucination, visual   . Psychiatric hospitalization   . Tubal ectopic pregnancy   . GERD (gastroesophageal reflux disease)   . Hepatitis C carrier   . Herpes simplex virus infection     history of  . H/O: eczema   . Anxiety   . Alcohol abuse   . Tobacco abuse   . Schizophrenia    Axis IV: other psychosocial or environmental problems, problems related to social environment, problems with  access to health care services and problems with primary support group Axis V: 31-40 impairment in reality testing  Past Medical History:  Past Medical History  Diagnosis Date  . Bipolar disorder   . Anxiety   . Depression   . Auditory hallucination   . Hallucination, visual   . Psychiatric hospitalization   . Tubal ectopic pregnancy   . GERD (gastroesophageal reflux disease)   . Hepatitis C carrier   . Herpes simplex virus infection     history of  . H/O: eczema   . Anxiety   . Alcohol abuse   . Tobacco abuse   . Schizophrenia     Past Surgical History  Procedure Laterality Date  . Partial left salpingoectomy  05/07/2004    with resection of ectopic pregnancy  . Wisdom tooth extraction    . Liver biopsy  1998  . Elective abortion  1991    Family History:  Family History  Problem Relation Age of Onset  . Hypertension    . COPD Father     Social History:  reports that she has been smoking Cigarettes.  She has a 30 pack-year smoking history. She has never used smokeless tobacco. She reports that she drinks alcohol. She reports that she uses illicit drugs (Marijuana).  Additional Social History:  Alcohol / Drug Use Pain Medications: SEE MAR Prescriptions: SEE MAR Over the Counter: SEE MAR History of alcohol / drug use?: Yes  Substance #1 Name of Substance 1: Ativan  1 - Age of First Use: 48 yrs old  1 - Amount (size/oz): 4x's per day  1 - Frequency: daily  1 - Duration: on-going  1 - Last Use / Amount: 03/23/2014 Substance #2 Name of Substance 2: Patient's UDS positive for THC, however; denies use. 2 - Age of First Use: unk 2 - Amount (size/oz): unk 2 - Frequency: unk 2 - Duration: unk 2 - Last Use / Amount: unk Substance #3 Name of Substance 3: Patient's UDS postive for Cocaine, however; denies use 3 - Age of First Use: unk 3 - Amount (size/oz): unk 3 - Frequency: unk 3 - Duration: unk 3 - Last Use / Amount: unk  CIWA: CIWA-Ar BP: 138/89 mmHg Pulse  Rate: 90 COWS:    Allergies:  Allergies  Allergen Reactions  . Sulfa Antibiotics Nausea And Vomiting  . Hydrocodone Other (See Comments)     this upsets stomach.    Home Medications:  (Not in a hospital admission)  OB/GYN Status:  No LMP recorded. Patient is postmenopausal.  General Assessment Data [facility] of [facility]: [facility] Is this a Tele or Face-to-Face Assessment?: Face-to-Face Is this an Initial Assessment or a Re-assessment for this encounter?: Initial Assessment Living Arrangements: Alone Can pt return to current living arrangement?: Yes Admission Status: Voluntary Is patient capable of signing voluntary admission?: Yes Transfer from: Acute Hospital Referral Source: Self/Family/Friend     [facility] Living Arrangements: Alone Name of Psychiatrist: Dr Gilmore LarocheAkhtar - scheduled for intake on 04/25/2014 @ 11:00 am Name of Therapist: none     Risk to self with the past 6 months Suicidal Ideation: No Suicidal Intent: No Is patient at risk for suicide?: No Suicidal Plan?: No Access to Means: No What has been your use of drugs/alcohol within the last 12 months?:  (n/a) Previous Attempts/Gestures: No How many times?:  (n/a) Other Self Harm Risks:  (none reported ) Triggers for Past Attempts: None known Intentional Self Injurious Behavior: None Family Suicide History: No Recent stressful life event(s):  (none noted ) Persecutory voices/beliefs?: No Depression: Yes Depression Symptoms: Feeling angry/irritable, Loss of interest in usual pleasures, Tearfulness, Despondent, Fatigue Substance abuse history and/or treatment for substance abuse?: No Suicide prevention information given to non-admitted patients: Not applicable  Risk to Others within the past 6 months Homicidal Ideation: No Thoughts of Harm to Others: No Current Homicidal Intent: No Current Homicidal Plan: No Access to Homicidal Means: No Identified Victim:  (n/a) History of harm to others?:  No Assessment of Violence: None Noted Violent Behavior Description:  (patient calm and cooperative ) Does patient have access to weapons?: No Criminal Charges Pending?: Yes Describe Pending Criminal Charges:  ([place]) Does patient have a court date: Yes Court Date:  (patient unsure of court date)  Psychosis Hallucinations: Auditory Delusions: Unspecified  Mental Status Report Appear/Hygiene: Disheveled, In scrubs Eye Contact: Fair Motor Activity: Restlessness Speech: Logical/coherent Level of Consciousness: Drowsy Mood: Depressed, Anxious Affect: Appropriate to circumstance Anxiety Level: Severe Thought Processes: Relevant Judgement: Impaired Orientation: Person, Place, Time, Situation Obsessive Compulsive Thoughts/Behaviors: Minimal  Cognitive Functioning Concentration: Decreased Memory: Remote Intact, Recent Intact IQ: Average Insight: Fair Impulse Control: Poor Appetite: Poor Weight Loss:  (patient reports wt. loss but unsure of amt) Weight Gain:  (none reported ) Sleep: No Change Total Hours of Sleep:  (varies ) Vegetative Symptoms: None  [facility] Patient's cognitive ability adequate to safely complete daily activities?: Yes Patient able to  express need for assistance with ADLs?: Yes Independently performs ADLs?: No  Prior Inpatient Therapy Prior Inpatient Therapy: Yes Prior Therapy Dates: 1.5 yrs ago [facility] Reason for Treatment: anxiety  Prior Outpatient Therapy Prior Outpatient Therapy: No Prior Therapy Dates: NA Prior Therapy Facilty/Provider(s): NA Reason for Treatment: NA  ADL Screening (condition at time of admission) Patient's cognitive ability adequate to safely complete daily activities?: Yes Is the patient deaf or have difficulty hearing?: No Does the patient have difficulty seeing, even when wearing glasses/contacts?: No Does the patient have difficulty concentrating,  remembering, or making decisions?: Yes Patient able to express need for assistance with ADLs?: Yes Does the patient have difficulty dressing or bathing?: No Independently performs ADLs?: No Communication: Independent Dressing (OT): Independent Grooming: Independent Feeding: Independent Bathing: Independent Toileting: Independent In/Out Bed: Independent Walks in Home: Independent Does the patient have difficulty walking or climbing stairs?: No Weakness of Legs: None Weakness of Arms/Hands: None  Home Assistive Devices/Equipment Home Assistive Devices/Equipment: None    Abuse/Neglect Assessment (Assessment to be complete while patient is alone) Physical Abuse: Denies Verbal Abuse: Denies Sexual Abuse: Denies Exploitation of patient/patient's resources: Denies Self-Neglect: Denies Values / Beliefs Cultural Requests During Hospitalization: None Spiritual Requests During Hospitalization: None   Advance Directives (For Healthcare) Does patient have an advance directive?: No Would patient like information on creating an advanced directive?: No - patient declined information    Additional Information 1:1 In Past 12 Months?: No CIRT Risk: No Elopement Risk: No Does patient have medical clearance?: Yes     Disposition:  Disposition Initial Assessment Completed for this Encounter: Yes Disposition of Patient: Inpatient treatment program (Appropriate for Inpt tx per Shuvon if no beds re-evaluate am) Type of inpatient treatment program: Adult Type of outpatient treatment: Adult  On Site Evaluation by:   Reviewed with Physician:    Melynda Rippleerry, Miraj Truss [facility] 04/09/2014 6:47 PM

## 2014-04-09 NOTE — ED Provider Notes (Signed)
CSN: [id number]     Arrival date & time 04/09/14  1614 History   First MD Initiated Contact with Patient 04/09/14 1631     Chief Complaint  Patient presents with  . psychotic      The history is provided by the patient.   Ms. Karena AddisonHuffine comes in for medication problems. She was brought in by EMS from the hotel where she picked up her belongings today. She states the owner of the hotel called EMS on her. She states that she is just trying to get her medication straight. She states that nobody will fill her medications and she can get in to see her doctor for refills because they won't see her anymore. She states that her Ativan was swollen on December 7 and she's been in withdrawal since that time. She denies any SI or HI. She also states that she has ADHD and she can keep her thoughts straight and her thoughts are often racing together. History is limited because patient is a poor historian. She states that she is a smoker but does not inhale. She is a sometimes drinker, but not recently. She states she uses recreational marijuana but no other drugs.  Past Medical History  Diagnosis Date  . Bipolar disorder   . Anxiety   . Depression   . Auditory hallucination   . Hallucination, visual   . Psychiatric hospitalization   . Tubal ectopic pregnancy   . GERD (gastroesophageal reflux disease)   . Hepatitis C carrier   . Herpes simplex virus infection     history of  . H/O: eczema   . Anxiety   . Alcohol abuse   . Tobacco abuse   . Schizophrenia    Past Surgical History  Procedure Laterality Date  . Partial left salpingoectomy  05/07/2004    with resection of ectopic pregnancy  . Wisdom tooth extraction    . Liver biopsy  1998  . Elective abortion  1991   Family History  Problem Relation Age of Onset  . Hypertension    . COPD Father    History  Substance Use Topics  . Smoking status: Current Every Day Smoker -- 1.50 packs/day for 20 years    Types: Cigarettes  . Smokeless  tobacco: Never Used  . Alcohol Use: 0.0 oz/week     Comment: History of drinking 1 six pack of beer daily and 2 six packs on the weekends   OB History    No data available     Review of Systems  All other systems reviewed and are negative.     Allergies  Sulfa antibiotics and Hydrocodone  Home Medications   Prior to Admission medications   Medication Sig Start Date End Date Taking? Authorizing Provider  buPROPion (WELLBUTRIN XL) 300 MG 24 hr tablet Take 300 mg by mouth daily.    Historical Provider, MD  carbamazepine (TEGRETOL) 200 MG tablet Take 200 mg by mouth 2 (two) times daily.    Historical Provider, MD  hydrOXYzine (ATARAX/VISTARIL) 50 MG tablet Take 1 tablet (50 mg total) by mouth every 6 (six) hours as needed for anxiety. 03/31/14   Nanine MeansJamison Lord, NP  naproxen (NAPROSYN) 500 MG tablet Take 500 mg by mouth 2 (two) times daily with a meal.    Historical Provider, MD  risperiDONE (RISPERDAL) 0.5 MG tablet Take 1 tablet (0.5 mg total) by mouth 2 (two) times daily. 03/31/14   Nanine MeansJamison Lord, NP   BP 138/89 mmHg  Pulse 90  Temp(Src)  97.3 F (36.3 C) (Oral)  Resp 16  SpO2 100% Physical Exam  Constitutional: She is oriented to person, place, and time. She appears well-developed and well-nourished.  HENT:  Head: Normocephalic and atraumatic.  Cardiovascular: Normal rate and regular rhythm.   No murmur heard. Pulmonary/Chest: Effort normal and breath sounds normal. No respiratory distress.  Abdominal: Soft. There is no tenderness. There is no rebound and no guarding.  Musculoskeletal: She exhibits no edema or tenderness.  Neurological: She is alert and oriented to person, place, and time.  Skin: Skin is warm and dry.  Psychiatric:  Anxious with pressured speech and flight of ideas. At times patient pauses in her speech and needs to be redirected as if she forgets what the conversation was going. No active SI or HI  Nursing note and vitals reviewed.   ED Course   Procedures (including critical care time) Labs Review Labs Reviewed  ACETAMINOPHEN LEVEL - Abnormal; Notable for the following:    Acetaminophen (Tylenol), Serum <10.0 (*)    All other components within normal limits  CBC - Abnormal; Notable for the following:    RBC 3.85 (*)    HCT 35.3 (*)    All other components within normal limits  COMPREHENSIVE METABOLIC PANEL - Abnormal; Notable for the following:    Sodium 125 (*)    Chloride 90 (*)    Glucose, Bld 105 (*)    Alkaline Phosphatase 118 (*)    All other components within normal limits  URINE RAPID DRUG SCREEN (HOSP PERFORMED) - Abnormal; Notable for the following:    Cocaine POSITIVE (*)    Benzodiazepines POSITIVE (*)    Tetrahydrocannabinol POSITIVE (*)    All other components within normal limits  ETHANOL  SALICYLATE LEVEL    Imaging Review No results found.   EKG Interpretation None      MDM   Final diagnoses:  Other psychotic disorder not due to substance or known physiological condition  Hyponatremia    Patient here with pressured speech and flight of ideas, difficulty redirect on exam. She is actively psychotic in the emergency Department but has no homicidal or suicidle thoughts. She has had recent multiple emergency department visits recently for "medication problems" because doctors, police, and EMS won't get her medications straight. Patient does not know any of her medications other than Ativan. Feel that site consultation is necessary to see what we can do about her psychotic features of mania. Patient is noted to be hyponatremic, reviewed multiple prior laboratory studies as well as prior admission in 2012 and patient has a history of chronic recurrent hyponatremia. Her hyponatremia has resolved in the past with fluid restriction to 1-1/2 L daily and cessation of alcohol use. Patient is euvolemic on exam. Patient placed in psych holding pending psychiatry evaluation.    Tilden FossaElizabeth Zaiyah Sottile, MD 04/09/14  [phone]

## 2014-04-09 NOTE — BH Assessment (Signed)
Writer was informed by nursing staff that patient would be discharged this am. No TTS consult needed.

## 2014-04-09 NOTE — ED Provider Notes (Signed)
CSN: [id number]     Arrival date & time 04/09/14  0009 History   First MD Initiated Contact with Patient 04/09/14 0041     Chief Complaint  Patient presents with  . Hallucinations      HPI Pt was [place] ER from gas station where she was found irritable and agitated. Pt states she thinks she is withdrawing from ativan. No HI or SI. Denies ETOH tonight. Pt denies medication noncompliance. Police were concerned the pt was speaking to herself at the gas station.    Past Medical History  Diagnosis Date  . Bipolar disorder   . Anxiety   . Depression   . Auditory hallucination   . Hallucination, visual   . Psychiatric hospitalization   . Tubal ectopic pregnancy   . GERD (gastroesophageal reflux disease)   . Hepatitis C carrier   . Herpes simplex virus infection     history of  . H/O: eczema   . Anxiety   . Alcohol abuse   . Tobacco abuse   . Schizophrenia    Past Surgical History  Procedure Laterality Date  . Partial left salpingoectomy  05/07/2004    with resection of ectopic pregnancy  . Wisdom tooth extraction    . Liver biopsy  1998  . Elective abortion  1991   Family History  Problem Relation Age of Onset  . Hypertension    . COPD Father    History  Substance Use Topics  . Smoking status: Current Every Day Smoker -- 1.50 packs/day for 20 years    Types: Cigarettes  . Smokeless tobacco: Never Used  . Alcohol Use: 0.0 oz/week     Comment: History of drinking 1 six pack of beer daily and 2 six packs on the weekends   OB History    No data available     Review of Systems  All other systems reviewed and are negative.     Allergies  Sulfa antibiotics and Hydrocodone  Home Medications   Prior to Admission medications   Medication Sig Start Date End Date Taking? Authorizing Provider  buPROPion (WELLBUTRIN XL) 300 MG 24 hr tablet Take 300 mg by mouth daily.   Yes Historical Provider, MD  carbamazepine (TEGRETOL) 200 MG tablet Take 200 mg by mouth 2  (two) times daily.   Yes Historical Provider, MD  naproxen (NAPROSYN) 500 MG tablet Take 500 mg by mouth 2 (two) times daily with a meal.   Yes Historical Provider, MD  hydrOXYzine (ATARAX/VISTARIL) 50 MG tablet Take 1 tablet (50 mg total) by mouth every 6 (six) hours as needed for anxiety. 03/31/14   Nanine MeansJamison Lord, NP  risperiDONE (RISPERDAL) 0.5 MG tablet Take 1 tablet (0.5 mg total) by mouth 2 (two) times daily. 03/31/14   Nanine MeansJamison Lord, NP   BP 111/52 mmHg  Pulse 103  Temp(Src) 97.6 F (36.4 C) (Oral)  Resp 16  SpO2 100% Physical Exam  Constitutional: She is oriented to person, place, and time. She appears well-developed and well-nourished. No distress.  HENT:  Head: Normocephalic and atraumatic.  Eyes: EOM are normal.  Neck: Normal range of motion.  Cardiovascular: Regular rhythm and normal heart sounds.   Pulmonary/Chest: Effort normal.  Abdominal: Soft. She exhibits no distension.  Musculoskeletal: Normal range of motion.  Neurological: She is alert and oriented to person, place, and time.  Skin: Skin is warm and dry.  Psychiatric: Her affect is angry. Her speech is rapid and/or pressured. She is agitated. Thought content is not  paranoid and not delusional. She expresses no homicidal and no suicidal ideation.  Nursing note and vitals reviewed.   ED Course  Procedures (including critical care time) Labs Review Labs Reviewed  COMPREHENSIVE METABOLIC PANEL - Abnormal; Notable for the following:    Sodium 125 (*)    Chloride 92 (*)    Glucose, Bld 109 (*)    All other components within normal limits  CBC WITH DIFFERENTIAL  ETHANOL  URINE RAPID DRUG SCREEN (HOSP PERFORMED)    Imaging Review No results found.   EKG Interpretation None      MDM   Final diagnoses:  Agitation    7:45 AM Pt is calm and cooperative at this time. She states she came to ER concerned about benzo withdrawal. No HI or SI at this time. Will discharge home with outpatient resources.      Lyanne CoKevin M Zlaty Alexa, MD 04/09/14 [phone]

## 2014-04-09 NOTE — Discharge Instructions (Signed)
°Emergency Department Resource Guide °1) Find a Doctor and Pay Out of Pocket °Although you won't have to find out who is covered by your insurance plan, it is a good idea to ask around and get recommendations. You will then need to call the office and see if the doctor you have chosen will accept you as a new patient and what types of options they offer for patients who are self-pay. Some doctors offer discounts or will set up payment plans for their patients who do not have insurance, but you will need to ask so you aren't surprised when you get to your appointment. ° °2) Contact Your Local Health Department °Not all health departments have doctors that can see patients for sick visits, but many do, so it is worth a call to see if yours does. If you don't know where your local health department is, you can check in your phone book. The CDC also has a tool to help you locate your state's health department, and many state websites also have listings of all of their local health departments. ° °3) Find a Walk-in Clinic °If your illness is not likely to be very severe or complicated, you may want to try a walk in clinic. These are popping up all over the country in pharmacies, drugstores, and shopping centers. They're usually staffed by nurse practitioners or physician assistants that have been trained to treat common illnesses and complaints. They're usually fairly quick and inexpensive. However, if you have serious medical issues or chronic medical problems, these are probably not your best option. ° °No Primary Care Doctor: °- Call [facility] at  832-8000 - they can help you locate a primary care doctor that  accepts your insurance, provides certain services, etc. °- Physician Referral Service- 1-800-533-3463 ° °Chronic Pain Problems: °Organization         Address  Phone   Notes  °[facility]  (336) 297-2271 Patients need to be referred by their primary care doctor.  ° °Medication  Assistance: °Organization         Address  Phone   Notes  °[place] [facility] [address] °[place], [place] [address] (336) 641-8030 --Must be a resident of [place] °-- Must have NO insurance coverage whatsoever (no Medicaid/ Medicare, etc.) °-- The pt. MUST have a primary care doctor that directs their care regularly and follows them in the community °  °[facility]  (866) 331-1348   °[employer]  (888) 892-1162   ° °Agencies that provide inexpensive medical care: °Organization         Address  Phone   Notes  °[facility]  (336) 832-8035   °[facility]    (336) 832-7272   °[facility] [address] °[name], [name] [address] (336) 832-4777   °[facility] [address], °[place] (336) 271-4999   °[facility]    (336) 373-0678   °[facility]    (336) 272-1050   °[facility] ° [address], [place] Phone:  (336) 832-4444, Fax:  (336) 832-4440 Hours of Operation:  9 am - 6 pm, M-F.  Also accepts Medicaid/Medicare and self-pay.  °[facility] ° [address],  Phone: (336) 832-3150, Fax: (336) 832-3151. Hours of Operation:  8:30 am - 5:30 pm, M-F.  Also accepts Medicaid and self-pay.  °[facility] [place] [address], [place] Phone: (336) 878-6027   °[facility] [address], [place], [place] (336)723-1848, Ext. 123 Mondays & Thursdays: 7-9 AM.  First 15 patients are seen on a first come, first serve basis. °  ° °Medicaid-accepting [place] Providers: ° °Organization         Address  Phone   Notes  °[facility] [address], [place] (336) 641-2100 Also accepts self-pay patients.  °[facility] [address], [place] ° (336) 856-9996   °[facility] [address], [place]  (336) 288-8857   °[facility] [address] [place] [address], [place] (336) 299-7000   °Veita Bland [address], [place]  ° (336) 373-1557 Only accepts [place] Access Medicaid patients after they have their name applied to their card.  ° °Self-Pay (no insurance) in [place]: ° °Organization         Address  Phone   Notes  °Sickle Cell Patients, [facility] [address], [place] (336) 832-1970   °[facility] [address], [place] (336) 832-4400   °[facility] ° [address], [place] (336) 992-4800   °[facility]/Dr. Osei-Bonsu ° [address] [place] [address], [place] or [address], [place] (336) 841-8500 Phone number for both [place] and [place] locations is the same.  °[facility] [address], [place] (336) 299-0000   °[facility] [address] [place] [address], [place] or [address] (336) 852-7530 °(336) 878-2260   °[facility] [address], [place] (336) 350-1642, phone; (336) 294-5005, fax Sees patients 1st and 3rd Saturday of every month.  Must not qualify for public or private insurance (i.e. Medicaid, Medicare, [place] Health Choice, Veterans' Benefits) • Household income should be no more than 200% of the poverty level •The clinic cannot treat you if you are pregnant or think you are pregnant • Sexually transmitted diseases are not treated at the clinic.  ° ° °Dental Care: °Organization         Address  Phone  Notes  °[place] [facility] [address], [place] (336) 641-6152 Accepts children up to age 21 who are enrolled in Medicaid or [place] Health Choice; pregnant women with a Medicaid card; and children who have applied for Medicaid or [place] Health Choice, but were declined, whose parents can pay a reduced fee at time of service.  °[place]  [facility] [place]  [address], [place] (336) 641-7733 Accepts children up to age 21 who are enrolled in Medicaid or [place] Health Choice; pregnant women with a Medicaid card; and children who have applied for Medicaid or [place] Health Choice, but were declined, whose parents can pay a reduced fee at time of service.  °[facility] ° [address], [place] (336) 641-4533 Patients are seen by appointment only. Walk-ins are not accepted. [facility] will see patients 18 years of age and older. °Monday - Tuesday (8am-5pm) °Most Wednesdays (8:30-5pm) °$30 per visit, cash only  °[facility] ° [address], [place] (336) 641-4533 Patients are seen by appointment only. Walk-ins are not accepted. [facility] will see patients 18 years of age and older. °One   Wednesday Evening (Monthly: Volunteer Based).  $30 per visit, cash only  °[facility]  (919) 537-3737 for adults; Children under age 4, call Graduate Pediatric Dentistry at (919) [phone]. Children aged 4-14, please call (919) [phone] to request a pediatric application. ° Dental services are provided in all areas of dental care including fillings, crowns and bridges, complete and partial dentures, implants, gum treatment, root canals, and extractions. Preventive care is also provided. Treatment is provided to both adults and children. °Patients are selected via a lottery and there is often a waiting list. °  °[facility] [address], °[place] ° (336) 763-8833 [email or web address] °  °[facility] [address], [place], [place] (336)723-1848, Ext. 123 Second and Fourth Thursday of each month, opens at 6:30 AM; Clinic ends at 9 AM.  Patients are seen on a first-come first-served basis, and a limited number are seen during each clinic.  ° °[facility] ° [address], [place], [place] (336) 723-7904    Eligibility Requirements °You must have lived in [place], [place], or [place] counties for at least the last three months. °  You cannot be eligible for state or federal sponsored healthcare insurance, including Veterans Administration, Medicaid, or Medicare. °  You generally cannot be eligible for healthcare insurance through your employer.  °  How to apply: °Eligibility screenings are held every Tuesday and Wednesday afternoon from 1:00 pm until 4:00 pm. You do not need an appointment for the interview!  °[facility] [address], [place], [place] 336-631-2330   °[facility]  336-342-8273   °[facility]  336-703-3100   °[facility]  336-570-6415   ° °Behavioral Health Resources in the Community: °Intensive Outpatient Programs °Organization         Address  Phone  Notes  °[facility] [address], [place], [place] 336-878-6098   °[facility] [address], [place], [place] 336-832-9800   °[facility] [address], [place], [place] ° 336-882-2125   °[facility] [address],  °[place], [place] 1-800-853-5163 or 336-641-4981   °Substance Abuse Resources °Organization         Address  Phone  Notes  °[facility]  336-882-2125   °[facility]  336-784-9470   °[facility] [place] [facility]  336-285-9073   °[facility]  336-845-3988   °Residential & Outpatient Substance Abuse Program  1-800-659-3381   °Psychological Services °Organization         Address  Phone  Notes  °[facility]  336- 832-9600   °[facility]  336- 378-7881   °[facility] [address], [place] 1-800-853-5163 or 336-641-4981   ° °Mobile Crisis Teams °Organization         Address  Phone  Notes  °[facility], Mobile Crisis Care Unit  1-877-626-1772   °Assertive °[facility] ° [address]  [place], [place] 336-834-9664   °Sharon DeEsch [address] °[place] 336-554-5454   ° °Self-Help/Support Groups °Organization         Address  Phone             Notes  °[facility] - variety of support groups  336- 373-1402 Call for more information  °Narcotics Anonymous (NA), [facility] [address], °[place]  2 meetings at this location  ° °  Residential Treatment Programs °Organization         Address  Phone  Notes  °[facility] [address],    °[place]  1-866-801-8205   °[facility] ° [address], [place], [place] 704-293-8524   °[facility] [address], [place] 336-845-3988 Admissions: 8am-3pm M-F  °[facility] [address]    °[place], [place] 336-841-1104   °[facility] [address], [place], [place] 336-379-7146   °[facility] [place] [facility] [address]  °[place], [place] 336-285-9073   °[facility] Intensive Outpatient [address], [place], [place] 336-852-3033   °[facility] [address]  °[place], [place] 1-877-615-2722 or 336-784-9470   °[facility] [address] [place], [place] 336-227-7417 Accepts Medicaid  °[facility] [address]  ° [place] 1-800-659-3381 Substance Abuse/Addiction Treatment  ° °[place] Behavioral Health Resources °Organization         Address  Phone  Notes  °[facility]  (888) 581-9988   °Julie Brannon, PhD [address] [place], [place]   (336) 349-5553 or (336) 951-0000   °[facility]   [address] °[place], [place] (336) 349-4454   °[facility] [address], [place], [place] (336) 342-8316 Insurance/Medicaid/sponsorship through [facility]  °[facility] [address]                                    [place], [place] (336) 342-8316 Therapy/tele-psych/case    °[facility] [address]  ° , [place] (336) 349-2233    °Dr. Arfeen  (336) 349-4544   °[facility] [place]  [employer] [place] [facility] 1) [address],  °2) [address], [place] °3)  [address], [place] (336) 349-3220 °(336) 342-7768 ° °(336) 342-8140   °[place] [facility] (336) 342-1394 or (336) 342-3537 (After Hours)    ° ° °

## 2014-04-09 NOTE — ED Notes (Signed)
Pt ambulated to the restroom but was unable to obtain one at this time.

## 2014-04-09 NOTE — ED Notes (Signed)
Patient irritable, agitated, guarded; argumentative.. Denies SI, HI, AVH. Training and development officerTells writer to leave her alone, "get out" repeatedly. States "All I needed was an Ativan. I've taken it for ten years. I am not addicted. If I got Ativan I could have walked out of here safely. Only dead people need it because they're addicted". Appears to be responding to internal stimuli.  Encouragement offered. Given water, Gatorade.  Q 15 safety checks in place.  Glasses at bedside.

## 2014-04-09 NOTE — Progress Notes (Signed)
CSW received consult for transportation assistance. CSW met with pt at bedside. Pt stated she did not need anything. Pt shared her house is under foreclosure so she was going to a friends house. Pt states she has to call the friend back about a ride. CSW encouraged patient to call her friend for a ride and let CSW if pt needs any further assistance. Patient confirmed she was going to friends house in [place].   Noreene Larsson [phone]  ED CSW 04/09/2014 0832am

## 2014-04-09 NOTE — ED Notes (Signed)
Labile. Argumentative, signing loudly, restless/pacing.

## 2014-04-09 NOTE — ED Notes (Signed)
Patient denies SI, HI at present. Reports AH. Reports feeling depressed with poor sleep. "I feel like I won't wake up". Patient appears groggy.   Encouragement offered.   Q 15 safety checks in place.

## 2014-04-09 NOTE — ED Notes (Addendum)
Pt was just released from Psych ED this morning, pt called [employer] requesting transport to ED, pt brought to ED. Pt denies SI/HI. Pt is talking randomly about different things. Pt called staff "idiots and jerk". Pt is yelling in a loud voice at staff.

## 2014-04-09 NOTE — ED Notes (Signed)
Pt being discharged home today per provider orders. Public transit means of transportation. Pt given discharge orders and follow up information. Pt given the opportunity to express concerns and ask questions. Pt currently stable. Denies SI/HI and A/VH.

## 2014-04-09 NOTE — ED Notes (Signed)
Pt very uncooperative when trying to get her changed into scrubs and draw blood. MD and security called to beside to assist. Pt finally changed, and wanded by security. 2 pt belonging bags behind nurses station. Pt then walked to the restroom in attempt to obtain a urine sample where she came out of the restroom with stool and tissue in her urine cup and topless. Pt escorted back to her room, assisted into her bed, bed rails up and lights off.

## 2014-04-09 NOTE — ED Notes (Signed)
Pt. belongings are in locker # 29. Nurse was notified.

## 2014-04-09 NOTE — ED Notes (Addendum)
Pt discharged from [facility] this morning. Pt back in ED with sevaral bags of clothing. rn asked pt if she was suicidal, pt reported "no but my middle finger feels like it wants to masturbate, so can you give me a private room so I can do that". rn informed pt she was not allowed to do that at this time. Pt denied HI. rn could not confirm if pt had AH/VH. Pt yelling at nurse and talking about "morphine into clothes".

## 2014-04-10 DIAGNOSIS — F28 Other psychotic disorder not due to a substance or known physiological condition: Secondary | ICD-10-CM | POA: Diagnosis not present

## 2014-04-10 DIAGNOSIS — F1994 Other psychoactive substance use, unspecified with psychoactive substance-induced mood disorder: Secondary | ICD-10-CM

## 2014-04-10 DIAGNOSIS — F3160 Bipolar disorder, current episode mixed, unspecified: Secondary | ICD-10-CM

## 2014-04-10 MED ORDER — NICOTINE 21 MG/24HR TD PT24
21.0000 mg | MEDICATED_PATCH | Freq: Once | TRANSDERMAL | Status: DC
  Administered 2014-04-10: 21 mg via TRANSDERMAL
  Filled 2014-04-10: qty 1

## 2014-04-10 MED ORDER — PANTOPRAZOLE SODIUM 40 MG PO TBEC
40.0000 mg | DELAYED_RELEASE_TABLET | Freq: Every day | ORAL | Status: DC
  Administered 2014-04-10: 40 mg via ORAL
  Filled 2014-04-10: qty 1

## 2014-04-10 MED ORDER — BENZTROPINE MESYLATE 1 MG PO TABS
0.5000 mg | ORAL_TABLET | Freq: Every day | ORAL | Status: DC
  Filled 2014-04-10: qty 1

## 2014-04-10 MED ORDER — NAPROXEN 500 MG PO TABS
500.0000 mg | ORAL_TABLET | Freq: Two times a day (BID) | ORAL | Status: DC
  Filled 2014-04-10: qty 1

## 2014-04-10 MED ORDER — RISPERIDONE 0.5 MG PO TABS
0.5000 mg | ORAL_TABLET | Freq: Two times a day (BID) | ORAL | Status: DC
  Administered 2014-04-10: 0.5 mg via ORAL
  Filled 2014-04-10: qty 1

## 2014-04-10 MED ORDER — IBUPROFEN 200 MG PO TABS
600.0000 mg | ORAL_TABLET | Freq: Four times a day (QID) | ORAL | Status: DC | PRN
  Administered 2014-04-10: 600 mg via ORAL
  Filled 2014-04-10: qty 3

## 2014-04-10 NOTE — BH Assessment (Signed)
[facility]  04/10/14 per Kathlene NovemberMike, [facility] wt list. IVC to be determined after rounding.

## 2014-04-10 NOTE — Consult Note (Signed)
[facility] Face-to-Face Psychiatry Consult   Reason for Consult:  Bizarre behavior Referring Physician:  EDP  Tiffany [name] is an 48 y.o. female. Total Time spent with patient: 45 minutes  Assessment: AXIS I:  Bipolar, mixed, Substance Abuse and Substance Induced Mood Disorder AXIS II:  Deferred AXIS III:   Past Medical History  Diagnosis Date  . Bipolar disorder   . Anxiety   . Depression   . Auditory hallucination   . Hallucination, visual   . Psychiatric hospitalization   . Tubal ectopic pregnancy   . GERD (gastroesophageal reflux disease)   . Hepatitis C carrier   . Herpes simplex virus infection     history of  . H/O: eczema   . Anxiety   . Alcohol abuse   . Tobacco abuse   . Schizophrenia    AXIS IV:  other psychosocial or environmental problems AXIS V:  41-50 serious symptoms  Plan:  Recommend psychiatric Inpatient admission when medically cleared.  Subjective:   Tiffany [name] is a 48 y.o. female patient presents to [facility] related to bizarre behavior.    HPI:  Patient was discharged from [facility] yesterday morning.  Patient has had multiple visits to the [facility] with similar related complaints to bizarre behavior.  Patient interview patient present coherent and organized.  Patient states that she is homeless at this time because her mother's house is going into foreclosure after her death and she has not been able to make payments on house of utilities.  Patient states that she has been off of her medication but has an appointment coming up January 7th with Dr. Olena Heckle.  Patient denies suicidal/homicidal ideation, psychosis, and paranoia. Patient also states that she feels that she needs to get back on her medications states that she is unable to cope once she is on the street.  States that she has taken ativan in the past and it has helped to get her thoughts clear and last had ativan 03/23/14.   HPI Elements:   Location:  Bizzare behavior. Quality:  anxiety. Severity:  bizzare  behavior. Timing:  several weeks. Review of Systems  Psychiatric/Behavioral: Positive for depression and substance abuse. Negative for suicidal ideas and memory loss. Hallucinations: denies. The patient is nervous/anxious. The patient does not have insomnia.   All other systems reviewed and are negative.  Family History  Problem Relation Age of Onset  . Hypertension    . COPD Father     Past Psychiatric History: Past Medical History  Diagnosis Date  . Bipolar disorder   . Anxiety   . Depression   . Auditory hallucination   . Hallucination, visual   . Psychiatric hospitalization   . Tubal ectopic pregnancy   . GERD (gastroesophageal reflux disease)   . Hepatitis C carrier   . Herpes simplex virus infection     history of  . H/O: eczema   . Anxiety   . Alcohol abuse   . Tobacco abuse   . Schizophrenia     reports that she has been smoking Cigarettes.  She has a 30 pack-year smoking history. She has never used smokeless tobacco. She reports that she drinks alcohol. She reports that she uses illicit drugs (Marijuana). Family History  Problem Relation Age of Onset  . Hypertension    . COPD Father    Family History Substance Abuse: No Family Supports: Yes, List: Living Arrangements: Alone Can pt return to current living arrangement?: Yes Abuse/Neglect [facility]) Physical Abuse: Denies Verbal Abuse: Denies  Sexual Abuse: Denies Allergies:   Allergies  Allergen Reactions  . Sulfa Antibiotics Nausea And Vomiting  . Hydrocodone Other (See Comments)     this upsets stomach.    ACT Assessment Complete:  Yes:    Educational Status    Risk to Self: Risk to self with the past 6 months Suicidal Ideation: No Suicidal Intent: No Is patient at risk for suicide?: No Suicidal Plan?: No Access to Means: No What has been your use of drugs/alcohol within the last 12 months?:  (n/a) Previous Attempts/Gestures: No How many times?:  (n/a) Other Self Harm Risks:  (none reported  ) Triggers for Past Attempts: None known Intentional Self Injurious Behavior: None Family Suicide History: No Recent stressful life event(s):  (none noted ) Persecutory voices/beliefs?: No Depression: Yes Depression Symptoms: Feeling angry/irritable, Loss of interest in usual pleasures, Tearfulness, Despondent, Fatigue Substance abuse history and/or treatment for substance abuse?: Yes Suicide prevention information given to non-admitted patients: Not applicable  Risk to Others: Risk to Others within the past 6 months Homicidal Ideation: No Thoughts of Harm to Others: No Current Homicidal Intent: No Current Homicidal Plan: No Access to Homicidal Means: No Identified Victim:  (n/a) History of harm to others?: No Assessment of Violence: None Noted Violent Behavior Description:  (patient calm and cooperative ) Does patient have access to weapons?: No Criminal Charges Pending?: Yes Describe Pending Criminal Charges:  ([place]) Does patient have a court date: Yes Court Date:  (patient unsure of court date)  Abuse: Abuse/Neglect Assessment (Assessment to be complete while patient is alone) Physical Abuse: Denies Verbal Abuse: Denies Sexual Abuse: Denies Exploitation of patient/patient's resources: Denies Self-Neglect: Denies  Prior Inpatient Therapy: Prior Inpatient Therapy Prior Inpatient Therapy: Yes Prior Therapy Dates: 1.5 yrs ago Prior Therapy Facilty/Provider(s): [facility] Reason for Treatment: anxiety  Prior Outpatient Therapy: Prior Outpatient Therapy Prior Outpatient Therapy: No Prior Therapy Dates: NA Prior Therapy Facilty/Provider(s): NA Reason for Treatment: NA  Additional Information: Additional Information 1:1 In Past 12 Months?: No CIRT Risk: No Elopement Risk: No Does patient have medical clearance?: Yes                  Objective: Blood pressure 112/56, pulse 92, temperature 98.3 F (36.8 C), temperature source Oral, resp. rate 19, SpO2 98 %.There  is no weight on file to calculate BMI. Results for orders placed or performed during the hospital encounter of 04/09/14 (from the past 72 hour(s))  Acetaminophen level     Status: Abnormal   Collection Time: 04/09/14  4:33 PM  Result Value Ref Range   Acetaminophen (Tylenol), Serum <10.0 (L) 10 - 30 ug/mL    Comment:        THERAPEUTIC CONCENTRATIONS VARY SIGNIFICANTLY. A RANGE OF 10-30 ug/mL MAY BE AN EFFECTIVE CONCENTRATION FOR MANY PATIENTS. HOWEVER, SOME ARE BEST TREATED AT CONCENTRATIONS OUTSIDE THIS RANGE. ACETAMINOPHEN CONCENTRATIONS >150 ug/mL AT 4 HOURS AFTER INGESTION AND >50 ug/mL AT 12 HOURS AFTER INGESTION ARE OFTEN ASSOCIATED WITH TOXIC REACTIONS.   CBC     Status: Abnormal   Collection Time: 04/09/14  4:33 PM  Result Value Ref Range   WBC 5.5 4.0 - 10.5 K/uL   RBC 3.85 (L) 3.87 - 5.11 MIL/uL   Hemoglobin 12.5 12.0 - 15.0 g/dL   HCT 35.3 (L) 36.0 - 46.0 %   MCV 91.7 78.0 - 100.0 fL   MCH 32.5 26.0 - 34.0 pg   MCHC 35.4 30.0 - 36.0 g/dL   RDW 13.2 11.5 - 15.5 %  Platelets 353 150 - 400 K/uL  Comprehensive metabolic panel     Status: Abnormal   Collection Time: 04/09/14  4:33 PM  Result Value Ref Range   Sodium 125 (L) 135 - 145 mmol/L    Comment: Please note change in reference range.   Potassium 3.7 3.5 - 5.1 mmol/L    Comment: Please note change in reference range.   Chloride 90 (L) 96 - 112 mEq/L   CO2 26 19 - 32 mmol/L   Glucose, Bld 105 (H) 70 - 99 mg/dL   BUN 7 6 - 23 mg/dL   Creatinine, Ser 0.70 0.50 - 1.10 mg/dL   Calcium 9.3 8.4 - 10.5 mg/dL   Total Protein 7.6 6.0 - 8.3 g/dL   Albumin 4.6 3.5 - 5.2 g/dL   AST 27 0 - 37 U/L   ALT 23 0 - 35 U/L   Alkaline Phosphatase 118 (H) 39 - 117 U/L   Total Bilirubin 0.4 0.3 - 1.2 mg/dL   GFR calc non Af Amer >90 >90 mL/min   GFR calc Af Amer >90 >90 mL/min    Comment: (NOTE) The eGFR has been calculated using the CKD EPI equation. This calculation has not been validated in all clinical  situations. eGFR's persistently <90 mL/min signify possible Chronic Kidney Disease.    Anion gap 9 5 - 15  Ethanol (ETOH)     Status: None   Collection Time: 04/09/14  4:33 PM  Result Value Ref Range   Alcohol, Ethyl (B) <5 0 - 9 mg/dL    Comment:        LOWEST DETECTABLE LIMIT FOR SERUM ALCOHOL IS 11 mg/dL FOR MEDICAL PURPOSES ONLY   Salicylate level     Status: None   Collection Time: 04/09/14  4:33 PM  Result Value Ref Range   Salicylate Lvl <2.4 2.8 - 20.0 mg/dL  Urine Drug Screen     Status: Abnormal   Collection Time: 04/09/14  4:38 PM  Result Value Ref Range   Opiates NONE DETECTED NONE DETECTED   Cocaine POSITIVE (A) NONE DETECTED   Benzodiazepines POSITIVE (A) NONE DETECTED   Amphetamines NONE DETECTED NONE DETECTED   Tetrahydrocannabinol POSITIVE (A) NONE DETECTED   Barbiturates NONE DETECTED NONE DETECTED    Comment:        DRUG SCREEN FOR MEDICAL PURPOSES ONLY.  IF CONFIRMATION IS NEEDED FOR ANY PURPOSE, NOTIFY LAB WITHIN 5 DAYS.        LOWEST DETECTABLE LIMITS FOR URINE DRUG SCREEN Drug Class       Cutoff (ng/mL) Amphetamine      1000 Barbiturate      200 Benzodiazepine   580 Tricyclics       998 Opiates          300 Cocaine          300 THC              50    Labs are reviewed see values above.  Medications reviewed Risperdal 0.5 mg bid and Cogentin 0.5 mg daily  Current Facility-Administered Medications  Medication Dose Route Frequency Provider Last Rate Last Dose  . hydrOXYzine (ATARAX/VISTARIL) tablet 25 mg  25 mg Oral Q6H PRN Laverle Hobby, PA-C   25 mg at 04/10/14 0744  . ibuprofen (ADVIL,MOTRIN) tablet 600 mg  600 mg Oral Q6H PRN Shuvon Rankin, NP   600 mg at 04/10/14 1057  . traZODone (DESYREL) tablet 50 mg  50 mg Oral QHS PRN,MR X 1  Laverle Hobby, PA-C       Current Outpatient Prescriptions  Medication Sig Dispense Refill  . buPROPion (WELLBUTRIN XL) 150 MG 24 hr tablet Take 150 mg by mouth daily.    Marland Kitchen buPROPion (WELLBUTRIN XL) 300 MG  24 hr tablet Take 300 mg by mouth daily.    . hydrOXYzine (ATARAX/VISTARIL) 50 MG tablet Take 1 tablet (50 mg total) by mouth every 6 (six) hours as needed for anxiety. 30 tablet 0  . naproxen (NAPROSYN) 500 MG tablet Take 500 mg by mouth 2 (two) times daily with a meal.    . omeprazole (PRILOSEC) 40 MG capsule Take 40 mg by mouth daily.    . traZODone (DESYREL) 150 MG tablet Take 150 mg by mouth at bedtime.    . ziprasidone (GEODON) injection Inject 20 mg into the muscle once.    . carbamazepine (TEGRETOL) 200 MG tablet Take 200 mg by mouth 2 (two) times daily.    Marland Kitchen LORazepam (ATIVAN) 1 MG tablet Take 1 mg by mouth every 6 (six) hours as needed for anxiety.    . risperiDONE (RISPERDAL) 0.5 MG tablet Take 1 tablet (0.5 mg total) by mouth 2 (two) times daily. (Patient not taking: Reported on 04/09/2014) 60 tablet 0    Psychiatric Specialty Exam:     Blood pressure 112/56, pulse 92, temperature 98.3 F (36.8 C), temperature source Oral, resp. rate 19, SpO2 98 %.There is no weight on file to calculate BMI.  General Appearance: Casual  Eye Contact::  Good  Speech:  Clear and Coherent and Normal Rate  Volume:  Normal  Mood:  Anxious  Affect:  Congruent  Thought Process:  Circumstantial and Goal Directed  Orientation:  Full (Time, Place, and Person)  Thought Content:  Rumination  Suicidal Thoughts:  No  Homicidal Thoughts:  No  Memory:  Immediate;   Good Recent;   Good Remote;   Good  Judgement:  Impaired  Insight:  Lacking  Psychomotor Activity:  Restlessness  Concentration:  Fair  Recall:  Good  Fund of Knowledge:Fair  Language: Good  Akathisia:  No  Handed:  Right  AIMS (if indicated):     Assets:  Communication Skills Desire for Improvement  Sleep:      Musculoskeletal: Strength & Muscle Tone: within normal limits Gait & Station: normal Patient leans: N/A  Treatment Plan Summary: Daily contact with patient to assess and evaluate symptoms and progress in  treatment Medication management Inpatient treatment recommended  Earleen Newport, FNP-BC 04/10/2014 1:11 PM   Case discussed with me as above

## 2014-04-10 NOTE — ED Notes (Signed)
[facility].  Left the unit [facility]ulatory with [employer].

## 2014-04-10 NOTE — ED Notes (Signed)
Patient up and restless on the unit.  Was given some hard candies to help with dryness in mouth.  She is mildly confused and does not always seem to understand what is being said to her.

## 2014-04-10 NOTE — ED Notes (Signed)
Patient with frequent requests for drinks.  We have explained to patient that she in on a fluid restriction due to her sodium level.  After continued requests for fluids I printed out a patient information sheet ion hyponatremia and discussed it with her.  She was able to verbalize understanding of the information provided and asked if she might be allowed to have some hard candies.  I did agree to seek some out for her.

## 2014-04-10 NOTE — ED Notes (Signed)
Report called to Cleta AlbertsJoyce Adams, RN at [facility] in [place].  Awaiting completion of EMTALA form by MD.

## 2014-04-10 NOTE — BH Assessment (Signed)
[facility] Assessment Progress Note Per jonathan, pt accepted by Dr Betti Cruzeddy to [facility] to room 121. Call report number is [phone]. Pt can be transported by [employer].

## 2014-04-25 ENCOUNTER — Ambulatory Visit (HOSPITAL_COMMUNITY): Payer: Self-pay | Admitting: Psychiatry

## 2014-05-09 ENCOUNTER — Ambulatory Visit (HOSPITAL_COMMUNITY): Payer: Self-pay | Admitting: Licensed Clinical Social Worker

## 2014-05-16 ENCOUNTER — Ambulatory Visit (INDEPENDENT_AMBULATORY_CARE_PROVIDER_SITE_OTHER): Payer: Medicare Other | Admitting: Psychiatry

## 2014-05-16 ENCOUNTER — Encounter (INDEPENDENT_AMBULATORY_CARE_PROVIDER_SITE_OTHER): Payer: Self-pay

## 2014-05-16 ENCOUNTER — Encounter (HOSPITAL_COMMUNITY): Payer: Self-pay | Admitting: Psychiatry

## 2014-05-16 VITALS — BP 140/80 | HR 88 | Ht 62.0 in | Wt 134.0 lb

## 2014-05-16 DIAGNOSIS — F1994 Other psychoactive substance use, unspecified with psychoactive substance-induced mood disorder: Secondary | ICD-10-CM

## 2014-05-16 DIAGNOSIS — F250 Schizoaffective disorder, bipolar type: Secondary | ICD-10-CM

## 2014-05-16 MED ORDER — DIPHENHYDRAMINE HCL 25 MG PO TABS: 50.0000 mg | ORAL_TABLET | Freq: Every evening | ORAL | Status: DC | PRN

## 2014-05-16 MED ORDER — BUPROPION HCL ER (XL) 450 MG PO TB24: 450.0000 mg | ORAL_TABLET | Freq: Every day | ORAL | Status: DC

## 2014-05-16 MED ORDER — DIVALPROEX SODIUM 500 MG PO DR TAB: 500.0000 mg | DELAYED_RELEASE_TABLET | Freq: Two times a day (BID) | ORAL | Status: DC

## 2014-05-16 MED ORDER — LURASIDONE HCL 40 MG PO TABS: 40.0000 mg | ORAL_TABLET | Freq: Every day | ORAL | Status: DC

## 2014-05-16 NOTE — Progress Notes (Signed)
Patient ID: Tiffany [name], female   DOB: 1966/04/04, 49 y.o.   MRN: [id number]  [facility]   Tiffany [name] [id number] 49 y.o.  05/16/2014 12:21 PM  Chief Complaint:  Depression   History of Present Illness:   Patient Presents for Initial Evaluation with symptoms of depression. He is a 49 years old currently single Caucasian female who is living with her friend. She has had long history of psychiatric condition and being diagnosed with bipolar possible schizoaffective disorder she has been with [facility] for 5 years but was not able to pay for the no-show appointments.  She has been on multiple medications in the past and has been in and out of hospital says that her hospital frequency is around 2 times every year for the last 10:15 years. She is a recent hospital discharge from [facility] prior to that she was at [facility] and presented with depression and hopelessness. She was positive for marijuana and cocaine she was hospitalized. In the hospital she has been put on Depakote 1000 mg. Wellbutrin 450 mg. Hydroxyzine she continues to take trazodone. Says she has been on Tegretol in the past Risperdal and Zyprexa for one reason or the other or because of side effects including weight gain she did not wanted to continue them.  She has been on different medications that and believes that she needs to be on some medications for focusing and anxiety she has been on Ativan in the past from her primary care doctor. I cautioned that considering her condition of bipolar that needs to be addressed and also her history of using alcohol and using drugs he would be cautious not to give her anything that would be habit-forming or have any addiction potential. She agreed with the plan and continued the interview.  Currently she endorses hearing some voice or her subconscious repeating her thoughts or thinking about the loved ones. She has lost her  brother and also her mother in the past she gets paranoid but does not endorse command hallucinations she gets worried and when she is on the depressed side the voices or the repetitive toss gets worse. She did get somewhat agitated when he talked about not being put on Ativan or anything addicting.  Severity of depression as 6 out of 10. 10 being no depression  She did endorse having mood swings getting frustrated or agitated easily with racing thoughts and difficulty focusing and getting distracted easily. She has been diagnosed bipolar or schizoaffective disorder according to her by Dr. Reece Levy. Mostly she is presented with depression for which reason she gets admitted for mood swings With aggravating factors; she is living with her friend but may become homeless when she gets homeless it draws are into drugs or risky people with involvment with drugs.   Modifying factors; or 63 years old son whom she keeps in touch.  She has been using alcohol up to 6 beers every weekend says that she has not drank any since December. She says that when she is drinking high she is probably drinking every second or third day 59. But believes she does not need any help for stopping alcohol she relates it more to  circumstantial and she grew up with his family there was a lot of alcohol.    Past Psychiatric History/Hospitalization(s) Multiple with depression, suicidal toughts and drug/alcohol use with mood swings.  Hospitalization for psychiatric illness: Yes History of Electroconvulsive Shock Therapy: No Prior Suicide Attempts:  suicidal toughts with depression.  Medical History; Past Medical History  Diagnosis Date  . Bipolar disorder   . Anxiety   . Depression   . Auditory hallucination   . Hallucination, visual   . Psychiatric hospitalization   . Tubal ectopic pregnancy   . GERD (gastroesophageal reflux disease)   . Hepatitis C carrier   . Herpes simplex virus infection     history of  . H/O:  eczema   . Anxiety   . Alcohol abuse   . Tobacco abuse   . Schizophrenia     Allergies: Allergies  Allergen Reactions  . Sulfa Antibiotics Nausea And Vomiting  . Hydrocodone Other (See Comments)     this upsets stomach.    Medications: Outpatient Encounter Prescriptions as of 05/16/2014  Medication Sig  . buPROPion 450 MG TB24 Take 450 mg by mouth daily.  . carbamazepine (TEGRETOL) 200 MG tablet Take 200 mg by mouth 2 (two) times daily.  . diphenhydrAMINE (BENADRYL) 25 MG tablet Take 2 tablets (50 mg total) by mouth at bedtime as needed for itching.  . divalproex (DEPAKOTE) 500 MG DR tablet Take 1 tablet (500 mg total) by mouth 2 (two) times daily.  Marland Kitchen lurasidone (LATUDA) 40 MG TABS tablet Take 1 tablet (40 mg total) by mouth daily with breakfast.  . naproxen (NAPROSYN) 500 MG tablet Take 500 mg by mouth 2 (two) times daily with a meal.  . omeprazole (PRILOSEC) 40 MG capsule Take 40 mg by mouth daily.  . traZODone (DESYREL) 150 MG tablet Take 150 mg by mouth at bedtime.  . ziprasidone (GEODON) injection Inject 20 mg into the muscle once.  . [DISCONTINUED] buPROPion (WELLBUTRIN XL) 150 MG 24 hr tablet Take 150 mg by mouth daily.  . [DISCONTINUED] buPROPion (WELLBUTRIN XL) 300 MG 24 hr tablet Take 300 mg by mouth daily.  . [DISCONTINUED] hydrOXYzine (ATARAX/VISTARIL) 50 MG tablet Take 1 tablet (50 mg total) by mouth every 6 (six) hours as needed for anxiety.  . [DISCONTINUED] risperiDONE (RISPERDAL) 0.5 MG tablet Take 1 tablet (0.5 mg total) by mouth 2 (two) times daily. (Patient not taking: Reported on 04/09/2014)     Substance Abuse History: Denies regular use of marijuana. Says she was given by that person when she was homeless. Denies using cocaine either but was positive prior last admission. Admits to alcohol upto 5 to 7 beer a times when drinks. Last use was in December   Family History; Family History  Problem Relation Age of Onset  . Hypertension    . COPD Father   .  Alcohol abuse Father   . Anxiety disorder Mother       Biopsychosocial History:   She grew up with her mom but describes her mom to have severe anxiety and says that her anxiety was transformed into work she grew up in panic because of her condition at home and also mom suffering from anxiety. 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 She is married in the past but says it is a mistake. She has a 7 year old son whom she keeps in touch. Currently she is on disability for her psychiatric condition. She is currently living with her friend and is applying for section 8  Labs:  Recent Results (from the past 2160 hour(s))  CBC with Differential     Status: None   Collection Time: 03/28/14 11:42 AM  Result Value Ref Range   WBC 5.0  4.0 - 10.5 K/uL   RBC 4.28 3.87 - 5.11 MIL/uL   Hemoglobin 13.8 12.0 - 15.0 g/dL   HCT 39.5 36.0 - 46.0 %   MCV 92.3 78.0 - 100.0 fL   MCH 32.2 26.0 - 34.0 pg   MCHC 34.9 30.0 - 36.0 g/dL   RDW 13.6 11.5 - 15.5 %   Platelets 245 150 - 400 K/uL   Neutrophils Relative % 60 43 - 77 %   Neutro Abs 3.0 1.7 - 7.7 K/uL   Lymphocytes Relative 28 12 - 46 %   Lymphs Abs 1.4 0.7 - 4.0 K/uL   Monocytes Relative 10 3 - 12 %   Monocytes Absolute 0.5 0.1 - 1.0 K/uL   Eosinophils Relative 2 0 - 5 %   Eosinophils Absolute 0.1 0.0 - 0.7 K/uL   Basophils Relative 0 0 - 1 %   Basophils Absolute 0.0 0.0 - 0.1 K/uL  Comprehensive metabolic panel     Status: Abnormal   Collection Time: 03/28/14 11:42 AM  Result Value Ref Range   Sodium 129 (L) 137 - 147 mEq/L   Potassium 3.9 3.7 - 5.3 mEq/L   Chloride 88 (L) 96 - 112 mEq/L   CO2 23 19 - 32 mEq/L   Glucose, Bld 111 (H) 70 - 99 mg/dL   BUN 3 (L) 6 - 23 mg/dL   Creatinine, Ser 0.58 0.50 - 1.10 mg/dL   Calcium 10.1 8.4 - 10.5 mg/dL   Total Protein 8.4 (H) 6.0 - 8.3 g/dL   Albumin 4.9 3.5 - 5.2 g/dL   AST 27 0 - 37 U/L   ALT 18 0 - 35 U/L   Alkaline Phosphatase  129 (H) 39 - 117 U/L   Total Bilirubin 0.2 (L) 0.3 - 1.2 mg/dL   GFR calc non Af Amer >90 >90 mL/min   GFR calc Af Amer >90 >90 mL/min    Comment: (NOTE) The eGFR has been calculated using the CKD EPI equation. This calculation has not been validated in all clinical situations. eGFR's persistently <90 mL/min signify possible Chronic Kidney Disease.    Anion gap 18 (H) 5 - 15  Ethanol     Status: None   Collection Time: 03/28/14 11:42 AM  Result Value Ref Range   Alcohol, Ethyl (B) <11 0 - 11 mg/dL    Comment:        LOWEST DETECTABLE LIMIT FOR SERUM ALCOHOL IS 11 mg/dL FOR MEDICAL PURPOSES ONLY   Salicylate level     Status: Abnormal   Collection Time: 03/28/14 11:42 AM  Result Value Ref Range   Salicylate Lvl <9.7 (L) 2.8 - 20.0 mg/dL  Acetaminophen level     Status: None   Collection Time: 03/28/14 11:42 AM  Result Value Ref Range   Acetaminophen (Tylenol), Serum <15.0 10 - 30 ug/mL    Comment:        THERAPEUTIC CONCENTRATIONS VARY SIGNIFICANTLY. A RANGE OF 10-30 ug/mL MAY BE AN EFFECTIVE CONCENTRATION FOR MANY PATIENTS. HOWEVER, SOME ARE BEST TREATED AT CONCENTRATIONS OUTSIDE THIS RANGE. ACETAMINOPHEN CONCENTRATIONS >150 ug/mL AT 4 HOURS AFTER INGESTION AND >50 ug/mL AT 12 HOURS AFTER INGESTION ARE OFTEN ASSOCIATED WITH TOXIC REACTIONS.   Carbamazepine level, total     Status: None   Collection Time: 03/28/14 11:42 AM  Result Value Ref Range   Carbamazepine Lvl 7.5 4.0 - 12.0 ug/mL    Comment: Performed at [facility] metabolic panel     Status: Abnormal  Collection Time: 03/28/14  5:58 PM  Result Value Ref Range   Sodium 129 (L) 137 - 147 mEq/L   Potassium 4.2 3.7 - 5.3 mEq/L   Chloride 92 (L) 96 - 112 mEq/L   CO2 20 19 - 32 mEq/L   Glucose, Bld 130 (H) 70 - 99 mg/dL   BUN 4 (L) 6 - 23 mg/dL   Creatinine, Ser 0.57 0.50 - 1.10 mg/dL   Calcium 8.7 8.4 - 10.5 mg/dL   GFR calc non Af Amer >90 >90 mL/min   GFR calc Af Amer >90 >90 mL/min     Comment: (NOTE) The eGFR has been calculated using the CKD EPI equation. This calculation has not been validated in all clinical situations. eGFR's persistently <90 mL/min signify possible Chronic Kidney Disease.    Anion gap 17 (H) 5 - 15  Drug screen panel, emergency     Status: None   Collection Time: 03/28/14  8:18 PM  Result Value Ref Range   Opiates NONE DETECTED NONE DETECTED   Cocaine NONE DETECTED NONE DETECTED   Benzodiazepines NONE DETECTED NONE DETECTED   Amphetamines NONE DETECTED NONE DETECTED   Tetrahydrocannabinol NONE DETECTED NONE DETECTED   Barbiturates NONE DETECTED NONE DETECTED    Comment:        DRUG SCREEN FOR MEDICAL PURPOSES ONLY.  IF CONFIRMATION IS NEEDED FOR ANY PURPOSE, NOTIFY LAB WITHIN 5 DAYS.        LOWEST DETECTABLE LIMITS FOR URINE DRUG SCREEN Drug Class       Cutoff (ng/mL) Amphetamine      1000 Barbiturate      200 Benzodiazepine   562 Tricyclics       563 Opiates          300 Cocaine          300 THC              50   Comprehensive metabolic panel     Status: Abnormal   Collection Time: 03/29/14  8:53 PM  Result Value Ref Range   Sodium 132 (L) 137 - 147 mEq/L   Potassium 4.2 3.7 - 5.3 mEq/L   Chloride 94 (L) 96 - 112 mEq/L   CO2 23 19 - 32 mEq/L   Glucose, Bld 123 (H) 70 - 99 mg/dL   BUN 5 (L) 6 - 23 mg/dL   Creatinine, Ser 0.59 0.50 - 1.10 mg/dL   Calcium 9.6 8.4 - 10.5 mg/dL   Total Protein 7.5 6.0 - 8.3 g/dL   Albumin 4.1 3.5 - 5.2 g/dL   AST 27 0 - 37 U/L   ALT 19 0 - 35 U/L   Alkaline Phosphatase 110 39 - 117 U/L   Total Bilirubin 0.2 (L) 0.3 - 1.2 mg/dL   GFR calc non Af Amer >90 >90 mL/min   GFR calc Af Amer >90 >90 mL/min    Comment: (NOTE) The eGFR has been calculated using the CKD EPI equation. This calculation has not been validated in all clinical situations. eGFR's persistently <90 mL/min signify possible Chronic Kidney Disease.    Anion gap 15 5 - 15  Urine Drug Screen     Status: None   Collection Time:  04/01/14  2:19 PM  Result Value Ref Range   Opiates NONE DETECTED NONE DETECTED   Cocaine NONE DETECTED NONE DETECTED   Benzodiazepines NONE DETECTED NONE DETECTED   Amphetamines NONE DETECTED NONE DETECTED   Tetrahydrocannabinol NONE DETECTED NONE DETECTED   Barbiturates NONE DETECTED  NONE DETECTED    Comment:        DRUG SCREEN FOR MEDICAL PURPOSES ONLY.  IF CONFIRMATION IS NEEDED FOR ANY PURPOSE, NOTIFY LAB WITHIN 5 DAYS.        LOWEST DETECTABLE LIMITS FOR URINE DRUG SCREEN Drug Class       Cutoff (ng/mL) Amphetamine      1000 Barbiturate      200 Benzodiazepine   110 Tricyclics       211 Opiates          300 Cocaine          300 THC              50   Acetaminophen level     Status: None   Collection Time: 04/01/14  2:38 PM  Result Value Ref Range   Acetaminophen (Tylenol), Serum <15.0 10 - 30 ug/mL    Comment:        THERAPEUTIC CONCENTRATIONS VARY SIGNIFICANTLY. A RANGE OF 10-30 ug/mL MAY BE AN EFFECTIVE CONCENTRATION FOR MANY PATIENTS. HOWEVER, SOME ARE BEST TREATED AT CONCENTRATIONS OUTSIDE THIS RANGE. ACETAMINOPHEN CONCENTRATIONS >150 ug/mL AT 4 HOURS AFTER INGESTION AND >50 ug/mL AT 12 HOURS AFTER INGESTION ARE OFTEN ASSOCIATED WITH TOXIC REACTIONS.   CBC     Status: Abnormal   Collection Time: 04/01/14  2:38 PM  Result Value Ref Range   WBC 12.3 (H) 4.0 - 10.5 K/uL   RBC 4.27 3.87 - 5.11 MIL/uL   Hemoglobin 13.9 12.0 - 15.0 g/dL   HCT 39.7 36.0 - 46.0 %   MCV 93.0 78.0 - 100.0 fL   MCH 32.6 26.0 - 34.0 pg   MCHC 35.0 30.0 - 36.0 g/dL   RDW 13.7 11.5 - 15.5 %   Platelets 259 150 - 400 K/uL  Comprehensive metabolic panel     Status: Abnormal   Collection Time: 04/01/14  2:38 PM  Result Value Ref Range   Sodium 129 (L) 137 - 147 mEq/L   Potassium 4.2 3.7 - 5.3 mEq/L   Chloride 89 (L) 96 - 112 mEq/L   CO2 23 19 - 32 mEq/L   Glucose, Bld 110 (H) 70 - 99 mg/dL   BUN 5 (L) 6 - 23 mg/dL   Creatinine, Ser 0.63 0.50 - 1.10 mg/dL   Calcium 10.3 8.4 -  10.5 mg/dL   Total Protein 8.4 (H) 6.0 - 8.3 g/dL   Albumin 4.8 3.5 - 5.2 g/dL   AST 28 0 - 37 U/L   ALT 18 0 - 35 U/L   Alkaline Phosphatase 126 (H) 39 - 117 U/L   Total Bilirubin 0.2 (L) 0.3 - 1.2 mg/dL   GFR calc non Af Amer >90 >90 mL/min   GFR calc Af Amer >90 >90 mL/min    Comment: (NOTE) The eGFR has been calculated using the CKD EPI equation. This calculation has not been validated in all clinical situations. eGFR's persistently <90 mL/min signify possible Chronic Kidney Disease.    Anion gap 17 (H) 5 - 15  Ethanol (ETOH)     Status: None   Collection Time: 04/01/14  2:38 PM  Result Value Ref Range   Alcohol, Ethyl (B) <11 0 - 11 mg/dL    Comment:        LOWEST DETECTABLE LIMIT FOR SERUM ALCOHOL IS 11 mg/dL FOR MEDICAL PURPOSES ONLY   Salicylate level     Status: Abnormal   Collection Time: 04/01/14  2:38 PM  Result Value Ref Range  Salicylate Lvl <0.2 (L) 2.8 - 20.0 mg/dL  CBC with Differential     Status: Abnormal   Collection Time: 04/03/14  1:09 AM  Result Value Ref Range   WBC 8.6 4.0 - 10.5 K/uL   RBC 4.14 3.87 - 5.11 MIL/uL   Hemoglobin 13.1 12.0 - 15.0 g/dL   HCT 37.0 36.0 - 46.0 %   MCV 89.4 78.0 - 100.0 fL   MCH 31.6 26.0 - 34.0 pg   MCHC 35.4 30.0 - 36.0 g/dL   RDW 13.2 11.5 - 15.5 %   Platelets 244 150 - 400 K/uL   Neutrophils Relative % 63 43 - 77 %   Neutro Abs 5.3 1.7 - 7.7 K/uL   Lymphocytes Relative 22 12 - 46 %   Lymphs Abs 1.9 0.7 - 4.0 K/uL   Monocytes Relative 13 (H) 3 - 12 %   Monocytes Absolute 1.1 (H) 0.1 - 1.0 K/uL   Eosinophils Relative 2 0 - 5 %   Eosinophils Absolute 0.2 0.0 - 0.7 K/uL   Basophils Relative 0 0 - 1 %   Basophils Absolute 0.0 0.0 - 0.1 K/uL  Comprehensive metabolic panel     Status: Abnormal   Collection Time: 04/03/14  1:09 AM  Result Value Ref Range   Sodium 123 (L) 137 - 147 mEq/L   Potassium 3.5 (L) 3.7 - 5.3 mEq/L   Chloride 85 (L) 96 - 112 mEq/L   CO2 21 19 - 32 mEq/L   Glucose, Bld 93 70 - 99 mg/dL    BUN 5 (L) 6 - 23 mg/dL   Creatinine, Ser 0.46 (L) 0.50 - 1.10 mg/dL   Calcium 9.5 8.4 - 10.5 mg/dL   Total Protein 7.7 6.0 - 8.3 g/dL   Albumin 4.2 3.5 - 5.2 g/dL   AST 26 0 - 37 U/L   ALT 18 0 - 35 U/L   Alkaline Phosphatase 127 (H) 39 - 117 U/L   Total Bilirubin 0.4 0.3 - 1.2 mg/dL   GFR calc non Af Amer >90 >90 mL/min   GFR calc Af Amer >90 >90 mL/min    Comment: (NOTE) The eGFR has been calculated using the CKD EPI equation. This calculation has not been validated in all clinical situations. eGFR's persistently <90 mL/min signify possible Chronic Kidney Disease.    Anion gap 17 (H) 5 - 15  Ethanol     Status: Abnormal   Collection Time: 04/03/14  1:09 AM  Result Value Ref Range   Alcohol, Ethyl (B) 16 (H) 0 - 11 mg/dL    Comment:        LOWEST DETECTABLE LIMIT FOR SERUM ALCOHOL IS 11 mg/dL FOR MEDICAL PURPOSES ONLY   Drug screen panel, emergency     Status: Abnormal   Collection Time: 04/03/14  2:35 AM  Result Value Ref Range   Opiates NONE DETECTED NONE DETECTED   Cocaine NONE DETECTED NONE DETECTED   Benzodiazepines POSITIVE (A) NONE DETECTED   Amphetamines NONE DETECTED NONE DETECTED   Tetrahydrocannabinol NONE DETECTED NONE DETECTED   Barbiturates NONE DETECTED NONE DETECTED    Comment:        DRUG SCREEN FOR MEDICAL PURPOSES ONLY.  IF CONFIRMATION IS NEEDED FOR ANY PURPOSE, NOTIFY LAB WITHIN 5 DAYS.        LOWEST DETECTABLE LIMITS FOR URINE DRUG SCREEN Drug Class       Cutoff (ng/mL) Amphetamine      1000 Barbiturate      200 Benzodiazepine  716 Tricyclics       967 Opiates          300 Cocaine          300 THC              50   Basic metabolic panel     Status: Abnormal   Collection Time: 04/03/14  4:48 AM  Result Value Ref Range   Sodium 131 (L) 137 - 147 mEq/L    Comment: DELTA CHECK NOTED   Potassium 3.8 3.7 - 5.3 mEq/L   Chloride 97 96 - 112 mEq/L    Comment: DELTA CHECK NOTED   CO2 21 19 - 32 mEq/L   Glucose, Bld 89 70 - 99 mg/dL   BUN 4  (L) 6 - 23 mg/dL   Creatinine, Ser 0.56 0.50 - 1.10 mg/dL   Calcium 8.2 (L) 8.4 - 10.5 mg/dL   GFR calc non Af Amer >90 >90 mL/min   GFR calc Af Amer >90 >90 mL/min    Comment: (NOTE) The eGFR has been calculated using the CKD EPI equation. This calculation has not been validated in all clinical situations. eGFR's persistently <90 mL/min signify possible Chronic Kidney Disease.    Anion gap 13 5 - 15  CBC WITH DIFFERENTIAL     Status: None   Collection Time: 04/09/14  1:22 AM  Result Value Ref Range   WBC 8.2 4.0 - 10.5 K/uL   RBC 4.10 3.87 - 5.11 MIL/uL   Hemoglobin 13.1 12.0 - 15.0 g/dL   HCT 37.2 36.0 - 46.0 %   MCV 90.7 78.0 - 100.0 fL   MCH 32.0 26.0 - 34.0 pg   MCHC 35.2 30.0 - 36.0 g/dL   RDW 13.2 11.5 - 15.5 %   Platelets 337 150 - 400 K/uL   Neutrophils Relative % 60 43 - 77 %   Neutro Abs 4.9 1.7 - 7.7 K/uL   Lymphocytes Relative 26 12 - 46 %   Lymphs Abs 2.1 0.7 - 4.0 K/uL   Monocytes Relative 11 3 - 12 %   Monocytes Absolute 0.9 0.1 - 1.0 K/uL   Eosinophils Relative 3 0 - 5 %   Eosinophils Absolute 0.2 0.0 - 0.7 K/uL   Basophils Relative 0 0 - 1 %   Basophils Absolute 0.0 0.0 - 0.1 K/uL  Comprehensive metabolic panel     Status: Abnormal   Collection Time: 04/09/14  1:22 AM  Result Value Ref Range   Sodium 125 (L) 135 - 145 mmol/L    Comment: Please note change in reference range.   Potassium 4.0 3.5 - 5.1 mmol/L    Comment: Please note change in reference range.   Chloride 92 (L) 96 - 112 mEq/L   CO2 22 19 - 32 mmol/L   Glucose, Bld 109 (H) 70 - 99 mg/dL   BUN 7 6 - 23 mg/dL   Creatinine, Ser 0.67 0.50 - 1.10 mg/dL   Calcium 9.4 8.4 - 10.5 mg/dL   Total Protein 7.8 6.0 - 8.3 g/dL   Albumin 4.5 3.5 - 5.2 g/dL   AST 33 0 - 37 U/L   ALT 23 0 - 35 U/L   Alkaline Phosphatase 111 39 - 117 U/L   Total Bilirubin 0.5 0.3 - 1.2 mg/dL   GFR calc non Af Amer >90 >90 mL/min   GFR calc Af Amer >90 >90 mL/min    Comment: (NOTE) The eGFR has been calculated using  the CKD EPI equation.  This calculation has not been validated in all clinical situations. eGFR's persistently <90 mL/min signify possible Chronic Kidney Disease.    Anion gap 11 5 - 15  Ethanol     Status: None   Collection Time: 04/09/14  1:22 AM  Result Value Ref Range   Alcohol, Ethyl (B) <5 0 - 9 mg/dL    Comment:        LOWEST DETECTABLE LIMIT FOR SERUM ALCOHOL IS 11 mg/dL FOR MEDICAL PURPOSES ONLY   Acetaminophen level     Status: Abnormal   Collection Time: 04/09/14  4:33 PM  Result Value Ref Range   Acetaminophen (Tylenol), Serum <10.0 (L) 10 - 30 ug/mL    Comment:        THERAPEUTIC CONCENTRATIONS VARY SIGNIFICANTLY. A RANGE OF 10-30 ug/mL MAY BE AN EFFECTIVE CONCENTRATION FOR MANY PATIENTS. HOWEVER, SOME ARE BEST TREATED AT CONCENTRATIONS OUTSIDE THIS RANGE. ACETAMINOPHEN CONCENTRATIONS >150 ug/mL AT 4 HOURS AFTER INGESTION AND >50 ug/mL AT 12 HOURS AFTER INGESTION ARE OFTEN ASSOCIATED WITH TOXIC REACTIONS.   CBC     Status: Abnormal   Collection Time: 04/09/14  4:33 PM  Result Value Ref Range   WBC 5.5 4.0 - 10.5 K/uL   RBC 3.85 (L) 3.87 - 5.11 MIL/uL   Hemoglobin 12.5 12.0 - 15.0 g/dL   HCT 35.3 (L) 36.0 - 46.0 %   MCV 91.7 78.0 - 100.0 fL   MCH 32.5 26.0 - 34.0 pg   MCHC 35.4 30.0 - 36.0 g/dL   RDW 13.2 11.5 - 15.5 %   Platelets 353 150 - 400 K/uL  Comprehensive metabolic panel     Status: Abnormal   Collection Time: 04/09/14  4:33 PM  Result Value Ref Range   Sodium 125 (L) 135 - 145 mmol/L    Comment: Please note change in reference range.   Potassium 3.7 3.5 - 5.1 mmol/L    Comment: Please note change in reference range.   Chloride 90 (L) 96 - 112 mEq/L   CO2 26 19 - 32 mmol/L   Glucose, Bld 105 (H) 70 - 99 mg/dL   BUN 7 6 - 23 mg/dL   Creatinine, Ser 0.70 0.50 - 1.10 mg/dL   Calcium 9.3 8.4 - 10.5 mg/dL   Total Protein 7.6 6.0 - 8.3 g/dL   Albumin 4.6 3.5 - 5.2 g/dL   AST 27 0 - 37 U/L   ALT 23 0 - 35 U/L   Alkaline Phosphatase 118 (H)  39 - 117 U/L   Total Bilirubin 0.4 0.3 - 1.2 mg/dL   GFR calc non Af Amer >90 >90 mL/min   GFR calc Af Amer >90 >90 mL/min    Comment: (NOTE) The eGFR has been calculated using the CKD EPI equation. This calculation has not been validated in all clinical situations. eGFR's persistently <90 mL/min signify possible Chronic Kidney Disease.    Anion gap 9 5 - 15  Ethanol (ETOH)     Status: None   Collection Time: 04/09/14  4:33 PM  Result Value Ref Range   Alcohol, Ethyl (B) <5 0 - 9 mg/dL    Comment:        LOWEST DETECTABLE LIMIT FOR SERUM ALCOHOL IS 11 mg/dL FOR MEDICAL PURPOSES ONLY   Salicylate level     Status: None   Collection Time: 04/09/14  4:33 PM  Result Value Ref Range   Salicylate Lvl <1.5 2.8 - 20.0 mg/dL  Urine Drug Screen     Status: Abnormal  Collection Time: 04/09/14  4:38 PM  Result Value Ref Range   Opiates NONE DETECTED NONE DETECTED   Cocaine POSITIVE (A) NONE DETECTED   Benzodiazepines POSITIVE (A) NONE DETECTED   Amphetamines NONE DETECTED NONE DETECTED   Tetrahydrocannabinol POSITIVE (A) NONE DETECTED   Barbiturates NONE DETECTED NONE DETECTED    Comment:        DRUG SCREEN FOR MEDICAL PURPOSES ONLY.  IF CONFIRMATION IS NEEDED FOR ANY PURPOSE, NOTIFY LAB WITHIN 5 DAYS.        LOWEST DETECTABLE LIMITS FOR URINE DRUG SCREEN Drug Class       Cutoff (ng/mL) Amphetamine      1000 Barbiturate      200 Benzodiazepine   354 Tricyclics       562 Opiates          300 Cocaine          300 THC              50        Musculoskeletal: Strength & Muscle Tone: within normal limits Gait & Station: normal Patient leans: no leaning  Mental Status Examination;   Psychiatric Specialty Exam: Physical Exam  Constitutional: She appears well-developed and well-nourished.  HENT:  Head: Normocephalic.  Skin: She is not diaphoretic.    Review of Systems  Constitutional: Negative.   Gastrointestinal: Negative for nausea.  Musculoskeletal: Negative for  neck pain.  Skin: Positive for rash.  Neurological: Negative for tremors.  Psychiatric/Behavioral: Positive for depression. The patient is nervous/anxious.     Blood pressure 140/80, pulse 88, height 5' 2"  (1.575 m), weight 134 lb (60.782 kg).Body mass index is 24.5 kg/(m^2).  General Appearance: Casual  Eye Contact::  Fair  Speech:  Slow  Volume:  Normal  Mood:  Irritable  Affect:  Congruent  Thought Process:  Coherent  Orientation:  Full (Time, Place, and Person)  Thought Content:  Hallucinations: self talk or subconscious self talks and Paranoid Ideation  Suicidal Thoughts:  No  Homicidal Thoughts:  No  Memory:  Immediate;   Fair Recent;   Fair  Judgement:  Poor  Insight:  Shallow  Psychomotor Activity:  Normal  Concentration:  Fair  Recall:  Fair  Akathisia:  Negative  Handed:  Right  AIMS (if indicated):     Assets:  Desire for Improvement Leisure Time Social Support Vocational/Educational  Sleep:        Assessment: Axis I: History of Bipolar, Possible bipolar I, mixed episode. R/o Schizoaffective disorder, bipolar type. Substance induced mood disorder. Alcohol use disorder, moderate in early remission  Axis II: defferred   Axis III:  Past Medical History  Diagnosis Date  . Bipolar disorder   . Anxiety   . Depression   . Auditory hallucination   . Hallucination, visual   . Psychiatric hospitalization   . Tubal ectopic pregnancy   . GERD (gastroesophageal reflux disease)   . Hepatitis C carrier   . Herpes simplex virus infection     history of  . H/O: eczema   . Anxiety   . Alcohol abuse   . Tobacco abuse   . Schizophrenia     Axis IV: psychosocial. Loss of family members. financial   Treatment Plan and Summary:  She is concerned about the Depakote and weight gain says that she would not like to continue her cautioned not to do abrupt changes: She is being on so many different medications and she has agreed to cut down to 5 mg  to 1000 mg.  In  regarding her voices and paranoia she needs to be on a mood stabilizer or an antipsychotic she does not want to be on Zyprexa or Risperdal she's tried them before because of weight gain and concerns she does stopped it. I will startle a 2-40 mg if needed we can increase it to 80 mg next visit.  Change Wellbutrin to 1 tablet of 451. Benadryl 25 mg twice a day for anxiety. I cautioned not to start her on Ativan or any stimulant medication considering her paranoia considering history of drug use and alcohol she reluctantly accepted in the beginning but then she agrees with plan. I strongly recommend that she does therapy considering psychosocial issues in the past and multiple admissions with poor coping skills and possible noncompliance with the medications.  Pertinent Labs and Relevant Prior Notes reviewed. Medication Side effects, benefits and risks reviewed/discussed with Patient. Time given for patient to respond and asks questions regarding the Diagnosis and Medications. Safety concerns and to report to ER if suicidal or call 911. Relevant Medications refilled or called in to pharmacy. Discussed weight maintenance and Sleep Hygiene. Follow up with Primary care provider in regards to Medical conditions. Recommend compliance with medications and follow up office appointments. Discussed to avail opportunity to consider or/and continue Individual therapy with Counselor. Greater than 50% of time was spend in counseling and coordination of care with the patient.  Schedule for Follow up visit in 3 weeks or call in earlier as necessary.   Merian Capron, MD 05/16/2014

## 2014-05-19 ENCOUNTER — Telehealth (HOSPITAL_COMMUNITY): Payer: Self-pay | Admitting: *Deleted

## 2014-05-19 NOTE — Telephone Encounter (Signed)
[facility] for Latuda 40[facility] . Called: [phone] for prior authorization Per prior authorization dept, request will be sent to pharmacy for review. Response will be given with 72 hours. Reference #: [id number] Called and informed pt of prior authorization status.

## 2014-05-20 ENCOUNTER — Telehealth (HOSPITAL_COMMUNITY): Payer: Self-pay | Admitting: *Deleted

## 2014-05-20 NOTE — Telephone Encounter (Signed)
Received fax from [facility] for prior authorization for Latuda 40mg . PA approval code: [id number] Effective from 05/19/14- 05/19/16 Pharmacy was sent PA status and will notify pt.

## 2014-06-10 ENCOUNTER — Other Ambulatory Visit (HOSPITAL_COMMUNITY): Payer: Self-pay

## 2014-06-10 ENCOUNTER — Ambulatory Visit (HOSPITAL_COMMUNITY): Payer: Self-pay | Admitting: Psychiatry

## 2014-06-10 MED ORDER — BUPROPION HCL ER (XL) 450 MG PO TB24: 450.0000 mg | ORAL_TABLET | Freq: Every day | ORAL | Status: DC

## 2014-06-10 NOTE — Telephone Encounter (Signed)
Spoke w/ Morrie SheldonAshley from [facility]. Per Morrie SheldonAshley, pt pick up prescription for Latuda 40mg  on 2/2.  Rx for Wellbutrin 450mg  has not been filled since 11/15. Called and informed pt, Rx refill for Latuda was denied because it was to early to filled.  Per Dr. Gilmore LarocheAkhtar, pt is authorized for a 7 day supply for Wellbutrin 450mg , Qty 7. Pt verbally states and shows understanding.

## 2014-06-10 NOTE — Telephone Encounter (Signed)
PT needs a refill on latuda and wellbutrin sent to pharmacy [facility] [phone] she has an appt on March 1st please give her enough medication to get through that time.

## 2014-06-17 ENCOUNTER — Encounter (HOSPITAL_COMMUNITY): Payer: Self-pay | Admitting: Psychiatry

## 2014-06-17 ENCOUNTER — Encounter (INDEPENDENT_AMBULATORY_CARE_PROVIDER_SITE_OTHER): Payer: Self-pay

## 2014-06-17 ENCOUNTER — Ambulatory Visit (INDEPENDENT_AMBULATORY_CARE_PROVIDER_SITE_OTHER): Payer: Medicare Other | Admitting: Psychiatry

## 2014-06-17 VITALS — BP 120/60 | HR 80 | Ht 62.0 in | Wt 142.0 lb

## 2014-06-17 DIAGNOSIS — F1099 Alcohol use, unspecified with unspecified alcohol-induced disorder: Secondary | ICD-10-CM

## 2014-06-17 DIAGNOSIS — F250 Schizoaffective disorder, bipolar type: Secondary | ICD-10-CM | POA: Diagnosis not present

## 2014-06-17 DIAGNOSIS — F419 Anxiety disorder, unspecified: Secondary | ICD-10-CM | POA: Diagnosis not present

## 2014-06-17 DIAGNOSIS — F1994 Other psychoactive substance use, unspecified with psychoactive substance-induced mood disorder: Secondary | ICD-10-CM

## 2014-06-17 MED ORDER — LURASIDONE HCL 40 MG PO TABS: 40.0000 mg | ORAL_TABLET | Freq: Two times a day (BID) | ORAL | Status: DC

## 2014-06-17 MED ORDER — DIPHENHYDRAMINE HCL 25 MG PO TABS: 50.0000 mg | ORAL_TABLET | Freq: Every evening | ORAL | Status: DC | PRN

## 2014-06-17 MED ORDER — BUPROPION HCL ER (SR) 150 MG PO TB12: 150.0000 mg | ORAL_TABLET | Freq: Three times a day (TID) | ORAL | Status: AC

## 2014-06-17 NOTE — Progress Notes (Signed)
Patient ID: Tiffany [name], female   DOB: 1965-05-29, 49 y.o.   MRN: [id number]  [facility] Follow up outpatient visit  Tiffany [name] [id number] 49 y.o.  06/17/2014 2:04 PM  Chief Complaint:  Depression   History of Present Illness:   Patient Presents for follow up and medication management for Bipolar disorder, mixed. Substance induced mood disorder. Alcohol use disorder, mild. Initially has presented with symptoms of depression. SHe is a 49 years old currently single Caucasian female who is living with her friend. She has had long history of psychiatric condition and being diagnosed with bipolar possible schizoaffective disorder she has been with [facility] for 5 years but was not able to pay for the no-show appointments.  She has been on multiple medications in the past and hospital discharge. She usually has a frequency of hospitalization of the 2 times a year. Last hospital discharge she has been on Depakote but was concerned about weight cancer we have slowly tapered it down and she is not on Depakote anymore. Last visit was added at [place] 40 mg that has helped the symptoms of moodiness. She is taking Benadryl for anxiety. Wellbutrin for depression. Cyst she may plan to move to [facility] and also needs a letter for housing. She wanted to get on some medication for ADD I cautioned considering her history of using alcohol. Also consider she has bipolar that a stimulant medication would not be a good choice. She does believe the 2 does helped the moodiness and we will plan to increase that medication.  She was also hearing voices subconsciously and reported talks about the loved ones including her brother and mother is that she has lost in the past but she endorsed less paranoia and is not hearing their voices.  She has been on different medications that and believes that she needs to be on some medications for focusing and anxiety she has been on Ativan in the past from  her primary care doctor. I cautioned that considering her condition of bipolar that needs to be addressed and also her history of using alcohol and using drugs he would be cautious not to give her anything that would be habit-forming or have any addiction potential. She agreed with the plan and continued the interview.  Severity of depression as 6 out of 10. 10 being no depression     Past Psychiatric History/Hospitalization(s) Multiple with depression, suicidal toughts and drug/alcohol use with mood swings.  Hospitalization for psychiatric illness: Yes History of Electroconvulsive Shock Therapy: No Prior Suicide Attempts: suicidal toughts with depression.  Medical History; Past Medical History  Diagnosis Date  . Bipolar disorder   . Anxiety   . Depression   . Auditory hallucination   . Hallucination, visual   . Psychiatric hospitalization   . Tubal ectopic pregnancy   . GERD (gastroesophageal reflux disease)   . Hepatitis C carrier   . Herpes simplex virus infection     history of  . H/O: eczema   . Anxiety   . Alcohol abuse   . Tobacco abuse   . Schizophrenia     Allergies: Allergies  Allergen Reactions  . Sulfa Antibiotics Nausea And Vomiting  . Hydrocodone Other (See Comments)     this upsets stomach.    Medications: Outpatient Encounter Prescriptions as of 06/17/2014  Medication Sig  . BuPROPion HCl ER, XL, 450 MG TB24 Take 450 mg by mouth daily.  . carbamazepine (TEGRETOL) 200 MG tablet Take 200 mg by  mouth 2 (two) times daily.  . diphenhydrAMINE (BENADRYL) 25 MG tablet Take 2 tablets (50 mg total) by mouth at bedtime as needed for itching.  . lurasidone (LATUDA) 40 MG TABS tablet Take 1 tablet (40 mg total) by mouth 2 (two) times daily.  . naproxen (NAPROSYN) 500 MG tablet Take 500 mg by mouth 2 (two) times daily with a meal.  . omeprazole (PRILOSEC) 40 MG capsule Take 40 mg by mouth daily.  . traZODone (DESYREL) 150 MG tablet Take 150 mg by mouth at bedtime.   . [DISCONTINUED] diphenhydrAMINE (BENADRYL) 25 MG tablet Take 2 tablets (50 mg total) by mouth at bedtime as needed for itching.  . [DISCONTINUED] lurasidone (LATUDA) 40 MG TABS tablet Take 1 tablet (40 mg total) by mouth daily with breakfast.  . buPROPion (WELLBUTRIN SR) 150 MG 12 hr tablet Take 1 tablet (150 mg total) by mouth 3 (three) times daily.  . [DISCONTINUED] divalproex (DEPAKOTE) 500 MG DR tablet Take 1 tablet (500 mg total) by mouth 2 (two) times daily. (Patient not taking: Reported on 06/17/2014)  . [DISCONTINUED] ziprasidone (GEODON) injection Inject 20 mg into the muscle once.     Substance Abuse History: Denies regular use of marijuana. Says she was given by that person when she was homeless. Denies using cocaine either but was positive prior last admission. Admits to alcohol upto 5 to 7 beer a times when drinks. Last use was in December   Family History; Family History  Problem Relation Age of Onset  . Hypertension    . COPD Father   . Alcohol abuse Father   . Anxiety disorder Mother       Labs:  Recent Results (from the past 2160 hour(s))  CBC with Differential     Status: None   Collection Time: 03/28/14 11:42 AM  Result Value Ref Range   WBC 5.0 4.0 - 10.5 K/uL   RBC 4.28 3.87 - 5.11 MIL/uL   Hemoglobin 13.8 12.0 - 15.0 g/dL   HCT 39.5 36.0 - 46.0 %   MCV 92.3 78.0 - 100.0 fL   MCH 32.2 26.0 - 34.0 pg   MCHC 34.9 30.0 - 36.0 g/dL   RDW 13.6 11.5 - 15.5 %   Platelets 245 150 - 400 K/uL   Neutrophils Relative % 60 43 - 77 %   Neutro Abs 3.0 1.7 - 7.7 K/uL   Lymphocytes Relative 28 12 - 46 %   Lymphs Abs 1.4 0.7 - 4.0 K/uL   Monocytes Relative 10 3 - 12 %   Monocytes Absolute 0.5 0.1 - 1.0 K/uL   Eosinophils Relative 2 0 - 5 %   Eosinophils Absolute 0.1 0.0 - 0.7 K/uL   Basophils Relative 0 0 - 1 %   Basophils Absolute 0.0 0.0 - 0.1 K/uL  Comprehensive metabolic panel     Status: Abnormal   Collection Time: 03/28/14 11:42 AM  Result Value Ref Range    Sodium 129 (L) 137 - 147 mEq/L   Potassium 3.9 3.7 - 5.3 mEq/L   Chloride 88 (L) 96 - 112 mEq/L   CO2 23 19 - 32 mEq/L   Glucose, Bld 111 (H) 70 - 99 mg/dL   BUN 3 (L) 6 - 23 mg/dL   Creatinine, Ser 0.58 0.50 - 1.10 mg/dL   Calcium 10.1 8.4 - 10.5 mg/dL   Total Protein 8.4 (H) 6.0 - 8.3 g/dL   Albumin 4.9 3.5 - 5.2 g/dL   AST 27 0 - 37 U/L  ALT 18 0 - 35 U/L   Alkaline Phosphatase 129 (H) 39 - 117 U/L   Total Bilirubin 0.2 (L) 0.3 - 1.2 mg/dL   GFR calc non Af Amer >90 >90 mL/min   GFR calc Af Amer >90 >90 mL/min    Comment: (NOTE) The eGFR has been calculated using the CKD EPI equation. This calculation has not been validated in all clinical situations. eGFR's persistently <90 mL/min signify possible Chronic Kidney Disease.    Anion gap 18 (H) 5 - 15  Ethanol     Status: None   Collection Time: 03/28/14 11:42 AM  Result Value Ref Range   Alcohol, Ethyl (B) <11 0 - 11 mg/dL    Comment:        LOWEST DETECTABLE LIMIT FOR SERUM ALCOHOL IS 11 mg/dL FOR MEDICAL PURPOSES ONLY   Salicylate level     Status: Abnormal   Collection Time: 03/28/14 11:42 AM  Result Value Ref Range   Salicylate Lvl <5.6 (L) 2.8 - 20.0 mg/dL  Acetaminophen level     Status: None   Collection Time: 03/28/14 11:42 AM  Result Value Ref Range   Acetaminophen (Tylenol), Serum <15.0 10 - 30 ug/mL    Comment:        THERAPEUTIC CONCENTRATIONS VARY SIGNIFICANTLY. A RANGE OF 10-30 ug/mL MAY BE AN EFFECTIVE CONCENTRATION FOR MANY PATIENTS. HOWEVER, SOME ARE BEST TREATED AT CONCENTRATIONS OUTSIDE THIS RANGE. ACETAMINOPHEN CONCENTRATIONS >150 ug/mL AT 4 HOURS AFTER INGESTION AND >50 ug/mL AT 12 HOURS AFTER INGESTION ARE OFTEN ASSOCIATED WITH TOXIC REACTIONS.   Carbamazepine level, total     Status: None   Collection Time: 03/28/14 11:42 AM  Result Value Ref Range   Carbamazepine Lvl 7.5 4.0 - 12.0 ug/mL    Comment: Performed at [facility] metabolic panel     Status: Abnormal    Collection Time: 03/28/14  5:58 PM  Result Value Ref Range   Sodium 129 (L) 137 - 147 mEq/L   Potassium 4.2 3.7 - 5.3 mEq/L   Chloride 92 (L) 96 - 112 mEq/L   CO2 20 19 - 32 mEq/L   Glucose, Bld 130 (H) 70 - 99 mg/dL   BUN 4 (L) 6 - 23 mg/dL   Creatinine, Ser 0.57 0.50 - 1.10 mg/dL   Calcium 8.7 8.4 - 10.5 mg/dL   GFR calc non Af Amer >90 >90 mL/min   GFR calc Af Amer >90 >90 mL/min    Comment: (NOTE) The eGFR has been calculated using the CKD EPI equation. This calculation has not been validated in all clinical situations. eGFR's persistently <90 mL/min signify possible Chronic Kidney Disease.    Anion gap 17 (H) 5 - 15  Drug screen panel, emergency     Status: None   Collection Time: 03/28/14  8:18 PM  Result Value Ref Range   Opiates NONE DETECTED NONE DETECTED   Cocaine NONE DETECTED NONE DETECTED   Benzodiazepines NONE DETECTED NONE DETECTED   Amphetamines NONE DETECTED NONE DETECTED   Tetrahydrocannabinol NONE DETECTED NONE DETECTED   Barbiturates NONE DETECTED NONE DETECTED    Comment:        DRUG SCREEN FOR MEDICAL PURPOSES ONLY.  IF CONFIRMATION IS NEEDED FOR ANY PURPOSE, NOTIFY LAB WITHIN 5 DAYS.        LOWEST DETECTABLE LIMITS FOR URINE DRUG SCREEN Drug Class       Cutoff (ng/mL) Amphetamine      1000 Barbiturate      200 Benzodiazepine  774 Tricyclics       128 Opiates          300 Cocaine          300 THC              50   Comprehensive metabolic panel     Status: Abnormal   Collection Time: 03/29/14  8:53 PM  Result Value Ref Range   Sodium 132 (L) 137 - 147 mEq/L   Potassium 4.2 3.7 - 5.3 mEq/L   Chloride 94 (L) 96 - 112 mEq/L   CO2 23 19 - 32 mEq/L   Glucose, Bld 123 (H) 70 - 99 mg/dL   BUN 5 (L) 6 - 23 mg/dL   Creatinine, Ser 0.59 0.50 - 1.10 mg/dL   Calcium 9.6 8.4 - 10.5 mg/dL   Total Protein 7.5 6.0 - 8.3 g/dL   Albumin 4.1 3.5 - 5.2 g/dL   AST 27 0 - 37 U/L   ALT 19 0 - 35 U/L   Alkaline Phosphatase 110 39 - 117 U/L   Total Bilirubin  0.2 (L) 0.3 - 1.2 mg/dL   GFR calc non Af Amer >90 >90 mL/min   GFR calc Af Amer >90 >90 mL/min    Comment: (NOTE) The eGFR has been calculated using the CKD EPI equation. This calculation has not been validated in all clinical situations. eGFR's persistently <90 mL/min signify possible Chronic Kidney Disease.    Anion gap 15 5 - 15  Urine Drug Screen     Status: None   Collection Time: 04/01/14  2:19 PM  Result Value Ref Range   Opiates NONE DETECTED NONE DETECTED   Cocaine NONE DETECTED NONE DETECTED   Benzodiazepines NONE DETECTED NONE DETECTED   Amphetamines NONE DETECTED NONE DETECTED   Tetrahydrocannabinol NONE DETECTED NONE DETECTED   Barbiturates NONE DETECTED NONE DETECTED    Comment:        DRUG SCREEN FOR MEDICAL PURPOSES ONLY.  IF CONFIRMATION IS NEEDED FOR ANY PURPOSE, NOTIFY LAB WITHIN 5 DAYS.        LOWEST DETECTABLE LIMITS FOR URINE DRUG SCREEN Drug Class       Cutoff (ng/mL) Amphetamine      1000 Barbiturate      200 Benzodiazepine   786 Tricyclics       767 Opiates          300 Cocaine          300 THC              50   Acetaminophen level     Status: None   Collection Time: 04/01/14  2:38 PM  Result Value Ref Range   Acetaminophen (Tylenol), Serum <15.0 10 - 30 ug/mL    Comment:        THERAPEUTIC CONCENTRATIONS VARY SIGNIFICANTLY. A RANGE OF 10-30 ug/mL MAY BE AN EFFECTIVE CONCENTRATION FOR MANY PATIENTS. HOWEVER, SOME ARE BEST TREATED AT CONCENTRATIONS OUTSIDE THIS RANGE. ACETAMINOPHEN CONCENTRATIONS >150 ug/mL AT 4 HOURS AFTER INGESTION AND >50 ug/mL AT 12 HOURS AFTER INGESTION ARE OFTEN ASSOCIATED WITH TOXIC REACTIONS.   CBC     Status: Abnormal   Collection Time: 04/01/14  2:38 PM  Result Value Ref Range   WBC 12.3 (H) 4.0 - 10.5 K/uL   RBC 4.27 3.87 - 5.11 MIL/uL   Hemoglobin 13.9 12.0 - 15.0 g/dL   HCT 39.7 36.0 - 46.0 %   MCV 93.0 78.0 - 100.0 fL   MCH 32.6 26.0 -  34.0 pg   MCHC 35.0 30.0 - 36.0 g/dL   RDW 13.7 11.5 - 15.5 %    Platelets 259 150 - 400 K/uL  Comprehensive metabolic panel     Status: Abnormal   Collection Time: 04/01/14  2:38 PM  Result Value Ref Range   Sodium 129 (L) 137 - 147 mEq/L   Potassium 4.2 3.7 - 5.3 mEq/L   Chloride 89 (L) 96 - 112 mEq/L   CO2 23 19 - 32 mEq/L   Glucose, Bld 110 (H) 70 - 99 mg/dL   BUN 5 (L) 6 - 23 mg/dL   Creatinine, Ser 0.63 0.50 - 1.10 mg/dL   Calcium 10.3 8.4 - 10.5 mg/dL   Total Protein 8.4 (H) 6.0 - 8.3 g/dL   Albumin 4.8 3.5 - 5.2 g/dL   AST 28 0 - 37 U/L   ALT 18 0 - 35 U/L   Alkaline Phosphatase 126 (H) 39 - 117 U/L   Total Bilirubin 0.2 (L) 0.3 - 1.2 mg/dL   GFR calc non Af Amer >90 >90 mL/min   GFR calc Af Amer >90 >90 mL/min    Comment: (NOTE) The eGFR has been calculated using the CKD EPI equation. This calculation has not been validated in all clinical situations. eGFR's persistently <90 mL/min signify possible Chronic Kidney Disease.    Anion gap 17 (H) 5 - 15  Ethanol (ETOH)     Status: None   Collection Time: 04/01/14  2:38 PM  Result Value Ref Range   Alcohol, Ethyl (B) <11 0 - 11 mg/dL    Comment:        LOWEST DETECTABLE LIMIT FOR SERUM ALCOHOL IS 11 mg/dL FOR MEDICAL PURPOSES ONLY   Salicylate level     Status: Abnormal   Collection Time: 04/01/14  2:38 PM  Result Value Ref Range   Salicylate Lvl <4.1 (L) 2.8 - 20.0 mg/dL  CBC with Differential     Status: Abnormal   Collection Time: 04/03/14  1:09 AM  Result Value Ref Range   WBC 8.6 4.0 - 10.5 K/uL   RBC 4.14 3.87 - 5.11 MIL/uL   Hemoglobin 13.1 12.0 - 15.0 g/dL   HCT 37.0 36.0 - 46.0 %   MCV 89.4 78.0 - 100.0 fL   MCH 31.6 26.0 - 34.0 pg   MCHC 35.4 30.0 - 36.0 g/dL   RDW 13.2 11.5 - 15.5 %   Platelets 244 150 - 400 K/uL   Neutrophils Relative % 63 43 - 77 %   Neutro Abs 5.3 1.7 - 7.7 K/uL   Lymphocytes Relative 22 12 - 46 %   Lymphs Abs 1.9 0.7 - 4.0 K/uL   Monocytes Relative 13 (H) 3 - 12 %   Monocytes Absolute 1.1 (H) 0.1 - 1.0 K/uL   Eosinophils Relative 2 0 -  5 %   Eosinophils Absolute 0.2 0.0 - 0.7 K/uL   Basophils Relative 0 0 - 1 %   Basophils Absolute 0.0 0.0 - 0.1 K/uL  Comprehensive metabolic panel     Status: Abnormal   Collection Time: 04/03/14  1:09 AM  Result Value Ref Range   Sodium 123 (L) 137 - 147 mEq/L   Potassium 3.5 (L) 3.7 - 5.3 mEq/L   Chloride 85 (L) 96 - 112 mEq/L   CO2 21 19 - 32 mEq/L   Glucose, Bld 93 70 - 99 mg/dL   BUN 5 (L) 6 - 23 mg/dL   Creatinine, Ser 0.46 (L) 0.50 - 1.10  mg/dL   Calcium 9.5 8.4 - 10.5 mg/dL   Total Protein 7.7 6.0 - 8.3 g/dL   Albumin 4.2 3.5 - 5.2 g/dL   AST 26 0 - 37 U/L   ALT 18 0 - 35 U/L   Alkaline Phosphatase 127 (H) 39 - 117 U/L   Total Bilirubin 0.4 0.3 - 1.2 mg/dL   GFR calc non Af Amer >90 >90 mL/min   GFR calc Af Amer >90 >90 mL/min    Comment: (NOTE) The eGFR has been calculated using the CKD EPI equation. This calculation has not been validated in all clinical situations. eGFR's persistently <90 mL/min signify possible Chronic Kidney Disease.    Anion gap 17 (H) 5 - 15  Ethanol     Status: Abnormal   Collection Time: 04/03/14  1:09 AM  Result Value Ref Range   Alcohol, Ethyl (B) 16 (H) 0 - 11 mg/dL    Comment:        LOWEST DETECTABLE LIMIT FOR SERUM ALCOHOL IS 11 mg/dL FOR MEDICAL PURPOSES ONLY   Drug screen panel, emergency     Status: Abnormal   Collection Time: 04/03/14  2:35 AM  Result Value Ref Range   Opiates NONE DETECTED NONE DETECTED   Cocaine NONE DETECTED NONE DETECTED   Benzodiazepines POSITIVE (A) NONE DETECTED   Amphetamines NONE DETECTED NONE DETECTED   Tetrahydrocannabinol NONE DETECTED NONE DETECTED   Barbiturates NONE DETECTED NONE DETECTED    Comment:        DRUG SCREEN FOR MEDICAL PURPOSES ONLY.  IF CONFIRMATION IS NEEDED FOR ANY PURPOSE, NOTIFY LAB WITHIN 5 DAYS.        LOWEST DETECTABLE LIMITS FOR URINE DRUG SCREEN Drug Class       Cutoff (ng/mL) Amphetamine      1000 Barbiturate      200 Benzodiazepine   952 Tricyclics        841 Opiates          300 Cocaine          300 THC              50   Basic metabolic panel     Status: Abnormal   Collection Time: 04/03/14  4:48 AM  Result Value Ref Range   Sodium 131 (L) 137 - 147 mEq/L    Comment: DELTA CHECK NOTED   Potassium 3.8 3.7 - 5.3 mEq/L   Chloride 97 96 - 112 mEq/L    Comment: DELTA CHECK NOTED   CO2 21 19 - 32 mEq/L   Glucose, Bld 89 70 - 99 mg/dL   BUN 4 (L) 6 - 23 mg/dL   Creatinine, Ser 0.56 0.50 - 1.10 mg/dL   Calcium 8.2 (L) 8.4 - 10.5 mg/dL   GFR calc non Af Amer >90 >90 mL/min   GFR calc Af Amer >90 >90 mL/min    Comment: (NOTE) The eGFR has been calculated using the CKD EPI equation. This calculation has not been validated in all clinical situations. eGFR's persistently <90 mL/min signify possible Chronic Kidney Disease.    Anion gap 13 5 - 15  CBC WITH DIFFERENTIAL     Status: None   Collection Time: 04/09/14  1:22 AM  Result Value Ref Range   WBC 8.2 4.0 - 10.5 K/uL   RBC 4.10 3.87 - 5.11 MIL/uL   Hemoglobin 13.1 12.0 - 15.0 g/dL   HCT 37.2 36.0 - 46.0 %   MCV 90.7 78.0 - 100.0 fL   MCH  32.0 26.0 - 34.0 pg   MCHC 35.2 30.0 - 36.0 g/dL   RDW 13.2 11.5 - 15.5 %   Platelets 337 150 - 400 K/uL   Neutrophils Relative % 60 43 - 77 %   Neutro Abs 4.9 1.7 - 7.7 K/uL   Lymphocytes Relative 26 12 - 46 %   Lymphs Abs 2.1 0.7 - 4.0 K/uL   Monocytes Relative 11 3 - 12 %   Monocytes Absolute 0.9 0.1 - 1.0 K/uL   Eosinophils Relative 3 0 - 5 %   Eosinophils Absolute 0.2 0.0 - 0.7 K/uL   Basophils Relative 0 0 - 1 %   Basophils Absolute 0.0 0.0 - 0.1 K/uL  Comprehensive metabolic panel     Status: Abnormal   Collection Time: 04/09/14  1:22 AM  Result Value Ref Range   Sodium 125 (L) 135 - 145 mmol/L    Comment: Please note change in reference range.   Potassium 4.0 3.5 - 5.1 mmol/L    Comment: Please note change in reference range.   Chloride 92 (L) 96 - 112 mEq/L   CO2 22 19 - 32 mmol/L   Glucose, Bld 109 (H) 70 - 99 mg/dL   BUN 7 6  - 23 mg/dL   Creatinine, Ser 0.67 0.50 - 1.10 mg/dL   Calcium 9.4 8.4 - 10.5 mg/dL   Total Protein 7.8 6.0 - 8.3 g/dL   Albumin 4.5 3.5 - 5.2 g/dL   AST 33 0 - 37 U/L   ALT 23 0 - 35 U/L   Alkaline Phosphatase 111 39 - 117 U/L   Total Bilirubin 0.5 0.3 - 1.2 mg/dL   GFR calc non Af Amer >90 >90 mL/min   GFR calc Af Amer >90 >90 mL/min    Comment: (NOTE) The eGFR has been calculated using the CKD EPI equation. This calculation has not been validated in all clinical situations. eGFR's persistently <90 mL/min signify possible Chronic Kidney Disease.    Anion gap 11 5 - 15  Ethanol     Status: None   Collection Time: 04/09/14  1:22 AM  Result Value Ref Range   Alcohol, Ethyl (B) <5 0 - 9 mg/dL    Comment:        LOWEST DETECTABLE LIMIT FOR SERUM ALCOHOL IS 11 mg/dL FOR MEDICAL PURPOSES ONLY   Acetaminophen level     Status: Abnormal   Collection Time: 04/09/14  4:33 PM  Result Value Ref Range   Acetaminophen (Tylenol), Serum <10.0 (L) 10 - 30 ug/mL    Comment:        THERAPEUTIC CONCENTRATIONS VARY SIGNIFICANTLY. A RANGE OF 10-30 ug/mL MAY BE AN EFFECTIVE CONCENTRATION FOR MANY PATIENTS. HOWEVER, SOME ARE BEST TREATED AT CONCENTRATIONS OUTSIDE THIS RANGE. ACETAMINOPHEN CONCENTRATIONS >150 ug/mL AT 4 HOURS AFTER INGESTION AND >50 ug/mL AT 12 HOURS AFTER INGESTION ARE OFTEN ASSOCIATED WITH TOXIC REACTIONS.   CBC     Status: Abnormal   Collection Time: 04/09/14  4:33 PM  Result Value Ref Range   WBC 5.5 4.0 - 10.5 K/uL   RBC 3.85 (L) 3.87 - 5.11 MIL/uL   Hemoglobin 12.5 12.0 - 15.0 g/dL   HCT 35.3 (L) 36.0 - 46.0 %   MCV 91.7 78.0 - 100.0 fL   MCH 32.5 26.0 - 34.0 pg   MCHC 35.4 30.0 - 36.0 g/dL   RDW 13.2 11.5 - 15.5 %   Platelets 353 150 - 400 K/uL  Comprehensive metabolic panel  Status: Abnormal   Collection Time: 04/09/14  4:33 PM  Result Value Ref Range   Sodium 125 (L) 135 - 145 mmol/L    Comment: Please note change in reference range.   Potassium 3.7  3.5 - 5.1 mmol/L    Comment: Please note change in reference range.   Chloride 90 (L) 96 - 112 mEq/L   CO2 26 19 - 32 mmol/L   Glucose, Bld 105 (H) 70 - 99 mg/dL   BUN 7 6 - 23 mg/dL   Creatinine, Ser 0.70 0.50 - 1.10 mg/dL   Calcium 9.3 8.4 - 10.5 mg/dL   Total Protein 7.6 6.0 - 8.3 g/dL   Albumin 4.6 3.5 - 5.2 g/dL   AST 27 0 - 37 U/L   ALT 23 0 - 35 U/L   Alkaline Phosphatase 118 (H) 39 - 117 U/L   Total Bilirubin 0.4 0.3 - 1.2 mg/dL   GFR calc non Af Amer >90 >90 mL/min   GFR calc Af Amer >90 >90 mL/min    Comment: (NOTE) The eGFR has been calculated using the CKD EPI equation. This calculation has not been validated in all clinical situations. eGFR's persistently <90 mL/min signify possible Chronic Kidney Disease.    Anion gap 9 5 - 15  Ethanol (ETOH)     Status: None   Collection Time: 04/09/14  4:33 PM  Result Value Ref Range   Alcohol, Ethyl (B) <5 0 - 9 mg/dL    Comment:        LOWEST DETECTABLE LIMIT FOR SERUM ALCOHOL IS 11 mg/dL FOR MEDICAL PURPOSES ONLY   Salicylate level     Status: None   Collection Time: 04/09/14  4:33 PM  Result Value Ref Range   Salicylate Lvl <7.8 2.8 - 20.0 mg/dL  Urine Drug Screen     Status: Abnormal   Collection Time: 04/09/14  4:38 PM  Result Value Ref Range   Opiates NONE DETECTED NONE DETECTED   Cocaine POSITIVE (A) NONE DETECTED   Benzodiazepines POSITIVE (A) NONE DETECTED   Amphetamines NONE DETECTED NONE DETECTED   Tetrahydrocannabinol POSITIVE (A) NONE DETECTED   Barbiturates NONE DETECTED NONE DETECTED    Comment:        DRUG SCREEN FOR MEDICAL PURPOSES ONLY.  IF CONFIRMATION IS NEEDED FOR ANY PURPOSE, NOTIFY LAB WITHIN 5 DAYS.        LOWEST DETECTABLE LIMITS FOR URINE DRUG SCREEN Drug Class       Cutoff (ng/mL) Amphetamine      1000 Barbiturate      200 Benzodiazepine   588 Tricyclics       502 Opiates          300 Cocaine          300 THC              50        Musculoskeletal: Strength & Muscle Tone:  within normal limits Gait & Station: normal Patient leans: no leaning  Mental Status Examination;   Psychiatric Specialty Exam: Physical Exam  Constitutional: She appears well-developed and well-nourished.  HENT:  Head: Normocephalic.  Skin: She is not diaphoretic.    Review of Systems  Constitutional: Negative.   Cardiovascular: Negative for chest pain.  Skin: Negative for rash.  Neurological: Negative for tremors.  Psychiatric/Behavioral: Positive for depression.    Blood pressure 120/60, pulse 80, height 5' 2"  (1.575 m), weight 142 lb (64.411 kg).Body mass index is 25.97 kg/(m^2).  General Appearance: Casual  Eye Contact::  Fair  Speech:  Slow  Volume:  Normal  Mood:  Less labile and more euthymic  Affect:  Congruent  Thought Process:  Coherent  Orientation:  Full (Time, Place, and Person)  Thought Content:  Not hallucianting  Suicidal Thoughts:  No  Homicidal Thoughts:  No  Memory:  Immediate;   Fair Recent;   Fair  Judgement:  Poor  Insight:  Shallow  Psychomotor Activity:  Normal  Concentration:  Fair  Recall:  Fair  Akathisia:  Negative  Handed:  Right  AIMS (if indicated):     Assets:  Desire for Improvement Leisure Time Social Support Vocational/Educational  Sleep:        Assessment: Axis I: History of Bipolar, Possible bipolar I, mixed episode. R/o Schizoaffective disorder, bipolar type. Substance induced mood disorder. Alcohol use disorder, mild to moderate  Axis II: defferred   Axis III:  Past Medical History  Diagnosis Date  . Bipolar disorder   . Anxiety   . Depression   . Auditory hallucination   . Hallucination, visual   . Psychiatric hospitalization   . Tubal ectopic pregnancy   . GERD (gastroesophageal reflux disease)   . Hepatitis C carrier   . Herpes simplex virus infection     history of  . H/O: eczema   . Anxiety   . Alcohol abuse   . Tobacco abuse   . Schizophrenia     Axis IV: psychosocial. Loss of family members.  financial   Treatment Plan and Summary: Discontinue Depakote that she is already tapered off.  Wellbutrin will be changed to generic because she was having difficulty getting the XL form. 450 mg.  Continue Benadryl 25 mg twice a day for anxiety and stress.  Increase Latuda to 40 mg twice a day.  Pertinent Labs and Relevant Prior Notes reviewed. Medication Side effects, benefits and risks reviewed/discussed with Patient. Time given for patient to respond and asks questions regarding the Diagnosis and Medications. Safety concerns and to report to ER if suicidal or call 911. Relevant Medications refilled or called in to pharmacy. Discussed weight maintenance and Sleep Hygiene. Follow up with Primary care provider in regards to Medical conditions. Recommend compliance with medications and follow up office appointments. Discussed to avail opportunity to consider or/and continue Individual therapy with Counselor. Greater than 50% of time was spend in counseling and coordination of care with the patient.  Schedule for Follow up visit in 4 to 6 weeks or call in earlier as necessary.   Merian Capron, MD 06/17/2014

## 2014-06-25 ENCOUNTER — Ambulatory Visit (HOSPITAL_COMMUNITY): Payer: Self-pay | Admitting: Licensed Clinical Social Worker

## 2014-07-08 ENCOUNTER — Ambulatory Visit (HOSPITAL_COMMUNITY): Payer: Self-pay | Admitting: Licensed Clinical Social Worker

## 2014-09-05 ENCOUNTER — Ambulatory Visit (HOSPITAL_COMMUNITY): Payer: Self-pay | Admitting: Psychiatry

## 2014-09-11 ENCOUNTER — Ambulatory Visit (HOSPITAL_COMMUNITY): Payer: Self-pay | Admitting: Psychiatry

## 2015-02-03 IMAGING — US US ABDOMEN LIMITED
1 series · 14 of 25 positions shown · non-contrast
Comparison: Liver ultrasound from a liver biopsy, 12/11/2003

CLINICAL DATA: The patient with hepatitis C.  Evaluate for
hepatoma.

LIMITED ABDOMINAL ULTRASOUND - RIGHT UPPER QUADRANT

[Series 1: us abdomen limited · 0.20mm/px · 14 of 54 slices shown]
[im 1/54]
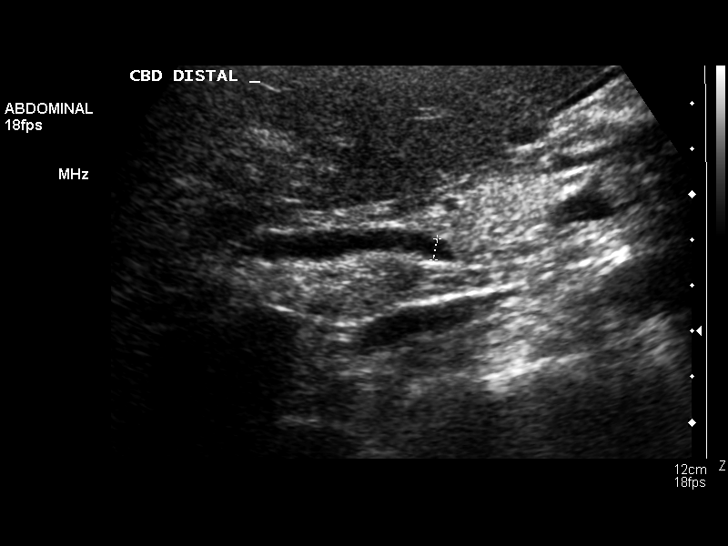
[im 5/54]
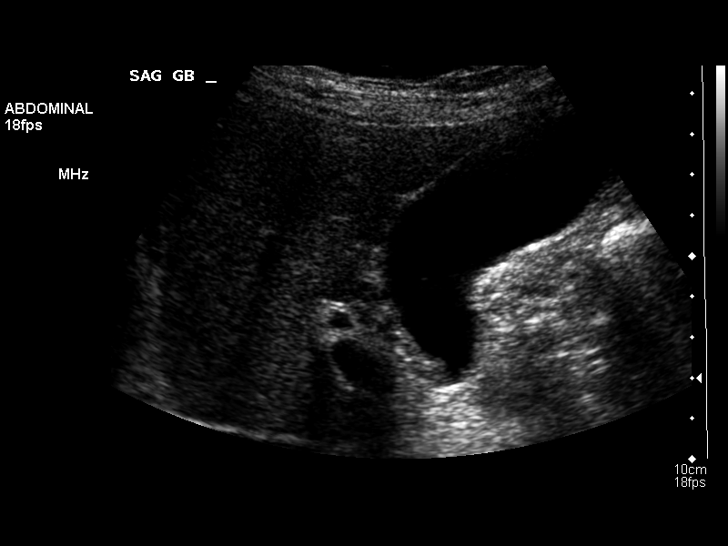
[im 9/54]
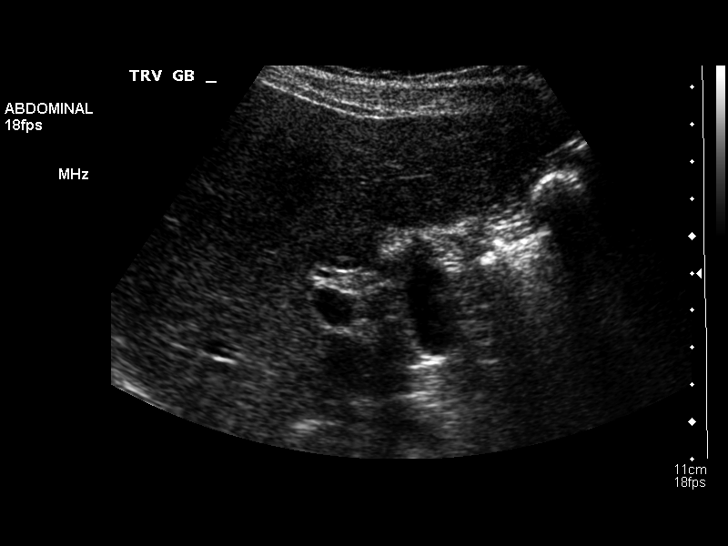
[im 14/54]
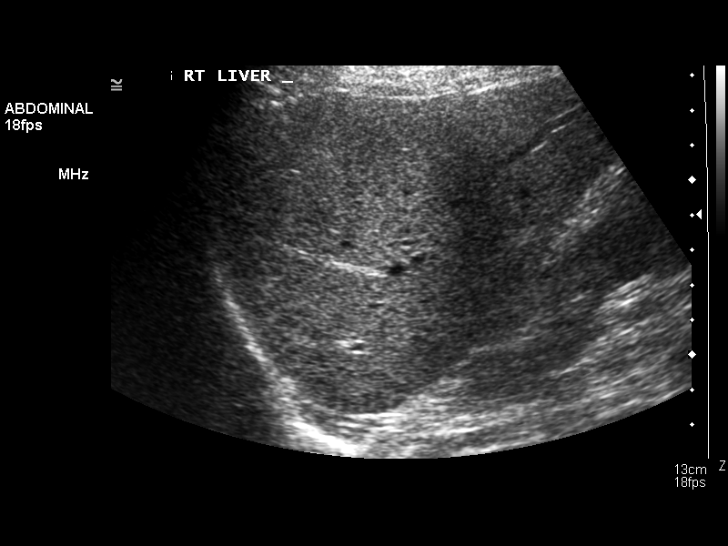
[im 18/54]
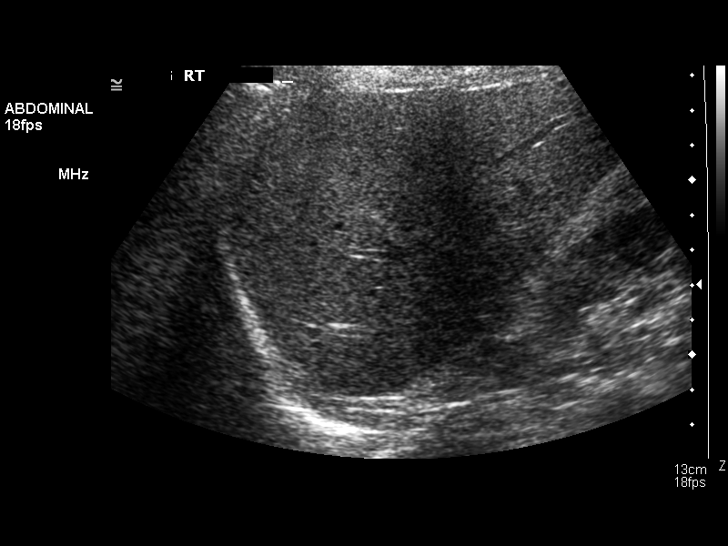
[im 20/54]
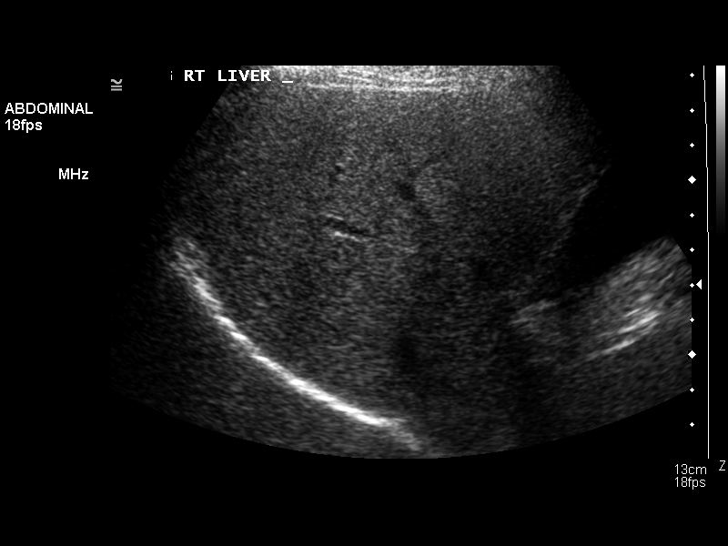
[im 25/54]
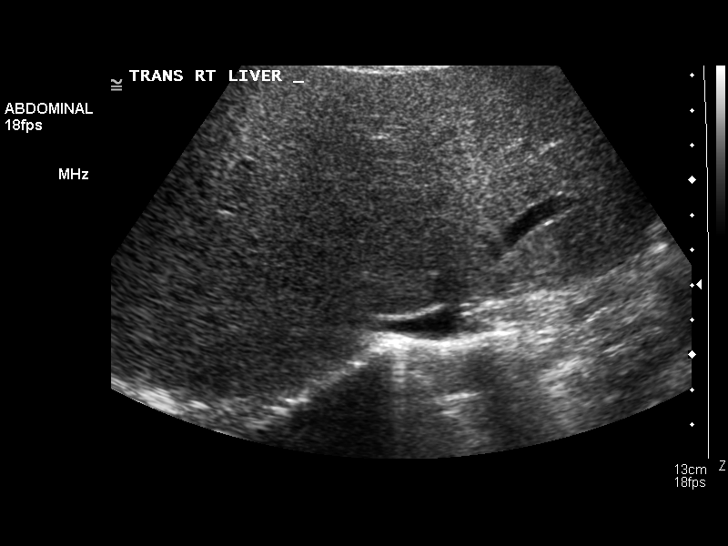
[im 29/54]
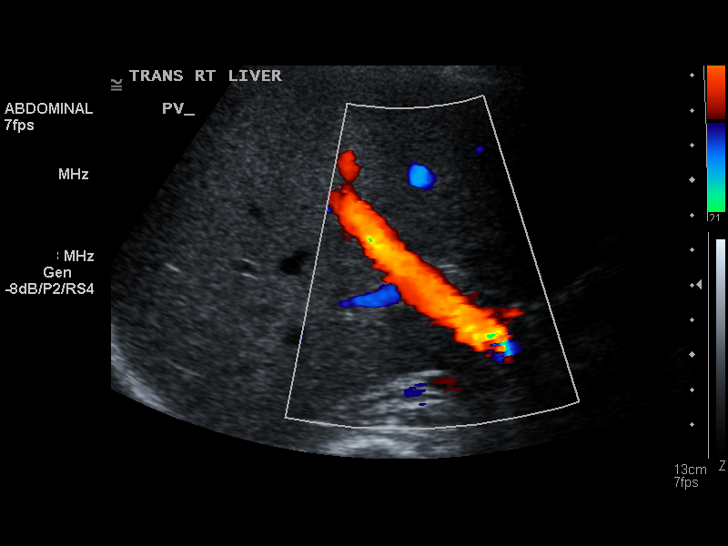
[im 34/54]
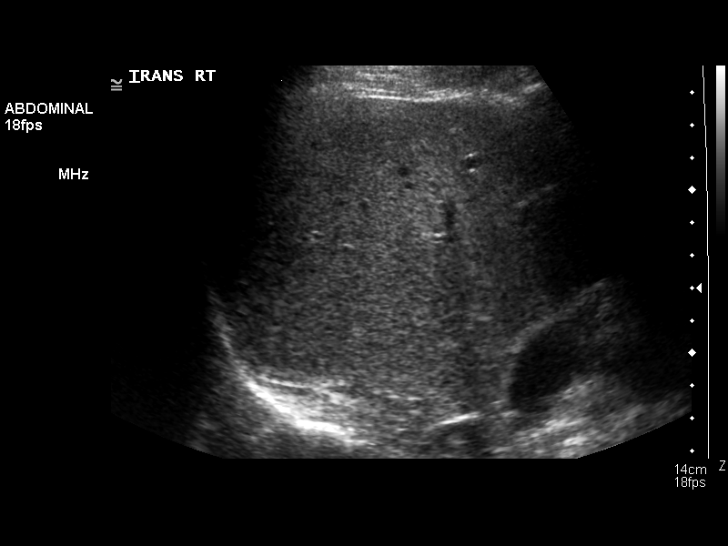
[im 36/54]
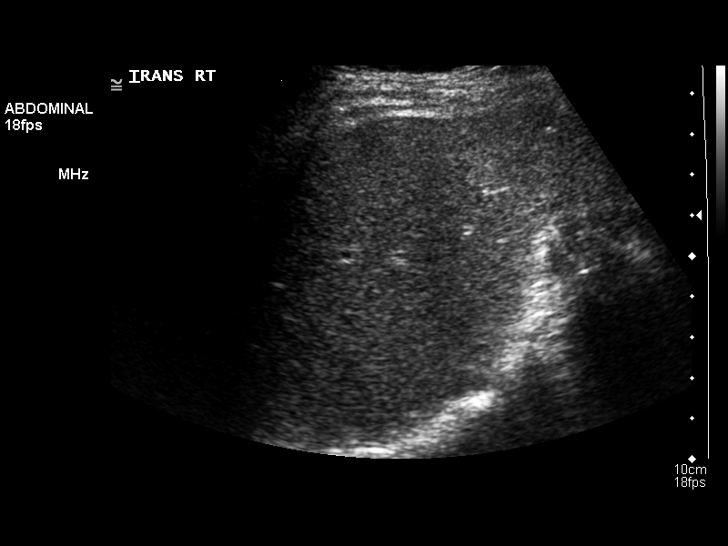
[im 40/54]
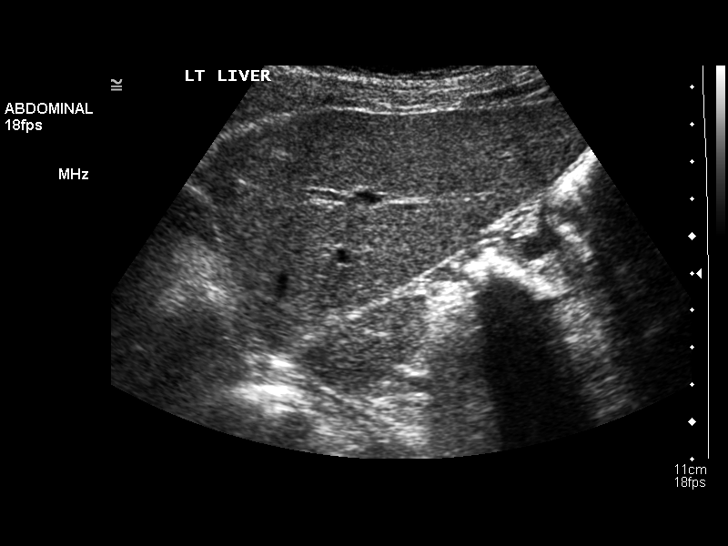
[im 45/54]
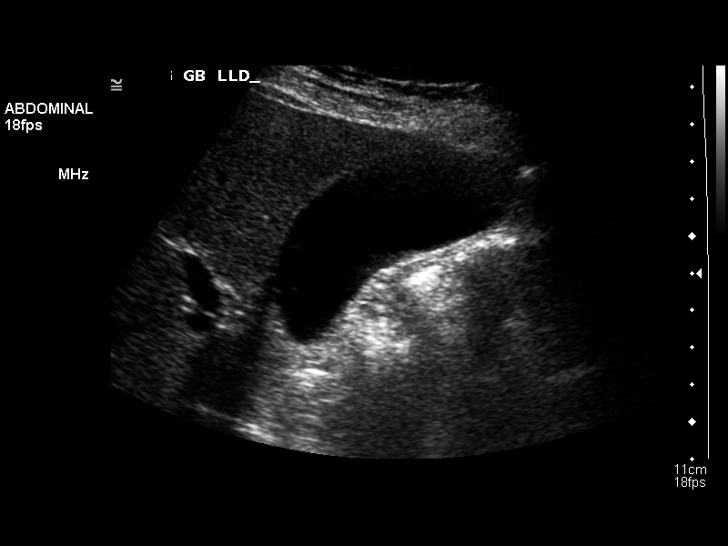
[im 49/54]
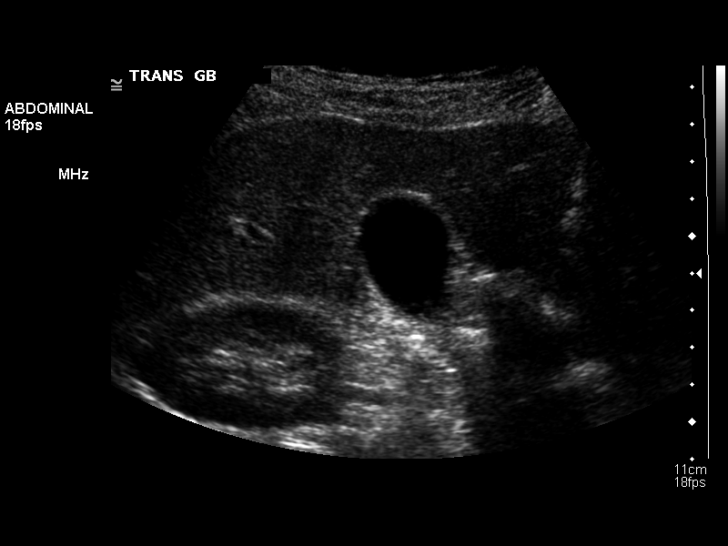
[im 54/54]
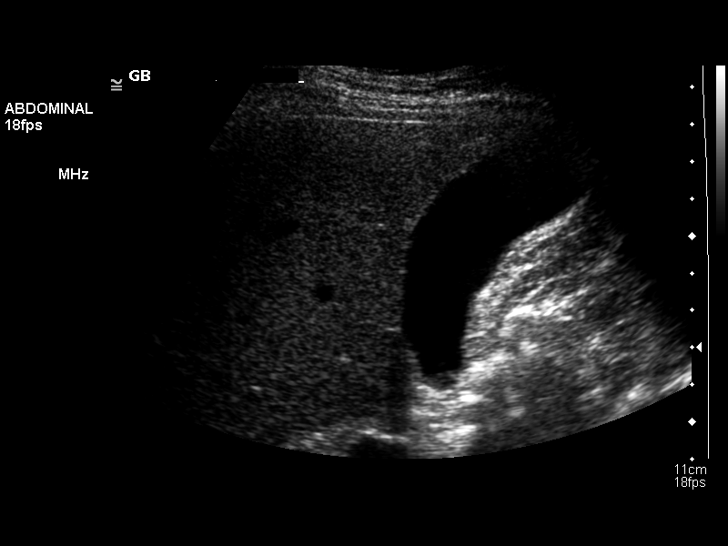

[14 of 25 positions shown; findings below may reference images not displayed]

FINDINGS: Gallbladder:  The gallbladder is moderately distended with no
stones, wall thickening or pericholecystic fluid.

Common bile duct:  Common bile duct is normal in caliber.  No duct
stone is seen.  The duct measures 5.2 mm.

Liver:  The liver shows a coarsened, heterogeneous echotexture
suggesting cirrhosis.  It is normal in overall size.  No discrete
surface nodularity is seen.  No liver mass or focal lesion is
noted.  Hepatopetal flow is noted in the portal vein.
IMPRESSION: Heterogeneous and coarsened liver echotexture suggest cirrhosis. No
liver mass.

Normal gallbladder, no bile duct dilation

## 2019-09-01 ENCOUNTER — Other Ambulatory Visit: Payer: Self-pay

## 2019-09-01 ENCOUNTER — Emergency Department (HOSPITAL_BASED_OUTPATIENT_CLINIC_OR_DEPARTMENT_OTHER)
Admission: EM | Admit: 2019-09-01 | Discharge: 2019-09-01 | Disposition: A | Discharge status: Home / Self Care | Payer: Medicare Other | Attending: Emergency Medicine | Admitting: Emergency Medicine

## 2019-09-01 ENCOUNTER — Encounter (HOSPITAL_BASED_OUTPATIENT_CLINIC_OR_DEPARTMENT_OTHER): Payer: Self-pay | Admitting: Emergency Medicine

## 2019-09-01 DIAGNOSIS — F17210 Nicotine dependence, cigarettes, uncomplicated: Secondary | ICD-10-CM | POA: Insufficient documentation

## 2019-09-01 DIAGNOSIS — Z885 Allergy status to narcotic agent status: Secondary | ICD-10-CM | POA: Insufficient documentation

## 2019-09-01 DIAGNOSIS — Z79899 Other long term (current) drug therapy: Secondary | ICD-10-CM | POA: Insufficient documentation

## 2019-09-01 DIAGNOSIS — N3001 Acute cystitis with hematuria: Secondary | ICD-10-CM | POA: Diagnosis not present

## 2019-09-01 DIAGNOSIS — Z882 Allergy status to sulfonamides status: Secondary | ICD-10-CM | POA: Diagnosis not present

## 2019-09-01 DIAGNOSIS — R300 Dysuria: Secondary | ICD-10-CM | POA: Diagnosis present

## 2019-09-01 DIAGNOSIS — H5712 Ocular pain, left eye: Secondary | ICD-10-CM | POA: Insufficient documentation

## 2019-09-01 LAB — URINALYSIS, ROUTINE W REFLEX MICROSCOPIC
Bilirubin Urine: NEGATIVE
Glucose, UA: NEGATIVE mg/dL
Ketones, ur: NEGATIVE mg/dL
Nitrite: NEGATIVE
Protein, ur: NEGATIVE mg/dL
Specific Gravity, Urine: <1.005 — ABNORMAL LOW (ref 1.005–1.030)
pH: 6.5 (ref 5.0–8.0)

## 2019-09-01 LAB — URINALYSIS, MICROSCOPIC (REFLEX): WBC, UA: >50 WBC/hpf (ref 0–5)

## 2019-09-01 MED ORDER — CEPHALEXIN 500 MG PO CAPS
500.0000 mg | ORAL_CAPSULE | Freq: Four times a day (QID) | ORAL | 0 refills | Status: AC
Start: 2019-09-01 — End: 2019-09-06

## 2019-09-01 MED ORDER — TETRACAINE HCL 0.5 % OP SOLN
OPHTHALMIC | Status: AC
  Filled 2019-09-01: qty 4

## 2019-09-01 MED ORDER — FLUORESCEIN SODIUM 1 MG OP STRP
ORAL_STRIP | OPHTHALMIC | Status: AC
  Filled 2019-09-01: qty 1

## 2019-09-01 NOTE — ED Notes (Signed)
Attempted to draw blood sample, pt refused stating she "wants a prescription for her UTI and eye pain." EDP Dykstra informed.

## 2019-09-01 NOTE — ED Notes (Addendum)
EDP offered eye exam and patient refused. EDP discharged with recommendation for opthalmology follow up and visine. When Rn informed patient of this plan of care, pt put her head in her hans, said "not visine" and requested that EDP perform eye exam because "I'm in too much pain for visine." Primary RN notified. Pt provided RX for keflex

## 2019-09-01 NOTE — ED Provider Notes (Signed)
[facility] EMERGENCY DEPARTMENT Provider Note   CSN: [id number] Arrival date & time: 09/01/19  1648     History Chief Complaint  Patient presents with  . Urinary Tract Infection  . Eye Pain  . Flank Pain    Tiffany [name] is a 54 y.o. female.  Presents emerged bar with concern for UTI.  Reports that her primary doctor who she does not trust diagnosed her with a UTI and prescribed her Macrobid, states that she took 1 dose of this medicine and felt like her head was going to explode.  States that her headache has since gone away and she feels okay now but wants a different antibiotic to take.  States she has had dysuria now for the last week or so.  No abdominal pain, no vomiting, no fever.  Also states that while she was getting ready to come to the emergency department she accidentally hit her left eye with her tweezers.  Has been having some pain and discomfort around her left eye since that time.  Still able to see without difficulty in her left eye.   HPIz     Past Medical History:  Diagnosis Date  . Alcohol abuse   . Anxiety   . Anxiety   . Auditory hallucination   . Bipolar disorder ([place])   . Depression   . GERD (gastroesophageal reflux disease)   . H/O: eczema   . Hallucination, visual   . Hepatitis C carrier ([place])   . Herpes simplex virus infection    history of  . Psychiatric hospitalization   . Schizophrenia ([place])   . Tobacco abuse   . Tubal ectopic pregnancy     Patient Active Problem List   Diagnosis Date Noted  . Bizarre behavior 04/01/2014  . Hypersexuality   . Bipolar disorder, current episode mixed, moderate ([place])   . Bipolar disorder ([place]) 12/04/2013  . Delusional disorder ([place]) 12/01/2013  . Bipolar disorder, unspecified ([place]) 11/30/2013  . Substance induced mood disorder ([place]) 11/30/2013  . Hepatitis C 01/08/2013  . Alcohol abuse 03/28/2011  . Psychosis ([place]) 03/24/2011  . Hyponatremia 03/23/2011  . Hypokalemia 03/23/2011  .  Depression 03/23/2011  . Anxiety 03/23/2011  . Tobacco abuse 03/23/2011  . CERVICALGIA 08/21/2007  . CELLULITIS 05/18/2007  . EPIDERMOID CYST 05/18/2007  . DYSPEPSIA 03/05/2007    Past Surgical History:  Procedure Laterality Date  . elective abortion  1991  . LIVER BIOPSY  1998  . partial left salpingoectomy  05/07/2004   with resection of ectopic pregnancy  . WISDOM TOOTH EXTRACTION       OB History   No obstetric history on file.     Family History  Problem Relation Age of Onset  . COPD Father   . Alcohol abuse Father   . Anxiety disorder Mother   . Hypertension Other     Social History   Tobacco Use  . Smoking status: Current Every Day Smoker    Packs/day: 1.50    Years: 20.00    Pack years: 30.00    Types: Cigarettes  . Smokeless tobacco: Never Used  Substance Use Topics  . Alcohol use: Yes    Alcohol/week: 3.0 standard drinks    Types: 3 Cans of beer per week    Comment: History of drinking 1 six pack of beer daily and 2 six packs on the weekends  . Drug use: Yes    Types: Marijuana    Comment: hx of cocaine use. Patient smokes  marijuana occasionally    Home Medications Prior to Admission medications   Medication Sig Start Date End Date Taking? Authorizing Provider  BuPROPion HCl ER, XL, 450 MG TB24 Take 450 mg by mouth daily. 06/10/14   Thresa Ross, MD  carbamazepine (TEGRETOL) 200 MG tablet Take 200 mg by mouth 2 (two) times daily.    [provider]  cephALEXin (KEFLEX) 500 MG capsule Take 1 capsule (500 mg total) by mouth 4 (four) times daily for 5 days. 09/01/19 09/06/19  Milagros Loll, MD  diphenhydrAMINE (BENADRYL) 25 MG tablet Take 2 tablets (50 mg total) by mouth at bedtime as needed for itching. 06/17/14   Thresa Ross, MD  lurasidone (LATUDA) 40 MG TABS tablet Take 1 tablet (40 mg total) by mouth 2 (two) times daily. 06/17/14   Thresa Ross, MD  naproxen (NAPROSYN) 500 MG tablet Take 500 mg by mouth 2 (two) times daily with a  meal.    [provider]  omeprazole (PRILOSEC) 40 MG capsule Take 40 mg by mouth daily.    [provider]  traZODone (DESYREL) 150 MG tablet Take 150 mg by mouth at bedtime.    [provider]  risperiDONE (RISPERDAL) 0.5 MG tablet Take 1 tablet (0.5 mg total) by mouth 2 (two) times daily. Patient not taking: Reported on 04/09/2014 03/31/14 05/16/14  Charm Rings, NP    Allergies    Sulfa antibiotics and Hydrocodone  Review of Systems   Review of Systems  Constitutional: Negative for chills and fever.  HENT: Negative for ear pain and sore throat.   Eyes: Positive for pain. Negative for visual disturbance.  Respiratory: Negative for cough and shortness of breath.   Cardiovascular: Negative for chest pain and palpitations.  Gastrointestinal: Negative for abdominal pain and vomiting.  Genitourinary: Positive for dysuria. Negative for hematuria.  Musculoskeletal: Negative for arthralgias and back pain.  Skin: Negative for color change and rash.  Neurological: Negative for seizures and syncope.  All other systems reviewed and are negative.   Physical Exam Updated Vital Signs BP 127/83 (BP Location: Right Arm)   Pulse 93   Temp 98 F (36.7 C) (Oral)   Resp 18   SpO2 95%   Physical Exam Vitals and nursing note reviewed.  Constitutional:      General: She is not in acute distress.    Appearance: She is well-developed.  HENT:     Head: Normocephalic and atraumatic.  Eyes:     Conjunctiva/sclera: Conjunctivae normal.  Cardiovascular:     Rate and Rhythm: Normal rate and regular rhythm.     Heart sounds: No murmur.  Pulmonary:     Effort: Pulmonary effort is normal. No respiratory distress.     Breath sounds: Normal breath sounds.  Abdominal:     Palpations: Abdomen is soft.     Tenderness: There is no abdominal tenderness.  Musculoskeletal:        General: No deformity or signs of injury.     Cervical back: Neck supple.  Skin:    General:  Skin is warm and dry.     Capillary Refill: Capillary refill takes less than 2 seconds.  Neurological:     Mental Status: She is alert.     ED Results / Procedures / Treatments   Labs (all labs ordered are listed, but only abnormal results are displayed) Labs Reviewed  URINALYSIS, ROUTINE W REFLEX MICROSCOPIC - Abnormal; Notable for the following components:      Result Value  APPearance CLOUDY (*)    Specific Gravity, Urine <1.005 (*)    Hgb urine dipstick LARGE (*)    Leukocytes,Ua LARGE (*)    All other components within normal limits  URINALYSIS, MICROSCOPIC (REFLEX) - Abnormal; Notable for the following components:   Bacteria, UA FEW (*)    All other components within normal limits    EKG None  Radiology No results found.  Procedures Procedures (including critical care time)  Medications Ordered in ED Medications - No data to display  ED Course  I have reviewed the triage vital signs and the nursing notes.  Pertinent labs & imaging results that were available during my care of the patient were reviewed by me and considered in my medical decision making (see chart for details).    MDM Rules/Calculators/A&P                      54 year old lady who presented to ER with concern for dysuria.  On exam patient was well-appearing, she was concerned that she had a bad reaction to Macrobid that had been originally prescribed by her primary doctor.  As alternate regimen, provided Rx for cephalexin.  No fever, no abdominal or flank pain, no vomiting, doubt pyelonephritis or other acute abdominal process.  Patient reported potential trauma to her left eye.  On initial bedside exam, there is no obvious trauma, no injection, pupils appear normal.  Recommended that obtain slit-lamp exam, fluorescein exam, visual acuity to further assess, rule out corneal abrasion.  Patient did not want to remain in ER for this to be completed and said she would follow-up with her primary  ophthalmologist instead.  Discussed benefits of obtaining this testing and further exam at this time, patient acknowledged my concerns but still declined.    After the discussed management above, the patient was determined to be safe for discharge.  The patient was in agreement with this plan and all questions regarding their care were answered.  ED return precautions were discussed and the patient will return to the ED with any significant worsening of condition.    Final Clinical Impression(s) / ED Diagnoses Final diagnoses:  Acute cystitis with hematuria    Rx / DC Orders ED Discharge Orders         Ordered    cephALEXin (KEFLEX) 500 MG capsule  4 times daily     09/01/19 2008           Milagros Loll, MD 09/01/19 2359

## 2019-09-01 NOTE — Discharge Instructions (Signed)
Recommend taking the antibiotic as prescribed for your UTI.  Please schedule follow-up appointment with your primary doctor.  If you develop worsening abdominal pain, fever vomiting, other new concerning symptom, return to ER for reassessment.  Regarding your tweezer injury, we recommended obtaining a formal eye exam, staining but you declined.  In lieu of this, recommend using Visine teardrops and close recheck with your ophthalmologist.  Ideally to be seen in the next 24 to 48 hours.  If you you change your mind or have worsening pain, blurry vision, return to ER for reassessment.

## 2019-09-01 NOTE — ED Triage Notes (Signed)
PT here with UTI. Started to take marcobid and feels like her brain is swelling. States she now has right flank pain, a fatty liver and she poked herself in her eye with her tweezers and can't open it.

## 2019-09-01 NOTE — ED Notes (Signed)
ED Provider at bedside. 

## 2019-09-01 NOTE — ED Notes (Signed)
Pt ambulated out of department before EDP could be notified regarding request for exam.

## 2019-10-02 ENCOUNTER — Ambulatory Visit: Payer: Medicare Other | Admitting: Family Medicine

## 2019-10-11 ENCOUNTER — Ambulatory Visit: Payer: Medicare Other | Admitting: Family Medicine

## 2019-11-30 ENCOUNTER — Emergency Department (HOSPITAL_BASED_OUTPATIENT_CLINIC_OR_DEPARTMENT_OTHER)
Admission: EM | Admit: 2019-11-30 | Discharge: 2019-11-30 | Discharge status: Against Medical Advice | Payer: Medicare Other

## 2019-11-30 ENCOUNTER — Other Ambulatory Visit: Payer: Self-pay

## 2020-05-06 ENCOUNTER — Other Ambulatory Visit: Payer: Self-pay

## 2020-05-06 ENCOUNTER — Encounter (HOSPITAL_BASED_OUTPATIENT_CLINIC_OR_DEPARTMENT_OTHER): Payer: Self-pay | Admitting: *Deleted

## 2020-05-06 ENCOUNTER — Emergency Department (HOSPITAL_BASED_OUTPATIENT_CLINIC_OR_DEPARTMENT_OTHER)
Admission: EM | Admit: 2020-05-06 | Discharge: 2020-05-06 | Disposition: A | Discharge status: Home / Self Care | Payer: Medicare Other | Attending: Emergency Medicine | Admitting: Emergency Medicine

## 2020-05-06 DIAGNOSIS — F17210 Nicotine dependence, cigarettes, uncomplicated: Secondary | ICD-10-CM | POA: Diagnosis not present

## 2020-05-06 DIAGNOSIS — R195 Other fecal abnormalities: Secondary | ICD-10-CM | POA: Diagnosis not present

## 2020-05-06 MED ORDER — ALBENDAZOLE 200 MG PO TABS: 400.0000 mg | ORAL_TABLET | Freq: Once | ORAL | 0 refills | Status: AC

## 2020-05-06 NOTE — ED Provider Notes (Signed)
[facility] EMERGENCY DEPARTMENT Provider Note   CSN: [id number] Arrival date & time: 05/06/20  2028     History Chief Complaint  Patient presents with  . worms in stool    Tiffany [name] is a 55 y.o. female.  The history is provided by the patient.  Illness Location:  Stool Severity:  Mild Onset quality:  Gradual Timing:  Intermittent Progression:  Waxing and waning Chronicity:  Recurrent Context:  Worms in stool, would like referral to new GI. Relieved by:  Nothing Worsened by:  Nothing  Associated symptoms: diarrhea   Associated symptoms: no abdominal pain and no nausea        Past Medical History:  Diagnosis Date  . Alcohol abuse   . Anxiety   . Anxiety   . Auditory hallucination   . Bipolar disorder ([facility])   . Depression   . GERD (gastroesophageal reflux disease)   . H/O: eczema   . Hallucination, visual   . Hepatitis C carrier ([facility])   . Herpes simplex virus infection    history of  . Psychiatric hospitalization   . Schizophrenia ([facility])   . Tobacco abuse   . Tubal ectopic pregnancy     Patient Active Problem List   Diagnosis Date Noted  . Bizarre behavior 04/01/2014  . Hypersexuality   . Bipolar disorder, current episode mixed, moderate ([facility])   . Bipolar disorder ([facility]) 12/04/2013  . Delusional disorder ([facility]) 12/01/2013  . Bipolar disorder, unspecified ([facility]) 11/30/2013  . Substance induced mood disorder ([facility]) 11/30/2013  . Hepatitis C 01/08/2013  . Alcohol abuse 03/28/2011  . Psychosis ([facility]) 03/24/2011  . Hyponatremia 03/23/2011  . Hypokalemia 03/23/2011  . Depression 03/23/2011  . Anxiety 03/23/2011  . Tobacco abuse 03/23/2011  . CERVICALGIA 08/21/2007  . CELLULITIS 05/18/2007  . EPIDERMOID CYST 05/18/2007  . DYSPEPSIA 03/05/2007    Past Surgical History:  Procedure Laterality Date  . elective abortion  1991  . LIVER BIOPSY  1998  . partial left salpingoectomy  05/07/2004   with resection of ectopic pregnancy  . WISDOM  TOOTH EXTRACTION       OB History   No obstetric history on file.     Family History  Problem Relation Age of Onset  . COPD Father   . Alcohol abuse Father   . Anxiety disorder Mother   . Hypertension Other     Social History   Tobacco Use  . Smoking status: Current Every Day Smoker    Packs/day: 1.50    Years: 20.00    Pack years: 30.00    Types: Cigarettes  . Smokeless tobacco: Never Used  Substance Use Topics  . Alcohol use: Yes    Alcohol/week: 3.0 standard drinks    Types: 3 Cans of beer per week    Comment: History of drinking 1 six pack of beer daily and 2 six packs on the weekends  . Drug use: Yes    Types: Marijuana    Comment: hx of cocaine use. Patient smokes marijuana occasionally    Home Medications Prior to Admission medications   Medication Sig Start Date End Date Taking? Authorizing Provider  albendazole (ALBENZA) 200 MG tablet Take 2 tablets (400 mg total) by mouth once for 1 dose. Take first dose at the 1 and then second dose at day 14 05/06/20 05/06/20 Yes Tahjai Schetter, DO  BuPROPion HCl ER, XL, 450 MG TB24 Take 450 mg by mouth daily. 06/10/14   Thresa Ross, MD  carbamazepine (TEGRETOL)  200 MG tablet Take 200 mg by mouth 2 (two) times daily.    [provider]  diphenhydrAMINE (BENADRYL) 25 MG tablet Take 2 tablets (50 mg total) by mouth at bedtime as needed for itching. 06/17/14   Thresa Ross, MD  lurasidone (LATUDA) 40 MG TABS tablet Take 1 tablet (40 mg total) by mouth 2 (two) times daily. 06/17/14   Thresa Ross, MD  naproxen (NAPROSYN) 500 MG tablet Take 500 mg by mouth 2 (two) times daily with a meal.    [provider]  omeprazole (PRILOSEC) 40 MG capsule Take 40 mg by mouth daily.    [provider]  traZODone (DESYREL) 150 MG tablet Take 150 mg by mouth at bedtime.    [provider]  risperiDONE (RISPERDAL) 0.5 MG tablet Take 1 tablet (0.5 mg total) by mouth 2 (two) times daily. Patient not taking:  Reported on 04/09/2014 03/31/14 05/16/14  Charm Rings, NP    Allergies    Sulfa antibiotics, Cephalexin, Hydrocodone, and Amoxicillin-pot clavulanate  Review of Systems   Review of Systems  Gastrointestinal: Positive for diarrhea. Negative for abdominal distention, abdominal pain, nausea and rectal pain.  Skin: Negative for color change and wound.    Physical Exam Updated Vital Signs BP (!) 153/87   Pulse 96   Temp 97.7 F (36.5 C) (Oral)   Resp 16   Ht 5\' 3"  (1.6 m)   Wt 62.1 kg   SpO2 99%   BMI 24.27 kg/m   Physical Exam Constitutional:      General: She is not in acute distress.    Appearance: She is not ill-appearing.  Abdominal:     General: Abdomen is flat.     Tenderness: There is no abdominal tenderness. There is no guarding.  Skin:    General: Skin is warm.     Capillary Refill: Capillary refill takes less than 2 seconds.  Neurological:     Mental Status: She is alert.     ED Results / Procedures / Treatments   Labs (all labs ordered are listed, but only abnormal results are displayed) Labs Reviewed  OVA + PARASITE EXAM    EKG None  Radiology No results found.  Procedures Procedures (including critical care time)  Medications Ordered in ED Medications - No data to display  ED Course  I have reviewed the triage vital signs and the nursing notes.  Pertinent labs & imaging results that were available during my care of the patient were reviewed by me and considered in my medical decision making (see chart for details).    MDM Rules/Calculators/A&P                          Tiffany [name] is here for treatment for worms in her stool.  Normal vitals.  Stool sample has been provided.  We will test for ova and parasites.  She is requesting treatment and will treat with albendazole.  She also wants referral to GI.  This appears to be a chronic problem.  Could also be some underlying psychiatric illness causing some hallucinations about worms as  she has had multiple complaints in the past for the same.  Overall patient is well-appearing.  No abdominal pain.  We will send stool sample and will treat for possible worms.  Referred to GI.  Understands return precautions.  This chart was dictated using voice recognition software.  Despite best efforts to proofread,  errors can occur  which can change the documentation meaning.     Final Clinical Impression(s) / ED Diagnoses Final diagnoses:  Abnormal stools    Rx / DC Orders ED Discharge Orders         Ordered    albendazole (ALBENZA) 200 MG tablet   Once        05/06/20 2141           Virgina Norfolk, DO 05/06/20 2146

## 2020-05-06 NOTE — ED Triage Notes (Signed)
c/o " worms in stool" x 10 years

## 2020-05-11 LAB — O&P RESULT

## 2020-05-11 LAB — OVA + PARASITE EXAM

## 2021-10-19 ENCOUNTER — Emergency Department (HOSPITAL_BASED_OUTPATIENT_CLINIC_OR_DEPARTMENT_OTHER): Payer: 59

## 2021-10-19 ENCOUNTER — Emergency Department (HOSPITAL_BASED_OUTPATIENT_CLINIC_OR_DEPARTMENT_OTHER)
Admission: EM | Admit: 2021-10-19 | Discharge: 2021-10-19 | Disposition: A | Discharge status: Home / Self Care | Payer: 59 | Attending: Emergency Medicine | Admitting: Emergency Medicine

## 2021-10-19 ENCOUNTER — Encounter (HOSPITAL_BASED_OUTPATIENT_CLINIC_OR_DEPARTMENT_OTHER): Payer: Self-pay

## 2021-10-19 ENCOUNTER — Other Ambulatory Visit: Payer: Self-pay

## 2021-10-19 DIAGNOSIS — M546 Pain in thoracic spine: Secondary | ICD-10-CM | POA: Diagnosis present

## 2021-10-19 DIAGNOSIS — F17210 Nicotine dependence, cigarettes, uncomplicated: Secondary | ICD-10-CM | POA: Diagnosis not present

## 2021-10-19 DIAGNOSIS — G8929 Other chronic pain: Secondary | ICD-10-CM | POA: Insufficient documentation

## 2021-10-19 MED ORDER — LIDOCAINE 5 % EX PTCH
1.0000 | MEDICATED_PATCH | CUTANEOUS | Status: DC
  Administered 2021-10-19: 1 via TRANSDERMAL
  Filled 2021-10-19: qty 1

## 2021-10-19 MED ORDER — METHOCARBAMOL 500 MG PO TABS: 500.0000 mg | ORAL_TABLET | Freq: Three times a day (TID) | ORAL | 0 refills | Status: DC | PRN

## 2021-10-19 MED ORDER — LIDOCAINE 5 % EX PTCH: 1.0000 | MEDICATED_PATCH | CUTANEOUS | 0 refills | Status: DC

## 2021-10-19 NOTE — Discharge Instructions (Addendum)
You were evaluated in the Emergency Department and after careful evaluation, we did not find any emergent condition requiring admission or further testing in the hospital.  Your exam/testing today is overall reassuring.  X-rays did not show any significant abnormalities.  Use the Robaxin as needed for pain as well as the numbing patches.  Follow-up with your primary care doctor and/or a spine specialist.  Please return to the Emergency Department if you experience any worsening of your condition.   Thank you for allowing [place] to be a part of your care.

## 2021-10-19 NOTE — ED Provider Notes (Signed)
  Physical Exam  BP (!) 152/94 (BP Location: Right Arm)   Pulse 84   Temp 98.6 F (37 C) (Oral)   Resp 18   Ht 5\' 2"  (1.575 m)   Wt 69.4 kg   SpO2 99%   BMI 27.98 kg/m   Physical Exam  Procedures  Procedures  ED Course / MDM    Medical Decision Making Care assumed at 3 PM.  Patient is here with upper back pain.  Signed out pending x-rays of the upper back.  3:40 PM Thoracic x-rays were reviewed and interpreted by me.  There are multilevel degenerative changes.  Patient is neuro vascular intact.  Dr. prescribed muscle relaxants and lidocaine patch.  Will refer to neurosurgery outpatient.  I examined her skin and there is no signs of shingles.  Problems Addressed: Chronic left-sided thoracic back pain: acute illness or injury  Amount and/or Complexity of Data Reviewed Radiology: ordered and independent interpretation performed. Decision-making details documented in ED Course.  Risk Prescription drug management.          Pilar Plate, MD 10/19/21 [phone]

## 2021-10-19 NOTE — ED Triage Notes (Signed)
Patient c/o mid back pain x "forever" but states hurts worse today. Patient states she has never been seen for her back pain. Patient states "feels like a pinched nerve"  Patient has not taken her BP medications today.

## 2021-10-19 NOTE — ED Provider Notes (Signed)
[facility]GENCY DEPT [facility] Emergency Department Provider Note MRN:  [id number]  Arrival date & time: 10/19/21     Chief Complaint   Back Pain   History of Present Illness   Tiffany [name] is a 56 y.o. year-old female with a history of bipolar disorder presenting to the ED with chief complaint of back pain.  Left thoracic back pain for 10 years, worse over the past 3 days.  Denies numbness or weakness to the arms or legs, no bowel or bladder dysfunction, no fever, no other complaints.  No trauma.  Review of Systems  A thorough review of systems was obtained and all systems are negative except as noted in the HPI and PMH.   Patient's Health History    Past Medical History:  Diagnosis Date   Alcohol abuse    Anxiety    Anxiety    Auditory hallucination    Bipolar disorder (HCC)    Depression    GERD (gastroesophageal reflux disease)    H/O: eczema    Hallucination, visual    Hepatitis C carrier (HCC)    Herpes simplex virus infection    history of   Psychiatric hospitalization    Schizophrenia (HCC)    Tobacco abuse    Tubal ectopic pregnancy     Past Surgical History:  Procedure Laterality Date   elective abortion  1991   LIVER BIOPSY  1998   partial left salpingoectomy  05/07/2004   with resection of ectopic pregnancy   WISDOM TOOTH EXTRACTION      Family History  Problem Relation Age of Onset   COPD Father    Alcohol abuse Father    Anxiety disorder Mother    Hypertension Other     Social History   Socioeconomic History   Marital status: Single    Spouse name: Not on file   Number of children: Not on file   Years of education: Not on file   Highest education level: Not on file  Occupational History   Not on file  Tobacco Use   Smoking status: Every Day    Packs/day: 1.50    Years: 20.00    Total pack years: 30.00    Types: Cigarettes   Smokeless tobacco: Never  Substance and Sexual Activity   Alcohol use: Yes    Alcohol/week: 3.0  standard drinks of alcohol    Types: 3 Cans of beer per week    Comment: History of drinking 1 six pack of beer daily and 2 six packs on the weekends   Drug use: Yes    Types: Marijuana    Comment: hx of cocaine use. Patient smokes marijuana occasionally   Sexual activity: Not Currently    Birth control/protection: None  Other Topics Concern   Not on file  Social History Narrative   Not on file   Social Determinants of Health   Financial Resource Strain: Not on file  Food Insecurity: Not on file  Transportation Needs: Not on file  Physical Activity: Not on file  Stress: Not on file  Social Connections: Not on file  Intimate Partner Violence: Not on file     Physical Exam   Vitals:   10/19/21 1425 10/19/21 1428  BP: (!) 146/91 (!) 152/94  Pulse: 78 84  Resp: 18 18  Temp: 98.6 F (37 C)   SpO2: 97% 99%    CONSTITUTIONAL: Well-appearing, NAD NEURO/PSYCH:  Alert and oriented x 3, no focal deficits EYES:  eyes equal and  reactive ENT/NECK:  no LAD, no JVD CARDIO: Regular rate, well-perfused, normal S1 and S2 PULM:  CTAB no wheezing or rhonchi GI/GU:  non-distended, non-tender MSK/SPINE:  No gross deformities, no edema SKIN:  no rash, atraumatic   *Additional and/or pertinent findings included in MDM below  Diagnostic and Interventional Summary    EKG Interpretation  Date/Time:    Ventricular Rate:    PR Interval:    QRS Duration:   QT Interval:    QTC Calculation:   R Axis:     Text Interpretation:         Labs Reviewed - No data to display  DG Chest 2 View    (Results Pending)  DG Thoracic Spine 2 View    (Results Pending)    Medications - No data to display   Procedures  /  Critical Care Procedures  ED Course and Medical Decision Making  Initial Impression and Ddx Acute on chronic back pain, no red flag symptoms to suggest myelopathy, obtaining screening x-rays to exclude obvious pneumothorax or heart or lung issues, obvious neoplastic process of  the spine.  Anticipating discharge.  Past medical/surgical history that increases complexity of ED encounter: None  Interpretation of Diagnostics X-rays pending  Patient Reassessment and Ultimate Disposition/Management     Signed out to oncoming provider.  Patient management required discussion with the following services or consulting groups:  None  Complexity of Problems Addressed Acute complicated illness or Injury  Additional Data Reviewed and Analyzed Further history obtained from: None  Additional Factors Impacting ED Encounter Risk Prescriptions  Elmer Sow. Pilar Plate, MD [facility] Emergency Medicine [facility] [email or web address]  Final Clinical Impressions(s) / ED Diagnoses     ICD-10-CM   1. Chronic left-sided thoracic back pain  M54.6    G89.29       ED Discharge Orders          Ordered    methocarbamol (ROBAXIN) 500 MG tablet  Every 8 hours PRN        10/19/21 1511    lidocaine (LIDODERM) 5 %  Every 24 hours        10/19/21 1511             Discharge Instructions Discussed with and Provided to Patient:    Discharge Instructions      You were evaluated in the Emergency Department and after careful evaluation, we did not find any emergent condition requiring admission or further testing in the hospital.  Your exam/testing today is overall reassuring.  X-rays did not show any significant abnormalities.  Use the Robaxin as needed for pain as well as the numbing patches.  Follow-up with your primary care doctor and/or a spine specialist.  Please return to the Emergency Department if you experience any worsening of your condition.   Thank you for allowing [place] to be a part of your care.      Sabas Sous, MD 10/19/21 1515

## 2022-01-05 ENCOUNTER — Emergency Department (HOSPITAL_BASED_OUTPATIENT_CLINIC_OR_DEPARTMENT_OTHER): Payer: 59

## 2022-01-05 ENCOUNTER — Other Ambulatory Visit: Payer: Self-pay

## 2022-01-05 ENCOUNTER — Emergency Department (HOSPITAL_BASED_OUTPATIENT_CLINIC_OR_DEPARTMENT_OTHER)
Admission: EM | Admit: 2022-01-05 | Discharge: 2022-01-05 | Disposition: A | Discharge status: Home / Self Care | Payer: 59 | Attending: Emergency Medicine | Admitting: Emergency Medicine

## 2022-01-05 DIAGNOSIS — R1010 Upper abdominal pain, unspecified: Secondary | ICD-10-CM | POA: Diagnosis present

## 2022-01-05 DIAGNOSIS — F1010 Alcohol abuse, uncomplicated: Secondary | ICD-10-CM | POA: Diagnosis not present

## 2022-01-05 DIAGNOSIS — R079 Chest pain, unspecified: Secondary | ICD-10-CM | POA: Insufficient documentation

## 2022-01-05 DIAGNOSIS — R1012 Left upper quadrant pain: Secondary | ICD-10-CM | POA: Insufficient documentation

## 2022-01-05 LAB — URINALYSIS, ROUTINE W REFLEX MICROSCOPIC
Bilirubin Urine: NEGATIVE
Glucose, UA: NEGATIVE mg/dL
Ketones, ur: NEGATIVE mg/dL
Leukocytes,Ua: NEGATIVE
Nitrite: NEGATIVE
Protein, ur: NEGATIVE mg/dL
Specific Gravity, Urine: 1.01 (ref 1.005–1.030)
pH: 7.5 (ref 5.0–8.0)

## 2022-01-05 LAB — CBC
HCT: 37.1 % (ref 36.0–46.0)
Hemoglobin: 13.3 g/dL (ref 12.0–15.0)
MCH: 33.9 pg (ref 26.0–34.0)
MCHC: 35.8 g/dL (ref 30.0–36.0)
MCV: 94.6 fL (ref 80.0–100.0)
Platelets: 250 10*3/uL (ref 150–400)
RBC: 3.92 MIL/uL (ref 3.87–5.11)
RDW: 12 % (ref 11.5–15.5)
WBC: 3.8 10*3/uL — ABNORMAL LOW (ref 4.0–10.5)
nRBC: 0 % (ref 0.0–0.2)

## 2022-01-05 LAB — COMPREHENSIVE METABOLIC PANEL
ALT: 32 U/L (ref 0–44)
AST: 32 U/L (ref 15–41)
Albumin: 4.4 g/dL (ref 3.5–5.0)
Alkaline Phosphatase: 77 U/L (ref 38–126)
Anion gap: 11 (ref 5–15)
BUN: <5 mg/dL — ABNORMAL LOW (ref 6–20)
CO2: 26 mmol/L (ref 22–32)
Calcium: 9 mg/dL (ref 8.9–10.3)
Chloride: 89 mmol/L — ABNORMAL LOW (ref 98–111)
Creatinine, Ser: 0.63 mg/dL (ref 0.44–1.00)
GFR, Estimated: >60 mL/min (ref >60)
Glucose, Bld: 106 mg/dL — ABNORMAL HIGH (ref 70–99)
Potassium: 3.2 mmol/L — ABNORMAL LOW (ref 3.5–5.1)
Sodium: 126 mmol/L — ABNORMAL LOW (ref 135–145)
Total Bilirubin: 0.5 mg/dL (ref 0.3–1.2)
Total Protein: 7.7 g/dL (ref 6.5–8.1)

## 2022-01-05 LAB — URINALYSIS, MICROSCOPIC (REFLEX)

## 2022-01-05 LAB — TROPONIN I (HIGH SENSITIVITY): Troponin I (High Sensitivity): 2 ng/L (ref <18)

## 2022-01-05 LAB — LIPASE, BLOOD: Lipase: 41 U/L (ref 11–51)

## 2022-01-05 MED ORDER — NALTREXONE HCL 50 MG PO TABS
25.0000 mg | ORAL_TABLET | Freq: Once | ORAL | Status: DC
Start: 2022-01-05 — End: 2022-01-05

## 2022-01-05 MED ORDER — POTASSIUM CHLORIDE CRYS ER 20 MEQ PO TBCR
40.0000 meq | EXTENDED_RELEASE_TABLET | Freq: Once | ORAL | Status: AC
  Administered 2022-01-05: 40 meq via ORAL
  Filled 2022-01-05: qty 2

## 2022-01-05 MED ORDER — METHOCARBAMOL 500 MG PO TABS
1000.0000 mg | ORAL_TABLET | Freq: Once | ORAL | Status: AC
Start: 2022-01-05 — End: 2022-01-05
  Administered 2022-01-05: 1000 mg via ORAL
  Filled 2022-01-05: qty 2

## 2022-01-05 MED ORDER — CHLORDIAZEPOXIDE HCL 25 MG PO CAPS: ORAL_CAPSULE | ORAL | 0 refills | Status: DC

## 2022-01-05 NOTE — ED Provider Notes (Signed)
[place] EMERGENCY DEPARTMENT Provider Note   CSN: [id number] Arrival date & time: 01/05/22  1200     History  Chief Complaint  Patient presents with   Abdominal Pain    Tiffany [name] is a 56 y.o. female with history of bipolar disorder, depression, GERD, Schizophrenia, and alcohol abuse who presents emergency department complaining of upper abdominal pain and left-sided chest pain.  Patient states that she was playing on her phone earlier when she had abrupt onset of sharp upper abdominal pain, followed by a "pulsating" pain over her left collarbone.  The abdominal pain resolved, but the pulsating sensation has not gone away.  She also felt the pain was radiating down her left arm, making her left palm slightly numb.  Does report significant history of alcohol use, last drink was yesterday.  Is requesting medication to help her detox. She is specifically concerned she could be having a heart attack or that her spleen ruptured.  She states that she googled her symptoms and these were possibilities.   Abdominal Pain Associated symptoms: chest pain   Associated symptoms: no chills, no constipation, no cough, no diarrhea, no dysuria, no fever, no nausea, no shortness of breath and no vomiting        Home Medications Prior to Admission medications   Medication Sig Start Date End Date Taking? Authorizing Provider  chlordiazePOXIDE (LIBRIUM) 25 MG capsule 50mg  PO TID x 1D, then 25-50mg  PO BID X 1D, then 25-50mg  PO QD X 1D 01/05/22  Yes Deontay Ladnier T, PA-C  BuPROPion HCl ER, XL, 450 MG TB24 Take 450 mg by mouth daily. 06/10/14   Merian Capron, MD  carbamazepine (TEGRETOL) 200 MG tablet Take 200 mg by mouth 2 (two) times daily.    [provider]  diphenhydrAMINE (BENADRYL) 25 MG tablet Take 2 tablets (50 mg total) by mouth at bedtime as needed for itching. 06/17/14   Merian Capron, MD  lidocaine (LIDODERM) 5 % Place 1 patch onto the skin daily. Remove & Discard  patch within 12 hours or as directed by MD 10/19/21   Maudie Flakes, MD  lurasidone (LATUDA) 40 MG TABS tablet Take 1 tablet (40 mg total) by mouth 2 (two) times daily. 06/17/14   Merian Capron, MD  methocarbamol (ROBAXIN) 500 MG tablet Take 1 tablet (500 mg total) by mouth every 8 (eight) hours as needed for muscle spasms. 10/19/21   Maudie Flakes, MD  naproxen (NAPROSYN) 500 MG tablet Take 500 mg by mouth 2 (two) times daily with a meal.    [provider]  omeprazole (PRILOSEC) 40 MG capsule Take 40 mg by mouth daily.    [provider]  traZODone (DESYREL) 150 MG tablet Take 150 mg by mouth at bedtime.    [provider]  risperiDONE (RISPERDAL) 0.5 MG tablet Take 1 tablet (0.5 mg total) by mouth 2 (two) times daily. Patient not taking: Reported on 04/09/2014 03/31/14 05/16/14  Patrecia Pour, NP      Allergies    Sulfa antibiotics, Cephalexin, and Amoxicillin-pot clavulanate    Review of Systems   Review of Systems  Constitutional:  Negative for chills and fever.  Respiratory:  Negative for cough and shortness of breath.   Cardiovascular:  Positive for chest pain. Negative for leg swelling.  Gastrointestinal:  Positive for abdominal pain. Negative for constipation, diarrhea, nausea and vomiting.  Genitourinary:  Negative for dysuria.  All other systems reviewed and are negative.   Physical Exam Updated  Vital Signs BP (!) 163/96   Pulse 66   Temp 98.1 F (36.7 C) (Oral)   Resp 18   Ht 5\' 2"  (1.575 m)   Wt 68 kg   SpO2 100%   BMI 27.44 kg/m  Physical Exam Vitals and nursing note reviewed.  Constitutional:      Appearance: Normal appearance.  HENT:     Head: Normocephalic and atraumatic.  Eyes:     Conjunctiva/sclera: Conjunctivae normal.  Cardiovascular:     Rate and Rhythm: Normal rate and regular rhythm.  Pulmonary:     Effort: Pulmonary effort is normal. No respiratory distress.     Breath sounds: Normal breath sounds.  Abdominal:      General: There is no distension.     Palpations: Abdomen is soft.     Tenderness: There is no abdominal tenderness.  Skin:    General: Skin is warm and dry.  Neurological:     General: No focal deficit present.     Mental Status: She is alert.     Comments: Sensation intact in bilateral upper extremities     ED Results / Procedures / Treatments   Labs (all labs ordered are listed, but only abnormal results are displayed) Labs Reviewed  COMPREHENSIVE METABOLIC PANEL - Abnormal; Notable for the following components:      Result Value   Sodium 126 (*)    Potassium 3.2 (*)    Chloride 89 (*)    Glucose, Bld 106 (*)    BUN <5 (*)    All other components within normal limits  CBC - Abnormal; Notable for the following components:   WBC 3.8 (*)    All other components within normal limits  URINALYSIS, ROUTINE W REFLEX MICROSCOPIC - Abnormal; Notable for the following components:   Color, Urine STRAW (*)    Hgb urine dipstick TRACE (*)    All other components within normal limits  URINALYSIS, MICROSCOPIC (REFLEX) - Abnormal; Notable for the following components:   Bacteria, UA RARE (*)    All other components within normal limits  LIPASE, BLOOD  TROPONIN I (HIGH SENSITIVITY)  TROPONIN I (HIGH SENSITIVITY)    EKG EKG Interpretation  Date/Time:  Wednesday January 05 2022 12:23:18 EDT Ventricular Rate:  71 PR Interval:  190 QRS Duration: 82 QT Interval:  412 QTC Calculation: 447 R Axis:   55 Text Interpretation: Normal sinus rhythm Normal ECG When compared with ECG of 01-May-2011 18:57, No significant change since last tracing Confirmed by 03-May-2011 [phone]) on 01/05/2022 12:37:46 PM  Radiology DG Chest 2 View  Result Date: 01/05/2022 CLINICAL DATA:  Chest and left upper abdominal pain EXAM: CHEST - 2 VIEW COMPARISON:  11/06/2021 FINDINGS: Cardiac and mediastinal contours are within normal limits. No focal pulmonary opacity. No pleural effusion or pneumothorax. No acute  osseous abnormality. IMPRESSION: No acute cardiopulmonary process. Electronically Signed   By: 11/08/2021 M.D.   On: 01/05/2022 13:13    Procedures Procedures    Medications Ordered in ED Medications  methocarbamol (ROBAXIN) tablet 1,000 mg (1,000 mg Oral Given 01/05/22 1351)  potassium chloride SA (KLOR-CON M) CR tablet 40 mEq (40 mEq Oral Given 01/05/22 1609)    ED Course/ Medical Decision Making/ A&P                           Medical Decision Making Amount and/or Complexity of Data Reviewed Labs: ordered. Radiology: ordered.  Risk Prescription drug management.  This patient is a 56 y.o. female  who presents to the ED for concern of upper abdominal and chest pain.   Differential diagnoses prior to evaluation: The emergent differential diagnosis includes, but is not limited to,  AAA, mesenteric ischemia, appendicitis, diverticulitis, DKA, gastritis/gastroenteritis, nephrolithiasis, pancreatitis, constipation, UTI, bowel obstruction, biliary disease, IBD, PUD, hepatitis, ACS. This is not an exhaustive differential.   Past Medical History / Co-morbidities: bipolar disorder, depression, GERD, Schizophrenia, and alcohol abuse  Physical Exam: Physical exam performed. The pertinent findings include: Upper tensive, otherwise normal vital signs.  Abdomen soft, nontender.  Lab Tests/Imaging studies: I personally interpreted labs/imaging and the pertinent results include: CBC unremarkable.  CMP with sodium 126, appears to be patient's baseline.  Potassium 3.2, normal liver and kidney function.  Urinalysis with trace hemoglobin, negative for infection.  Troponin of 2.  Chest x-ray without acute cardiopulmonary abnormalities.. I agree with the radiologist interpretation.  Cardiac monitoring: EKG obtained and interpreted by my attending physician which shows: normal sinus rhythm   Medications: I ordered medication including potassium replacement.  I have reviewed the patients home  medicines and have made adjustments as needed.   Disposition: After consideration of the diagnostic results and the patients response to treatment, I feel that emergency department workup does not suggest an emergent condition requiring admission or immediate intervention beyond what has been performed at this time. The plan is: Discharged home with taper for Librium to assist with detox, provided with substance abuse resources.  Does not meet CIWA requiring urgent assistance with detox. No acute etiology found for chest or abdominal pain today.  Overall exam and laboratory work-up reassuring.  The patient is safe for discharge and has been instructed to return immediately for worsening symptoms, change in symptoms or any other concerns.   Final Clinical Impression(s) / ED Diagnoses Final diagnoses:  Left upper quadrant abdominal pain  Left-sided chest pain  Alcohol abuse    Rx / DC Orders ED Discharge Orders          Ordered    chlordiazePOXIDE (LIBRIUM) 25 MG capsule        01/05/22 1612           Portions of this report may have been transcribed using voice recognition software. Every effort was made to ensure accuracy; however, inadvertent computerized transcription errors may be present.    Tiffany [name] 01/05/22 1630    Linwood Dibbles, MD 01/06/22 [phone]

## 2022-01-05 NOTE — ED Triage Notes (Signed)
Reported she was texting earlier today when and started feeling unwell all of a sudden; c/o pain on left upper abdominal pain. She reported she is concern for a "heart attack or ruptured spleen". When asked patient when did the symptom started stated "what kind of stupid question is that? Why do you think I called an ambulance if I had pain yesterday". Redirected and explain necessary triage questions patient seemed to understand. A&Ox4.    Patient is upset that the EMS personnel insisted that her complaints was alcoholism and confusion. Patient reported she normally drink 12 beers a day and last one was yesterday. Stated she took a dose of Ativan 1 mg in the ambulance.

## 2022-01-05 NOTE — Discharge Instructions (Addendum)
You were seen in the emergency department today for abdominal pain and collarbone pain.  As we discussed your lab work looked reassuring today.  Your potassium level is slightly low, so we have given you some replacement.  We also looked at the demand on your heart, and this was normal.  I've prescribed you a medicine to assist with detox. You should take two tablets three times daily for 2 days, then 1 tablet three times daily for two days.   Continue to monitor how you're doing and return to the ER for new or worsening symptoms.

## 2022-11-05 ENCOUNTER — Encounter (HOSPITAL_BASED_OUTPATIENT_CLINIC_OR_DEPARTMENT_OTHER): Payer: Self-pay | Admitting: Emergency Medicine

## 2022-11-05 ENCOUNTER — Other Ambulatory Visit: Payer: Self-pay

## 2022-11-05 ENCOUNTER — Emergency Department (HOSPITAL_BASED_OUTPATIENT_CLINIC_OR_DEPARTMENT_OTHER): Payer: 59

## 2022-11-05 ENCOUNTER — Observation Stay (HOSPITAL_BASED_OUTPATIENT_CLINIC_OR_DEPARTMENT_OTHER)
Admission: EM | Admit: 2022-11-05 | Discharge: 2022-11-06 | Discharge status: Against Medical Advice | Payer: 59 | Attending: Internal Medicine | Admitting: Internal Medicine

## 2022-11-05 DIAGNOSIS — F1010 Alcohol abuse, uncomplicated: Secondary | ICD-10-CM | POA: Diagnosis not present

## 2022-11-05 DIAGNOSIS — E876 Hypokalemia: Secondary | ICD-10-CM | POA: Insufficient documentation

## 2022-11-05 DIAGNOSIS — F17210 Nicotine dependence, cigarettes, uncomplicated: Secondary | ICD-10-CM | POA: Diagnosis not present

## 2022-11-05 DIAGNOSIS — E878 Other disorders of electrolyte and fluid balance, not elsewhere classified: Secondary | ICD-10-CM | POA: Insufficient documentation

## 2022-11-05 DIAGNOSIS — E871 Hypo-osmolality and hyponatremia: Secondary | ICD-10-CM | POA: Diagnosis not present

## 2022-11-05 DIAGNOSIS — R1032 Left lower quadrant pain: Secondary | ICD-10-CM | POA: Diagnosis present

## 2022-11-05 DIAGNOSIS — R410 Disorientation, unspecified: Secondary | ICD-10-CM | POA: Diagnosis not present

## 2022-11-05 DIAGNOSIS — K529 Noninfective gastroenteritis and colitis, unspecified: Secondary | ICD-10-CM | POA: Insufficient documentation

## 2022-11-05 DIAGNOSIS — F1910 Other psychoactive substance abuse, uncomplicated: Secondary | ICD-10-CM | POA: Diagnosis not present

## 2022-11-05 LAB — CBC
HCT: 36 % (ref 36.0–46.0)
Hemoglobin: 13.1 g/dL (ref 12.0–15.0)
MCH: 32.8 pg (ref 26.0–34.0)
MCHC: 36.4 g/dL — ABNORMAL HIGH (ref 30.0–36.0)
MCV: 90 fL (ref 80.0–100.0)
Platelets: 220 10*3/uL (ref 150–400)
RBC: 4 MIL/uL (ref 3.87–5.11)
RDW: 11.5 % (ref 11.5–15.5)
WBC: 4.3 10*3/uL (ref 4.0–10.5)
nRBC: 0 % (ref 0.0–0.2)

## 2022-11-05 LAB — URINALYSIS, ROUTINE W REFLEX MICROSCOPIC
Bilirubin Urine: NEGATIVE
Glucose, UA: NEGATIVE mg/dL
Hgb urine dipstick: NEGATIVE
Ketones, ur: NEGATIVE mg/dL
Leukocytes,Ua: NEGATIVE
Nitrite: NEGATIVE
Protein, ur: NEGATIVE mg/dL
Specific Gravity, Urine: 1.015 (ref 1.005–1.030)
pH: 6 (ref 5.0–8.0)

## 2022-11-05 LAB — COMPREHENSIVE METABOLIC PANEL
ALT: 19 U/L (ref 0–44)
AST: 23 U/L (ref 15–41)
Albumin: 4.2 g/dL (ref 3.5–5.0)
Alkaline Phosphatase: 65 U/L (ref 38–126)
Anion gap: 9 (ref 5–15)
BUN: <5 mg/dL — ABNORMAL LOW (ref 6–20)
CO2: 24 mmol/L (ref 22–32)
Calcium: 8.9 mg/dL (ref 8.9–10.3)
Chloride: 86 mmol/L — ABNORMAL LOW (ref 98–111)
Creatinine, Ser: 0.73 mg/dL (ref 0.44–1.00)
GFR, Estimated: >60 mL/min (ref >60)
Glucose, Bld: 108 mg/dL — ABNORMAL HIGH (ref 70–99)
Potassium: 2.4 mmol/L — CL (ref 3.5–5.1)
Sodium: 119 mmol/L — CL (ref 135–145)
Total Bilirubin: 0.5 mg/dL (ref 0.3–1.2)
Total Protein: 7 g/dL (ref 6.5–8.1)

## 2022-11-05 LAB — MAGNESIUM: Magnesium: 1.3 mg/dL — ABNORMAL LOW (ref 1.7–2.4)

## 2022-11-05 LAB — LIPASE, BLOOD: Lipase: 56 U/L — ABNORMAL HIGH (ref 11–51)

## 2022-11-05 LAB — ETHANOL: Alcohol, Ethyl (B): <10 mg/dL (ref <10)

## 2022-11-05 MED ORDER — IOHEXOL 300 MG/ML  SOLN
100.0000 mL | Freq: Once | INTRAMUSCULAR | Status: AC | PRN
  Administered 2022-11-05: 85 mL via INTRAVENOUS

## 2022-11-05 MED ORDER — MAGNESIUM SULFATE 2 GM/50ML IV SOLN
2.0000 g | Freq: Once | INTRAVENOUS | Status: AC
  Administered 2022-11-05: 2 g via INTRAVENOUS
  Filled 2022-11-05: qty 50

## 2022-11-05 MED ORDER — POTASSIUM CHLORIDE 10 MEQ/100ML IV SOLN
10.0000 meq | INTRAVENOUS | Status: AC
  Administered 2022-11-05 – 2022-11-06 (×4): 10 meq via INTRAVENOUS
  Filled 2022-11-05 (×4): qty 100

## 2022-11-05 MED ORDER — SODIUM CHLORIDE 0.9 % IV SOLN: Freq: Once | INTRAVENOUS | Status: AC

## 2022-11-05 MED ORDER — POTASSIUM CHLORIDE CRYS ER 20 MEQ PO TBCR
40.0000 meq | EXTENDED_RELEASE_TABLET | Freq: Once | ORAL | Status: AC
  Administered 2022-11-05: 40 meq via ORAL
  Filled 2022-11-05: qty 2

## 2022-11-05 NOTE — ED Notes (Signed)
Transported to CT 

## 2022-11-05 NOTE — ED Triage Notes (Signed)
Presents from home for diarrhea x 1 month that turned yellow and watery 1 week ago. Now experiencing intermittent LLQ pain and low left back pain starting 4 days ago. Also endorses R flank pain.   Pt also drinks etOH daily, 4 8oz glasses of wine, last drink 5 days ago from current illness. Voice is slow and slurred in triage.   Denies blood in stool, fever  H/o colitis  Last BM today

## 2022-11-05 NOTE — ED Provider Notes (Signed)
[place] EMERGENCY DEPARTMENT AT [facility]  Provider Note  CSN: [id number] Arrival date & time: 11/05/22 2143  History Chief Complaint  Patient presents with   Abdominal Pain    Tiffany [name] is a 57 y.o. female with history of chronic hyponatremia related to her alcohol use disorder presents to the ED for evaluation of LLQ pain for the last 4 days, she suspects is diverticulitis based on prior empiric diagnosis from an Urgent Care but no formal imaging was done then. She denies any fever. No vomiting. No hematochezia. She reports some diarrhea for the last several weeks. She states the pain radiates into her lower back and is intermittent, worse with movement. She denies EtOH use in the last 5 days, drinks ~80oz of water a day.    Home Medications Prior to Admission medications   Medication Sig Start Date End Date Taking? Authorizing Provider  BuPROPion HCl ER, XL, 450 MG TB24 Take 450 mg by mouth daily. 06/10/14   Thresa Ross, MD  carbamazepine (TEGRETOL) 200 MG tablet Take 200 mg by mouth 2 (two) times daily.    [provider]  chlordiazePOXIDE (LIBRIUM) 25 MG capsule 50mg  PO TID x 1D, then 25-50mg  PO BID X 1D, then 25-50mg  PO QD X 1D 01/05/22   Roemhildt, Lorin T, PA-C  diphenhydrAMINE (BENADRYL) 25 MG tablet Take 2 tablets (50 mg total) by mouth at bedtime as needed for itching. 06/17/14   Thresa Ross, MD  lidocaine (LIDODERM) 5 % Place 1 patch onto the skin daily. Remove & Discard patch within 12 hours or as directed by MD 10/19/21   Sabas Sous, MD  lurasidone (LATUDA) 40 MG TABS tablet Take 1 tablet (40 mg total) by mouth 2 (two) times daily. 06/17/14   Thresa Ross, MD  methocarbamol (ROBAXIN) 500 MG tablet Take 1 tablet (500 mg total) by mouth every 8 (eight) hours as needed for muscle spasms. 10/19/21   Sabas Sous, MD  naproxen (NAPROSYN) 500 MG tablet Take 500 mg by mouth 2 (two) times daily with a meal.    [provider]  omeprazole  (PRILOSEC) 40 MG capsule Take 40 mg by mouth daily.    [provider]  traZODone (DESYREL) 150 MG tablet Take 150 mg by mouth at bedtime.    [provider]  risperiDONE (RISPERDAL) 0.5 MG tablet Take 1 tablet (0.5 mg total) by mouth 2 (two) times daily. Patient not taking: Reported on 04/09/2014 03/31/14 05/16/14  Charm Rings, NP     Allergies    Sulfa antibiotics, Cephalexin, and Amoxicillin-pot clavulanate   Review of Systems   Review of Systems Please see HPI for pertinent positives and negatives  Physical Exam BP 116/78   Pulse 91   Temp 97.6 F (36.4 C) (Oral)   Resp 16   Wt 65.8 kg   SpO2 99%   BMI 26.52 kg/m   Physical Exam Vitals and nursing note reviewed.  Constitutional:      Appearance: Normal appearance.  HENT:     Head: Normocephalic and atraumatic.     Nose: Nose normal.     Mouth/Throat:     Mouth: Mucous membranes are moist.  Eyes:     Extraocular Movements: Extraocular movements intact.     Conjunctiva/sclera: Conjunctivae normal.  Cardiovascular:     Rate and Rhythm: Normal rate.  Pulmonary:     Effort: Pulmonary effort is normal.     Breath sounds: Normal breath sounds.  Abdominal:  General: Abdomen is flat.     Palpations: Abdomen is soft.     Tenderness: There is no abdominal tenderness. There is no guarding. Negative signs include Murphy's sign and McBurney's sign.  Musculoskeletal:        General: No swelling. Normal range of motion.     Cervical back: Neck supple.  Skin:    General: Skin is warm and dry.  Neurological:     General: No focal deficit present.     Mental Status: She is alert and oriented to person, place, and time.     Cranial Nerves: No cranial nerve deficit.     Sensory: No sensory deficit.     Motor: No weakness.  Psychiatric:        Mood and Affect: Mood normal.     ED Results / Procedures / Treatments   EKG EKG Interpretation Date/Time:  Saturday November 05 2022 22:48:57  EDT Ventricular Rate:  72 PR Interval:  193 QRS Duration:  93 QT Interval:  440 QTC Calculation: 482 R Axis:   65  Text Interpretation: Sinus rhythm Probable left atrial enlargement Borderline T abnormalities, anterior leads No significant change since last tracing Confirmed by Susy Frizzle [phone]) on 11/05/2022 11:01:29 PM  Procedures Procedures  Medications Ordered in the ED Medications  0.9 %  sodium chloride infusion (has no administration in time range)  potassium chloride SA (KLOR-CON M) CR tablet 40 mEq (has no administration in time range)  potassium chloride 10 mEq in 100 mL IVPB (has no administration in time range)    Initial Impression and Plan  Patient here primarily for LLQ abdominal pain, although her exam is benign, she is concerned for diverticulitis. Labs done in triage show a normal CBC, CMP with low Na (baseline is 126-130) and low K/Mg. Will begin repletion of those. EtOH is neg. Send for CT, anticipate admission.   ED Course       MDM Rules/Calculators/A&P Medical Decision Making Amount and/or Complexity of Data Reviewed Labs: ordered. Radiology: ordered.  Risk Prescription drug management.     Final Clinical Impression(s) / ED Diagnoses Final diagnoses:  None    Rx / DC Orders ED Discharge Orders     None

## 2022-11-05 NOTE — ED Notes (Signed)
Lab notified of add on Magnesium level

## 2022-11-06 ENCOUNTER — Other Ambulatory Visit: Payer: Self-pay

## 2022-11-06 ENCOUNTER — Encounter (HOSPITAL_COMMUNITY): Payer: Self-pay | Admitting: Internal Medicine

## 2022-11-06 DIAGNOSIS — K529 Noninfective gastroenteritis and colitis, unspecified: Secondary | ICD-10-CM

## 2022-11-06 DIAGNOSIS — F17210 Nicotine dependence, cigarettes, uncomplicated: Secondary | ICD-10-CM | POA: Diagnosis not present

## 2022-11-06 DIAGNOSIS — F1910 Other psychoactive substance abuse, uncomplicated: Secondary | ICD-10-CM | POA: Diagnosis present

## 2022-11-06 DIAGNOSIS — E878 Other disorders of electrolyte and fluid balance, not elsewhere classified: Secondary | ICD-10-CM | POA: Diagnosis not present

## 2022-11-06 DIAGNOSIS — E871 Hypo-osmolality and hyponatremia: Principal | ICD-10-CM

## 2022-11-06 DIAGNOSIS — F1010 Alcohol abuse, uncomplicated: Secondary | ICD-10-CM | POA: Diagnosis not present

## 2022-11-06 DIAGNOSIS — E876 Hypokalemia: Secondary | ICD-10-CM | POA: Diagnosis not present

## 2022-11-06 DIAGNOSIS — R1032 Left lower quadrant pain: Secondary | ICD-10-CM | POA: Diagnosis present

## 2022-11-06 DIAGNOSIS — R410 Disorientation, unspecified: Secondary | ICD-10-CM | POA: Diagnosis not present

## 2022-11-06 LAB — HIV ANTIBODY (ROUTINE TESTING W REFLEX): HIV Screen 4th Generation wRfx: NONREACTIVE

## 2022-11-06 LAB — BASIC METABOLIC PANEL
Anion gap: 10 (ref 5–15)
Anion gap: 11 (ref 5–15)
BUN: <5 mg/dL — ABNORMAL LOW (ref 6–20)
BUN: <5 mg/dL — ABNORMAL LOW (ref 6–20)
CO2: 21 mmol/L — ABNORMAL LOW (ref 22–32)
CO2: 20 mmol/L — ABNORMAL LOW (ref 22–32)
Calcium: 8.5 mg/dL — ABNORMAL LOW (ref 8.9–10.3)
Calcium: 8.7 mg/dL — ABNORMAL LOW (ref 8.9–10.3)
Chloride: 97 mmol/L — ABNORMAL LOW (ref 98–111)
Chloride: 97 mmol/L — ABNORMAL LOW (ref 98–111)
Creatinine, Ser: 0.67 mg/dL (ref 0.44–1.00)
Creatinine, Ser: 0.84 mg/dL (ref 0.44–1.00)
GFR, Estimated: >60 mL/min (ref >60)
GFR, Estimated: >60 mL/min (ref >60)
Glucose, Bld: 96 mg/dL (ref 70–99)
Glucose, Bld: 119 mg/dL — ABNORMAL HIGH (ref 70–99)
Potassium: 3.6 mmol/L (ref 3.5–5.1)
Potassium: 3.3 mmol/L — ABNORMAL LOW (ref 3.5–5.1)
Sodium: 128 mmol/L — ABNORMAL LOW (ref 135–145)
Sodium: 128 mmol/L — ABNORMAL LOW (ref 135–145)

## 2022-11-06 LAB — GASTROINTESTINAL PANEL BY PCR, STOOL (REPLACES STOOL CULTURE)

## 2022-11-06 LAB — MAGNESIUM: Magnesium: 1.9 mg/dL (ref 1.7–2.4)

## 2022-11-06 MED ORDER — ACETAMINOPHEN 325 MG PO TABS: 650.0000 mg | ORAL_TABLET | Freq: Four times a day (QID) | ORAL | Status: DC | PRN

## 2022-11-06 MED ORDER — CLOPIDOGREL BISULFATE 75 MG PO TABS: 75.0000 mg | ORAL_TABLET | Freq: Every day | ORAL | Status: DC

## 2022-11-06 MED ORDER — METHOCARBAMOL 500 MG PO TABS: 500.0000 mg | ORAL_TABLET | Freq: Three times a day (TID) | ORAL | Status: DC | PRN

## 2022-11-06 MED ORDER — LORAZEPAM 2 MG/ML IJ SOLN: 1.0000 mg | INTRAMUSCULAR | Status: DC | PRN

## 2022-11-06 MED ORDER — NALTREXONE HCL 50 MG PO TABS: 50.0000 mg | ORAL_TABLET | Freq: Every day | ORAL | Status: DC | PRN

## 2022-11-06 MED ORDER — ONDANSETRON HCL 4 MG PO TABS: 4.0000 mg | ORAL_TABLET | Freq: Four times a day (QID) | ORAL | Status: DC | PRN

## 2022-11-06 MED ORDER — FOLIC ACID 1 MG PO TABS
1.0000 mg | ORAL_TABLET | Freq: Every day | ORAL | Status: DC
  Filled 2022-11-06: qty 1

## 2022-11-06 MED ORDER — ACETAMINOPHEN 650 MG RE SUPP: 650.0000 mg | Freq: Four times a day (QID) | RECTAL | Status: DC | PRN

## 2022-11-06 MED ORDER — METOPROLOL TARTRATE 50 MG PO TABS: 50.0000 mg | ORAL_TABLET | Freq: Two times a day (BID) | ORAL | Status: DC

## 2022-11-06 MED ORDER — THIAMINE MONONITRATE 100 MG PO TABS
100.0000 mg | ORAL_TABLET | Freq: Every day | ORAL | Status: DC
  Filled 2022-11-06: qty 1

## 2022-11-06 MED ORDER — TRAZODONE HCL 50 MG PO TABS: 150.0000 mg | ORAL_TABLET | Freq: Every day | ORAL | Status: DC

## 2022-11-06 MED ORDER — PANTOPRAZOLE SODIUM 40 MG PO TBEC
40.0000 mg | DELAYED_RELEASE_TABLET | Freq: Every day | ORAL | Status: DC
Start: 2022-11-06 — End: 2022-11-06

## 2022-11-06 MED ORDER — LURASIDONE HCL 40 MG PO TABS: 40.0000 mg | ORAL_TABLET | Freq: Two times a day (BID) | ORAL | Status: DC

## 2022-11-06 MED ORDER — LOPERAMIDE HCL 2 MG PO CAPS: 2.0000 mg | ORAL_CAPSULE | ORAL | Status: DC | PRN

## 2022-11-06 MED ORDER — LEVOCETIRIZINE DIHYDROCHLORIDE 5 MG PO TABS: 5.0000 mg | ORAL_TABLET | Freq: Every day | ORAL | Status: DC

## 2022-11-06 MED ORDER — LORAZEPAM 1 MG PO TABS: 1.0000 mg | ORAL_TABLET | ORAL | Status: DC | PRN

## 2022-11-06 MED ORDER — RISPERIDONE 1 MG/ML PO SOLN: 1.0000 mg | Freq: Every day | ORAL | Status: DC

## 2022-11-06 MED ORDER — RISPERIDONE 2 MG PO TABS: 2.0000 mg | ORAL_TABLET | Freq: Every day | ORAL | Status: DC

## 2022-11-06 MED ORDER — ONDANSETRON HCL 4 MG/2ML IJ SOLN: 4.0000 mg | Freq: Four times a day (QID) | INTRAMUSCULAR | Status: DC | PRN

## 2022-11-06 MED ORDER — LIDOCAINE 5 % EX PTCH: 1.0000 | MEDICATED_PATCH | CUTANEOUS | Status: DC

## 2022-11-06 MED ORDER — ENOXAPARIN SODIUM 40 MG/0.4ML IJ SOSY: 40.0000 mg | PREFILLED_SYRINGE | INTRAMUSCULAR | Status: DC

## 2022-11-06 MED ORDER — DULOXETINE HCL 30 MG PO CPEP: 30.0000 mg | ORAL_CAPSULE | Freq: Every day | ORAL | Status: DC

## 2022-11-06 MED ORDER — GABAPENTIN 600 MG PO TABS: 600.0000 mg | ORAL_TABLET | Freq: Three times a day (TID) | ORAL | Status: DC

## 2022-11-06 MED ORDER — POTASSIUM CHLORIDE 10 MEQ/100ML IV SOLN: 10.0000 meq | INTRAVENOUS | Status: DC

## 2022-11-06 MED ORDER — FLUTICASONE PROPIONATE 50 MCG/ACT NA SUSP: 2.0000 | Freq: Every day | NASAL | Status: DC | PRN

## 2022-11-06 MED ORDER — ATORVASTATIN CALCIUM 40 MG PO TABS: 40.0000 mg | ORAL_TABLET | Freq: Every day | ORAL | Status: DC

## 2022-11-06 MED ORDER — HYDROXYZINE PAMOATE 50 MG PO CAPS: 50.0000 mg | ORAL_CAPSULE | Freq: Every day | ORAL | Status: DC

## 2022-11-06 MED ORDER — ORAL CARE MOUTH RINSE: 15.0000 mL | OROMUCOSAL | Status: DC | PRN

## 2022-11-06 MED ORDER — ADULT MULTIVITAMIN W/MINERALS CH
1.0000 | ORAL_TABLET | Freq: Every day | ORAL | Status: DC
  Filled 2022-11-06: qty 1

## 2022-11-06 MED ORDER — THIAMINE HCL 100 MG/ML IJ SOLN: 100.0000 mg | Freq: Every day | INTRAMUSCULAR | Status: DC

## 2022-11-06 MED ORDER — LISINOPRIL 10 MG PO TABS: 10.0000 mg | ORAL_TABLET | Freq: Every day | ORAL | Status: DC

## 2022-11-06 NOTE — Progress Notes (Signed)
New Admission Note:  Arrival Method: [facility] Mental Orientation: alert and oriented x 4 Telemetry: box 21 Assessment: Completed Skin: warm and dry IV: NSL RT AC.  Pain: Denies Tubes: N/A Safety Measures: Safety Fall Prevention Plan initiated.  Admission: Completed 5 M  Orientation: Patient has been orientated to the room, unit and the staff. Welcome booklet given.  Family: None  Orders have been reviewed and implemented. Will continue to monitor the patient. Call light has been placed within reach and bed alarm has been activated.   Guilford Shi BSN, RN  Phone Number: [phone]

## 2022-11-06 NOTE — Assessment & Plan Note (Addendum)
In addition to alcohol, looks like pt also has h/o abuse of THC, methamphetamine (vs appropriate use of prescribed methamphetamine treat ADHD previously, not quite clear which), and cocaine.

## 2022-11-06 NOTE — Assessment & Plan Note (Addendum)
Acute on chronic hyponatremia in setting of ongoing EtOH abuse and elevated free water intake.  Felt to represent beer potomania previously (see also DC summary from [facility] from March) Got NS in ED, will hold off on any additional until we can see how sodium has responded Check repeat BMP now and Q6H Previous work ups showed dilute urine when hyponatremic (not c/w SIADH), repeat urine lytes if pt not improving on repeat BMP.

## 2022-11-06 NOTE — Plan of Care (Signed)
  Problem: Education: Goal: Knowledge of General Education information will improve Description Including pain rating scale, medication(s)/side effects and non-pharmacologic comfort measures Outcome: Progressing   Problem: Education: Goal: Knowledge of General Education information will improve Description Including pain rating scale, medication(s)/side effects and non-pharmacologic comfort measures Outcome: Progressing   

## 2022-11-06 NOTE — Assessment & Plan Note (Addendum)
Possibly collagenous colitis (looks like she has a PMH of this, see [facility] GI note Feb 2023) given neg WBC, lack of other infectious symptoms.  DDx includes infectious colitis. GI pathogen pnl Tylenol / NSAIDs if needed for pain PRN imodium If persistent diarrhea / symptoms despite imodium, trial budesonide (though pt would prefer not to be on steroids, sounds like she had a bad reaction in past to systemic steroids or something)

## 2022-11-06 NOTE — Progress Notes (Signed)
Date: 11/06/2022 Patient: Tiffany [name] Admitted: 11/05/2022 10:43 PM Attending Provider: Rodolph Bong, MD  Tiffany [name] has made the decision to leave against the advice of Rodolph Bong, MD.  She has been informed and understands the inherent risks, including death.  She has decided to accept the responsibility for this decision. Tiffany [name] and all necessary parties have been advised that she may return for further evaluation or treatment. Her condition at time of discharge was Stable.  Tiffany [name] had current vital signs as follows:  Blood pressure 123/68, pulse 71, temperature 98.1 F (36.7 C), resp. rate 20, height 5\' 2"  (1.575 m), weight 66.4 kg, SpO2 98%.   Tiffany [name] has signed the Leaving Against Medical Advice form prior to leaving the department.  Patients IV has been removed prior to leaving and Rodolph Bong, MD has been notified of AMA status.  Juluis Mire 11/06/2022

## 2022-11-06 NOTE — Assessment & Plan Note (Signed)
Looks like sodium usually runs ~126-130

## 2022-11-06 NOTE — Discharge Summary (Signed)
Physician Discharge Summary  Tiffany [name] FAO:[id number] DOB: [birth year] DOA: 11/05/2022  PCP: Jamal Collin, PA-C     Left AMA  Admit date: 11/05/2022 Discharge date: 11/06/2022  Time spent: 15 minutes  Recommendations for Outpatient Follow-up:  Left AMA   Discharge Diagnoses:  Active Problems:   Hyponatremia   Alcohol abuse   Colitis   Chronic hyponatremia   Polysubstance abuse [facility])   Discharge Condition: Left AMA  Diet recommendation: Left AMA  Filed Weights   11/05/22 2153 11/05/22 2159 11/06/22 0309  Weight: 68 kg 65.8 kg 66.4 kg    History of present illness:  HPI per Dr. Roanna Banning is a 57 y.o. 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   Pt in to ED with c/o LLQ abd pain for past 4 days.  She suspects is diverticulitis based on prior empiric diagnosis from an Urgent Care but no formal imaging was done then. She denies any fever. No vomiting. No hematochezia. She reports some diarrhea for the last several weeks.  She denies EtOH use in the last 5 days, drinks ~80oz of water a day.   Further review of chart reveals pt with diagnosis of collagenous colitis according to [facility] GI note from last year.   In ED:  CT reveals mild colitis More impressive is her acute on chronic hyponatremia, sodium of 119 today.  Hospital Course:   Patient left AMA  Procedures: CT abdomen and pelvis 11/05/2022  Consultations: None  Discharge Exam: Vitals:   11/06/22 0215 11/06/22 0323  BP: (!) 149/86 123/68  Pulse: 91 71  Resp: 18 20  Temp:  98.1 F (36.7 C)  SpO2: 99% 98%    General: left AMA Cardiovascular: Left AMA Respiratory: Left AMA  Discharge Instructions  Patient left AMA   Allergies  Allergen Reactions   Sulfa Antibiotics Nausea And Vomiting   Cephalexin Nausea Only   Amoxicillin-Pot Clavulanate Rash      The results of significant diagnostics from this  hospitalization (including imaging, microbiology, ancillary and laboratory) are listed below for reference.    Significant Diagnostic Studies: CT ABDOMEN PELVIS W CONTRAST  Result Date: 11/05/2022 CLINICAL DATA:  Left lower quadrant abdominal pain. EXAM: CT ABDOMEN AND PELVIS WITH CONTRAST TECHNIQUE: Multidetector CT imaging of the abdomen and pelvis was performed using the standard protocol following bolus administration of intravenous contrast. RADIATION DOSE REDUCTION: This exam was performed according to the departmental dose-optimization program which includes automated exposure control, adjustment of the mA and/or kV according to patient size and/or use of iterative reconstruction technique. CONTRAST:  85mL OMNIPAQUE IOHEXOL 300 MG/ML  SOLN COMPARISON:  08/29/2020. FINDINGS: Lower chest: No acute abnormality. Hepatobiliary: No focal liver abnormality is seen. Fatty infiltration of the liver is noted. No gallstones, gallbladder wall thickening, or biliary dilatation. Pancreas: Unremarkable. No pancreatic ductal dilatation or surrounding inflammatory changes. Spleen: Normal in size without focal abnormality. Adrenals/Urinary Tract: The adrenal glands are within normal limits. The kidneys enhance symmetrically. No renal calculus or hydronephrosis. No ureteral calculus or obstructive uropathy. The bladder is unremarkable. Stomach/Bowel: Stomach is within normal limits. Appendix appears normal. There is mild colonic wall thickening involving the descending and sigmoid colon. No free air or pneumatosis. Scattered diverticula are present along the colon without evidence of diverticulitis. Vascular/Lymphatic: Aortic atherosclerosis. No enlarged abdominal or pelvic lymph nodes. Reproductive: Uterus and bilateral adnexa are unremarkable. Other: No abdominopelvic ascites. A small fat containing umbilical hernia is  present. Musculoskeletal: Degenerative changes are noted in the thoracolumbar spine. No acute osseous  abnormality. IMPRESSION: 1. Mild colonic wall thickening involving the descending and sigmoid colon suggesting colitis. 2. Diverticulosis without diverticulitis. 3. Hepatic steatosis. Electronically Signed   By: Thornell Sartorius M.D.   On: 11/05/2022 23:51    Microbiology: No results found for this or any previous visit (from the past 240 hour(s)).   Labs: Basic Metabolic Panel: Recent Labs  Lab 11/05/22 2201 11/06/22 0355 11/06/22 0920  NA 119* 128* 128*  K 2.4* 3.6 3.3*  CL 86* 97* 97*  CO2 24 21* 20*  GLUCOSE 108* 96 119*  BUN <5* <5* <5*  CREATININE 0.73 0.67 0.84  CALCIUM 8.9 8.5* 8.7*  MG 1.3* 1.9  --    Liver Function Tests: Recent Labs  Lab 11/05/22 2201  AST 23  ALT 19  ALKPHOS 65  BILITOT 0.5  PROT 7.0  ALBUMIN 4.2   Recent Labs  Lab 11/05/22 2201  LIPASE 56*   No results for input(s): "AMMONIA" in the last 168 hours. CBC: Recent Labs  Lab 11/05/22 2201  WBC 4.3  HGB 13.1  HCT 36.0  MCV 90.0  PLT 220   Cardiac Enzymes: No results for input(s): "CKTOTAL", "CKMB", "CKMBINDEX", "TROPONINI" in the last 168 hours. BNP: BNP (last 3 results) No results for input(s): "BNP" in the last 8760 hours.  ProBNP (last 3 results) No results for input(s): "PROBNP" in the last 8760 hours.  CBG: No results for input(s): "GLUCAP" in the last 168 hours.     Signed:  Ramiro Harvest MD.  [facility] 11/06/2022, 11:27 AM

## 2022-11-06 NOTE — H&P (Signed)
History and Physical    Patient: Tiffany [name] IEP:[id number] DOB: 1966-03-06 DOA: 11/05/2022 DOS: the patient was seen and examined on 11/06/2022 PCP: Jamal Collin, PA-C  Patient coming from: Home  Chief Complaint:  Chief Complaint  Patient presents with   Abdominal Pain   HPI: Tiffany [name] is a 57 y.o. 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  Pt in to ED with c/o LLQ abd pain for past 4 days.  She suspects is diverticulitis based on prior empiric diagnosis from an Urgent Care but no formal imaging was done then. She denies any fever. No vomiting. No hematochezia. She reports some diarrhea for the last several weeks.  She denies EtOH use in the last 5 days, drinks ~80oz of water a day.  Further review of chart reveals pt with diagnosis of collagenous colitis according to [facility] GI note from last year.  In ED:  CT reveals mild colitis More impressive is her acute on chronic hyponatremia, sodium of 119 today.   Review of Systems: As mentioned in the history of present illness. All other systems reviewed and are negative. Past Medical History:  Diagnosis Date   Alcohol abuse    Anxiety    Anxiety    Auditory hallucination    Bipolar disorder (HCC)    Depression    GERD (gastroesophageal reflux disease)    H/O: eczema    Hallucination, visual    Hepatitis C carrier (HCC)    Herpes simplex virus infection    history of   Psychiatric hospitalization    Schizophrenia (HCC)    Tobacco abuse    Tubal ectopic pregnancy    Past Surgical History:  Procedure Laterality Date   elective abortion  1991   LIVER BIOPSY  1998   partial left salpingoectomy  05/07/2004   with resection of ectopic pregnancy   WISDOM TOOTH EXTRACTION     Social History:  reports that she has been smoking cigarettes. She has a 30 pack-year smoking history. She has never used smokeless tobacco. She reports current alcohol use  of about 3.0 standard drinks of alcohol per week. She reports current drug use. Drug: Marijuana.  Allergies  Allergen Reactions   Sulfa Antibiotics Nausea And Vomiting   Cephalexin Nausea Only   Amoxicillin-Pot Clavulanate Rash    Family History  Problem Relation Age of Onset   COPD Father    Alcohol abuse Father    Anxiety disorder Mother    Hypertension Other     Prior to Admission medications   Medication Sig Start Date End Date Taking? Authorizing Provider  BuPROPion HCl ER, XL, 450 MG TB24 Take 450 mg by mouth daily. 06/10/14   Thresa Ross, MD  carbamazepine (TEGRETOL) 200 MG tablet Take 200 mg by mouth 2 (two) times daily.    [provider]  chlordiazePOXIDE (LIBRIUM) 25 MG capsule 50mg  PO TID x 1D, then 25-50mg  PO BID X 1D, then 25-50mg  PO QD X 1D 01/05/22   Roemhildt, Lorin T, PA-C  diphenhydrAMINE (BENADRYL) 25 MG tablet Take 2 tablets (50 mg total) by mouth at bedtime as needed for itching. 06/17/14   Thresa Ross, MD  lidocaine (LIDODERM) 5 % Place 1 patch onto the skin daily. Remove & Discard patch within 12 hours or as directed by MD 10/19/21   Sabas Sous, MD  lurasidone (LATUDA) 40 MG TABS tablet Take 1 tablet (40 mg total) by mouth 2 (two) times daily.  06/17/14   Thresa Ross, MD  methocarbamol (ROBAXIN) 500 MG tablet Take 1 tablet (500 mg total) by mouth every 8 (eight) hours as needed for muscle spasms. 10/19/21   Sabas Sous, MD  naproxen (NAPROSYN) 500 MG tablet Take 500 mg by mouth 2 (two) times daily with a meal.    [provider]  omeprazole (PRILOSEC) 40 MG capsule Take 40 mg by mouth daily.    [provider]  traZODone (DESYREL) 150 MG tablet Take 150 mg by mouth at bedtime.    [provider]  risperiDONE (RISPERDAL) 0.5 MG tablet Take 1 tablet (0.5 mg total) by mouth 2 (two) times daily. Patient not taking: Reported on 04/09/2014 03/31/14 05/16/14  Charm Rings, NP    Physical Exam: Vitals:   11/06/22 1610  11/06/22 0215 11/06/22 0309 11/06/22 0323  BP:  (!) 149/86  123/68  Pulse:  91  71  Resp:  18  20  Temp: 97.7 F (36.5 C)   98.1 F (36.7 C)  TempSrc: Oral     SpO2:  99%  98%  Weight:   66.4 kg   Height:   5\' 2"  (1.575 m)    Constitutional: NAD, calm, comfortable Respiratory: clear to auscultation bilaterally, no wheezing, no crackles. Normal respiratory effort. No accessory muscle use.  Cardiovascular: Regular rate and rhythm, no murmurs / rubs / gallops. No extremity edema. 2+ pedal pulses. No carotid bruits.  Abdomen: no tenderness, no masses palpated. No hepatosplenomegaly. Bowel sounds positive.  Neurologic: CN 2-12 grossly intact. Sensation intact, DTR normal. Strength 5/5 in all 4.  Psychiatric: Normal judgment and insight. Alert and oriented x 3. Normal mood.   Data Reviewed:    Labs on Admission: I have personally reviewed following labs and imaging studies  CBC: Recent Labs  Lab 11/05/22 2201  WBC 4.3  HGB 13.1  HCT 36.0  MCV 90.0  PLT 220   Basic Metabolic Panel: Recent Labs  Lab 11/05/22 2201  NA 119*  K 2.4*  CL 86*  CO2 24  GLUCOSE 108*  BUN <5*  CREATININE 0.73  CALCIUM 8.9  MG 1.3*   GFR: Estimated Creatinine Clearance: 71 mL/min (by C-G formula based on SCr of 0.73 mg/dL). Liver Function Tests: Recent Labs  Lab 11/05/22 2201  AST 23  ALT 19  ALKPHOS 65  BILITOT 0.5  PROT 7.0  ALBUMIN 4.2   Recent Labs  Lab 11/05/22 2201  LIPASE 56*   No results for input(s): "AMMONIA" in the last 168 hours. Coagulation Profile: No results for input(s): "INR", "PROTIME" in the last 168 hours. Cardiac Enzymes: No results for input(s): "CKTOTAL", "CKMB", "CKMBINDEX", "TROPONINI" in the last 168 hours. BNP (last 3 results) No results for input(s): "PROBNP" in the last 8760 hours. HbA1C: No results for input(s): "HGBA1C" in the last 72 hours. CBG: No results for input(s): "GLUCAP" in the last 168 hours. Lipid Profile: No results for input(s):  "CHOL", "HDL", "LDLCALC", "TRIG", "CHOLHDL", "LDLDIRECT" in the last 72 hours. Thyroid Function Tests: No results for input(s): "TSH", "T4TOTAL", "FREET4", "T3FREE", "THYROIDAB" in the last 72 hours. Anemia Panel: No results for input(s): "VITAMINB12", "FOLATE", "FERRITIN", "TIBC", "IRON", "RETICCTPCT" in the last 72 hours. Urine analysis:    Component Value Date/Time   COLORURINE YELLOW 11/05/2022 2329   APPEARANCEUR CLEAR 11/05/2022 2329   LABSPEC 1.015 11/05/2022 2329   PHURINE 6.0 11/05/2022 2329   GLUCOSEU NEGATIVE 11/05/2022 2329   HGBUR NEGATIVE 11/05/2022 2329   BILIRUBINUR NEGATIVE 11/05/2022 2329  KETONESUR NEGATIVE 11/05/2022 2329   PROTEINUR NEGATIVE 11/05/2022 2329   UROBILINOGEN 0.2 11/28/2013 2259   NITRITE NEGATIVE 11/05/2022 2329   LEUKOCYTESUR NEGATIVE 11/05/2022 2329    Radiological Exams on Admission: CT ABDOMEN PELVIS W CONTRAST  Result Date: 11/05/2022 CLINICAL DATA:  Left lower quadrant abdominal pain. EXAM: CT ABDOMEN AND PELVIS WITH CONTRAST TECHNIQUE: Multidetector CT imaging of the abdomen and pelvis was performed using the standard protocol following bolus administration of intravenous contrast. RADIATION DOSE REDUCTION: This exam was performed according to the departmental dose-optimization program which includes automated exposure control, adjustment of the mA and/or kV according to patient size and/or use of iterative reconstruction technique. CONTRAST:  85mL OMNIPAQUE IOHEXOL 300 MG/ML  SOLN COMPARISON:  08/29/2020. FINDINGS: Lower chest: No acute abnormality. Hepatobiliary: No focal liver abnormality is seen. Fatty infiltration of the liver is noted. No gallstones, gallbladder wall thickening, or biliary dilatation. Pancreas: Unremarkable. No pancreatic ductal dilatation or surrounding inflammatory changes. Spleen: Normal in size without focal abnormality. Adrenals/Urinary Tract: The adrenal glands are within normal limits. The kidneys enhance symmetrically.  No renal calculus or hydronephrosis. No ureteral calculus or obstructive uropathy. The bladder is unremarkable. Stomach/Bowel: Stomach is within normal limits. Appendix appears normal. There is mild colonic wall thickening involving the descending and sigmoid colon. No free air or pneumatosis. Scattered diverticula are present along the colon without evidence of diverticulitis. Vascular/Lymphatic: Aortic atherosclerosis. No enlarged abdominal or pelvic lymph nodes. Reproductive: Uterus and bilateral adnexa are unremarkable. Other: No abdominopelvic ascites. A small fat containing umbilical hernia is present. Musculoskeletal: Degenerative changes are noted in the thoracolumbar spine. No acute osseous abnormality. IMPRESSION: 1. Mild colonic wall thickening involving the descending and sigmoid colon suggesting colitis. 2. Diverticulosis without diverticulitis. 3. Hepatic steatosis. Electronically Signed   By: Thornell Sartorius M.D.   On: 11/05/2022 23:51    EKG: Independently reviewed.   Assessment and Plan: Hyponatremia Acute on chronic hyponatremia in setting of ongoing EtOH abuse and elevated free water intake.  Felt to represent beer potomania previously (see also DC summary from [facility] from March) Got NS in ED, will hold off on any additional until we can see how sodium has responded Check repeat BMP now and Q6H Previous work ups showed dilute urine when hyponatremic (not c/w SIADH), repeat urine lytes if pt not improving on repeat BMP.  Colitis Possibly collagenous colitis (looks like she has a PMH of this, see [facility] GI note Feb 2023) given neg WBC, lack of other infectious symptoms.  DDx includes infectious colitis. GI pathogen pnl Tylenol / NSAIDs if needed for pain PRN imodium If persistent diarrhea / symptoms despite imodium, trial budesonide (though pt would prefer not to be on steroids, sounds like she had a bad reaction in past to systemic steroids or something)  Alcohol abuse CIWA for  withdrawal symptoms. Although, one thing I've noticed that seems a bit odd: for a patient who is reportedly a chronic heavy alcoholic, she almost never seems to have a detectable blood alcohol level when she's seen in the ED over the past 14 years...  Polysubstance abuse (HCC) In addition to alcohol, looks like pt also has h/o abuse of THC, methamphetamine (vs appropriate use of prescribed methamphetamine treat ADHD previously, not quite clear which), and cocaine.  Chronic hyponatremia Looks like sodium usually runs ~126-130      Advance Care Planning:   Code Status: Full Code  Consults: None  Family Communication: No family in room  Severity of Illness: The appropriate patient  status for this patient is OBSERVATION. Observation status is judged to be reasonable and necessary in order to provide the required intensity of service to ensure the patient's safety. The patient's presenting symptoms, physical exam findings, and initial radiographic and laboratory data in the context of their medical condition is felt to place them at decreased risk for further clinical deterioration. Furthermore, it is anticipated that the patient will be medically stable for discharge from the hospital within 2 midnights of admission.   Author: Hillary Bow., DO 11/06/2022 4:47 AM  For on call review [email or web address].

## 2022-11-06 NOTE — Progress Notes (Signed)
   Patient Name: Tiffany [name], Tiffany [name] DOB: 12-18-1965 MRN: [id number] Transferring facility: [facility] Requesting provider: sheldon, md Reason for transfer: hyponatremia 57 yo WF with hx of chronic diarrhea, presents to ER with LLQ pain, bilateral flank pain.  CT shows mild colits. CMP shows acute on chronic hyponatremia of 119. baseline Na 123-129.  drinks etoh heavily.   Going to: [facility] Admission Status: obs Bed Type: med/surg To Do:  [facility] will assume care on arrival to accepting facility. Until arrival, medical decision making responsibilities remain with the EDP.  However, [facility] available 24/7 for questions and assistance.   Nursing staff please page [facility] Admits and Consults [phone]) as soon as the patient arrives to the hospital.  Carollee Herter, DO [facility]

## 2022-11-06 NOTE — Assessment & Plan Note (Addendum)
CIWA for withdrawal symptoms. Although, one thing I've noticed that seems a bit odd: for a patient who is reportedly a chronic heavy alcoholic, she almost never seems to have a detectable blood alcohol level when she's seen in the ED over the past 14 years.Marland KitchenMarland Kitchen

## 2022-11-06 NOTE — ED Notes (Signed)
Report given to [facility]. 

## 2022-11-06 NOTE — ED Notes (Signed)
[facility] called for transport @01 :53am

## 2023-03-30 ENCOUNTER — Emergency Department (HOSPITAL_COMMUNITY)
Admission: EM | Admit: 2023-03-30 | Discharge: 2023-03-30 | Disposition: A | Discharge status: Home / Self Care | Payer: 59 | Attending: Emergency Medicine | Admitting: Emergency Medicine

## 2023-03-30 ENCOUNTER — Emergency Department (HOSPITAL_COMMUNITY): Payer: 59

## 2023-03-30 DIAGNOSIS — Z7902 Long term (current) use of antithrombotics/antiplatelets: Secondary | ICD-10-CM | POA: Diagnosis not present

## 2023-03-30 DIAGNOSIS — R519 Headache, unspecified: Secondary | ICD-10-CM | POA: Diagnosis present

## 2023-03-30 DIAGNOSIS — F1010 Alcohol abuse, uncomplicated: Secondary | ICD-10-CM

## 2023-03-30 DIAGNOSIS — R531 Weakness: Secondary | ICD-10-CM

## 2023-03-30 LAB — I-STAT CHEM 8, ED
BUN: 5 mg/dL — ABNORMAL LOW (ref 6–20)
Calcium, Ion: 1.15 mmol/L (ref 1.15–1.40)
Chloride: 92 mmol/L — ABNORMAL LOW (ref 98–111)
Creatinine, Ser: 0.8 mg/dL (ref 0.44–1.00)
Glucose, Bld: 80 mg/dL (ref 70–99)
HCT: 42 % (ref 36.0–46.0)
Hemoglobin: 14.3 g/dL (ref 12.0–15.0)
Potassium: 4.1 mmol/L (ref 3.5–5.1)
Sodium: 130 mmol/L — ABNORMAL LOW (ref 135–145)
TCO2: 25 mmol/L (ref 22–32)

## 2023-03-30 NOTE — ED Notes (Signed)
Patient took 1 of her Ativan from her pocket book it was 1mg .

## 2023-03-30 NOTE — ED Provider Notes (Addendum)
[place] EMERGENCY DEPARTMENT AT [facility] Provider Note   CSN: [id number] Arrival date & time: 03/30/23  1453     History  Chief Complaint  Patient presents with   Medical Clearance    Tiffany [name] is a 57 y.o. female.  57 year old female presents with multiple complaints.  Her main concern is that she has been off balance and having mild intermittent headaches for several years.  Patient was on Plavix in the past for history of what she describes as carotid disease.  Patient states her neurological exam is benign change now for several weeks.  Patient feels as if she has a blood clot traveling in her body.  Per nursing, patient took a dose of Ativan while she was here.  Does not use alcohol today.  Denies any SI or HI.       Home Medications Prior to Admission medications   Medication Sig Start Date End Date Taking? Authorizing Provider  atorvastatin (LIPITOR) 40 MG tablet Take 40 mg by mouth at bedtime. 11/07/21   [provider]  BuPROPion HCl ER, XL, 450 MG TB24 Take 450 mg by mouth daily. 06/10/14   Thresa Ross, MD  carbamazepine (TEGRETOL) 200 MG tablet Take 200 mg by mouth 2 (two) times daily.    [provider]  chlordiazePOXIDE (LIBRIUM) 25 MG capsule 50mg  PO TID x 1D, then 25-50mg  PO BID X 1D, then 25-50mg  PO QD X 1D 01/05/22   Roemhildt, Lorin T, PA-C  clopidogrel (PLAVIX) 75 MG tablet Take 75 mg by mouth daily. 11/08/21   [provider]  diphenhydrAMINE (BENADRYL) 25 MG tablet Take 2 tablets (50 mg total) by mouth at bedtime as needed for itching. 06/17/14   Thresa Ross, MD  DULoxetine (CYMBALTA) 30 MG capsule Take 30 mg by mouth daily. 11/01/22   [provider]  fluticasone (FLONASE) 50 MCG/ACT nasal spray Place 2 sprays into both nostrils daily as needed for allergies.    [provider]  gabapentin (NEURONTIN) 600 MG tablet Take 600 mg by mouth 3 (three) times daily. 08/20/19   [provider]   hydrOXYzine (VISTARIL) 50 MG capsule Take 50 mg by mouth at bedtime. 11/02/22   [provider]  levocetirizine (XYZAL) 5 MG tablet Take 5 mg by mouth daily. 07/12/22   [provider]  lidocaine (LIDODERM) 5 % Place 1 patch onto the skin daily. Remove & Discard patch within 12 hours or as directed by MD 10/19/21   Sabas Sous, MD  lisinopril (ZESTRIL) 10 MG tablet Take 10 mg by mouth daily. 09/09/22   [provider]  LORazepam (ATIVAN) 1 MG tablet Take 1 mg by mouth 3 (three) times daily as needed for anxiety.    [provider]  lurasidone (LATUDA) 40 MG TABS tablet Take 1 tablet (40 mg total) by mouth 2 (two) times daily. 06/17/14   Thresa Ross, MD  methocarbamol (ROBAXIN) 500 MG tablet Take 1 tablet (500 mg total) by mouth every 8 (eight) hours as needed for muscle spasms. 10/19/21   Sabas Sous, MD  methocarbamol (ROBAXIN) 750 MG tablet Take 750 mg by mouth 3 (three) times daily. 01/06/15   [provider]  metoprolol tartrate (LOPRESSOR) 50 MG tablet Take 50 mg by mouth 2 (two) times daily. 09/19/22   [provider]  naltrexone (DEPADE) 50 MG tablet Take 50 mg by mouth daily as needed (cravings). 08/20/19   [provider]  naproxen (NAPROSYN) 500 MG tablet  Take 500 mg by mouth 2 (two) times daily with a meal.    [provider]  omeprazole (PRILOSEC) 40 MG capsule Take 40 mg by mouth daily.    [provider]  ondansetron (ZOFRAN) 8 MG tablet Take 8 mg by mouth every 8 (eight) hours as needed for nausea or vomiting. 11/02/22   [provider]  risperiDONE (RISPERDAL) 1 MG tablet Take 2 mg by mouth at bedtime. 08/20/19   [provider]  traZODone (DESYREL) 150 MG tablet Take 150 mg by mouth at bedtime.    [provider]  VYVANSE 70 MG capsule Take 70 mg by mouth daily. 11/03/22   [provider]      Allergies    Sulfa antibiotics, Cephalexin, and Amoxicillin-pot clavulanate     Review of Systems   Review of Systems  All other systems reviewed and are negative.   Physical Exam Updated Vital Signs BP (!) 142/93 (BP Location: Right Arm)   Pulse 72   Temp 98.4 F (36.9 C) (Oral)   Resp 17   SpO2 100%  Physical Exam Vitals and nursing note reviewed.  Constitutional:      General: She is not in acute distress.    Appearance: Normal appearance. She is well-developed. She is not toxic-appearing.  HENT:     Head: Normocephalic and atraumatic.  Eyes:     General: Lids are normal.     Conjunctiva/sclera: Conjunctivae normal.     Pupils: Pupils are equal, round, and reactive to light.  Neck:     Thyroid: No thyroid mass.     Trachea: No tracheal deviation.  Cardiovascular:     Rate and Rhythm: Normal rate and regular rhythm.     Heart sounds: Normal heart sounds. No murmur heard.    No gallop.  Pulmonary:     Effort: Pulmonary effort is normal. No respiratory distress.     Breath sounds: Normal breath sounds. No stridor. No decreased breath sounds, wheezing, rhonchi or rales.  Abdominal:     General: There is no distension.     Palpations: Abdomen is soft.     Tenderness: There is no abdominal tenderness. There is no rebound.  Musculoskeletal:        General: No tenderness. Normal range of motion.     Cervical back: Normal range of motion and neck supple.  Skin:    General: Skin is warm and dry.     Findings: No abrasion or rash.  Neurological:     General: No focal deficit present.     Mental Status: She is alert and oriented to person, place, and time. Mental status is at baseline.     GCS: GCS eye subscore is 4. GCS verbal subscore is 5. GCS motor subscore is 6.     Cranial Nerves: No cranial nerve deficit.     Sensory: No sensory deficit.     Motor: Motor function is intact.     Gait: Gait is intact.  Psychiatric:        Attention and Perception: Attention normal.        Speech: Speech normal.        Behavior: Behavior normal.     ED  Results / Procedures / Treatments   Labs (all labs ordered are listed, but only abnormal results are displayed) Labs Reviewed - No data to display  EKG None  Radiology No results found.  Procedures Procedures    Medications Ordered in ED Medications - No  data to display  ED Course/ Medical Decision Making/ A&P                                 Medical Decision Making Amount and/or Complexity of Data Reviewed Radiology: ordered. ECG/medicine tests: ordered.   CT of the head and markable at this time.  Electrolytes also within normal limits.  Patient had been requesting help with her alcohol use disorder.  Will give outpatient referrals.  No evidence of acute withdrawal at this time will discharge to have her follow-up with her doctor        Final Clinical Impression(s) / ED Diagnoses Final diagnoses:  None    Rx / DC Orders ED Discharge Orders     None         Lorre Nick, MD 03/30/23 1717    Lorre Nick, MD 03/30/23 1717

## 2023-03-30 NOTE — ED Triage Notes (Signed)
Pt BIBA from home for 'blood clot traveling under her skin.' Indicates a lump under her neck that has been seen by doctors. Not taking meds. ETOH on board. Wants [facility] rehab. vss

## 2023-06-21 ENCOUNTER — Other Ambulatory Visit: Payer: Self-pay

## 2023-06-21 ENCOUNTER — Emergency Department (HOSPITAL_BASED_OUTPATIENT_CLINIC_OR_DEPARTMENT_OTHER)

## 2023-06-21 ENCOUNTER — Encounter (HOSPITAL_BASED_OUTPATIENT_CLINIC_OR_DEPARTMENT_OTHER): Payer: Self-pay

## 2023-06-21 ENCOUNTER — Emergency Department (HOSPITAL_BASED_OUTPATIENT_CLINIC_OR_DEPARTMENT_OTHER)
Admission: EM | Admit: 2023-06-21 | Discharge: 2023-06-21 | Disposition: A | Discharge status: Home / Self Care | Attending: Emergency Medicine | Admitting: Emergency Medicine

## 2023-06-21 DIAGNOSIS — E871 Hypo-osmolality and hyponatremia: Secondary | ICD-10-CM | POA: Diagnosis not present

## 2023-06-21 DIAGNOSIS — R519 Headache, unspecified: Secondary | ICD-10-CM | POA: Diagnosis present

## 2023-06-21 DIAGNOSIS — Z79899 Other long term (current) drug therapy: Secondary | ICD-10-CM | POA: Insufficient documentation

## 2023-06-21 DIAGNOSIS — I7090 Unspecified atherosclerosis: Secondary | ICD-10-CM

## 2023-06-21 DIAGNOSIS — I10 Essential (primary) hypertension: Secondary | ICD-10-CM | POA: Diagnosis present

## 2023-06-21 DIAGNOSIS — R072 Precordial pain: Secondary | ICD-10-CM

## 2023-06-21 LAB — CBC WITH DIFFERENTIAL/PLATELET
Abs Immature Granulocytes: 0.01 10*3/uL (ref 0.00–0.07)
Basophils Absolute: 0 10*3/uL (ref 0.0–0.1)
Basophils Relative: 0 %
Eosinophils Absolute: 0.2 10*3/uL (ref 0.0–0.5)
Eosinophils Relative: 3 %
HCT: 33.2 % — ABNORMAL LOW (ref 36.0–46.0)
Hemoglobin: 11.5 g/dL — ABNORMAL LOW (ref 12.0–15.0)
Immature Granulocytes: 0 %
Lymphocytes Relative: 22 %
Lymphs Abs: 1.3 10*3/uL (ref 0.7–4.0)
MCH: 32.7 pg (ref 26.0–34.0)
MCHC: 34.6 g/dL (ref 30.0–36.0)
MCV: 94.3 fL (ref 80.0–100.0)
Monocytes Absolute: 0.5 10*3/uL (ref 0.1–1.0)
Monocytes Relative: 8 %
Neutro Abs: 4 10*3/uL (ref 1.7–7.7)
Neutrophils Relative %: 67 %
Platelets: 248 10*3/uL (ref 150–400)
RBC: 3.52 MIL/uL — ABNORMAL LOW (ref 3.87–5.11)
RDW: 13.4 % (ref 11.5–15.5)
WBC: 5.9 10*3/uL (ref 4.0–10.5)
nRBC: 0 % (ref 0.0–0.2)

## 2023-06-21 LAB — COMPREHENSIVE METABOLIC PANEL
ALT: 22 U/L (ref 0–44)
AST: 26 U/L (ref 15–41)
Albumin: 4 g/dL (ref 3.5–5.0)
Alkaline Phosphatase: 57 U/L (ref 38–126)
Anion gap: 10 (ref 5–15)
BUN: 11 mg/dL (ref 6–20)
CO2: 22 mmol/L (ref 22–32)
Calcium: 8.9 mg/dL (ref 8.9–10.3)
Chloride: 93 mmol/L — ABNORMAL LOW (ref 98–111)
Creatinine, Ser: 0.75 mg/dL (ref 0.44–1.00)
GFR, Estimated: >60 mL/min (ref >60)
Glucose, Bld: 104 mg/dL — ABNORMAL HIGH (ref 70–99)
Potassium: 4.1 mmol/L (ref 3.5–5.1)
Sodium: 125 mmol/L — ABNORMAL LOW (ref 135–145)
Total Bilirubin: 0.6 mg/dL (ref 0.0–1.2)
Total Protein: 7 g/dL (ref 6.5–8.1)

## 2023-06-21 LAB — LIPASE, BLOOD: Lipase: 37 U/L (ref 11–51)

## 2023-06-21 LAB — ETHANOL: Alcohol, Ethyl (B): <10 mg/dL (ref <10)

## 2023-06-21 LAB — TROPONIN I (HIGH SENSITIVITY)
Troponin I (High Sensitivity): <2 ng/L (ref <18)
Troponin I (High Sensitivity): <2 ng/L (ref <18)

## 2023-06-21 MED ORDER — LORAZEPAM 2 MG/ML IJ SOLN
1.0000 mg | Freq: Once | INTRAMUSCULAR | Status: AC
  Administered 2023-06-21: 1 mg via INTRAVENOUS
  Filled 2023-06-21: qty 1

## 2023-06-21 MED ORDER — SODIUM CHLORIDE 0.9 % IV BOLUS
1000.0000 mL | Freq: Once | INTRAVENOUS | Status: AC
  Administered 2023-06-21: 1000 mL via INTRAVENOUS

## 2023-06-21 MED ORDER — IOHEXOL 350 MG/ML SOLN
100.0000 mL | Freq: Once | INTRAVENOUS | Status: AC | PRN
  Administered 2023-06-21: 100 mL via INTRAVENOUS

## 2023-06-21 NOTE — ED Provider Notes (Signed)
 [place] EMERGENCY DEPARTMENT AT [facility] Provider Note   CSN: [id number] Arrival date & time: 06/21/23  0158     History  Chief Complaint  Patient presents with   Hypertension    Tiffany [name] is a 58 y.o. female.  The history is provided by the patient and medical records.  Hypertension  Tiffany [name] is a 58 y.o. female who presents to the Emergency Department complaining of multiple complaints.  She presents to the emergency department by EMS after calling for elevated blood pressure with pressure on her head as well as crushing chest pain.  She states she feels like her stomach is convulsing.  She also has pain in her left arm with tingling in her arm.  She is unsure when her symptoms began but states this started just prior to calling EMS.  She feels cold but does not have a fever.  She has shortness of breath.  She does report drinking alcohol today.  She also reports being out of her Ativan for the last few days due to being unable to drive.     Home Medications Prior to Admission medications   Medication Sig Start Date End Date Taking? Authorizing Provider  atorvastatin (LIPITOR) 40 MG tablet Take 40 mg by mouth at bedtime. 11/07/21   [provider]  BuPROPion HCl ER, XL, 450 MG TB24 Take 450 mg by mouth daily. 06/10/14   Thresa Ross, MD  carbamazepine (TEGRETOL) 200 MG tablet Take 200 mg by mouth 2 (two) times daily.    [provider]  chlordiazePOXIDE (LIBRIUM) 25 MG capsule 50mg  PO TID x 1D, then 25-50mg  PO BID X 1D, then 25-50mg  PO QD X 1D 01/05/22   Roemhildt, Lorin T, PA-C  clopidogrel (PLAVIX) 75 MG tablet Take 75 mg by mouth daily. 11/08/21   [provider]  diphenhydrAMINE (BENADRYL) 25 MG tablet Take 2 tablets (50 mg total) by mouth at bedtime as needed for itching. 06/17/14   Thresa Ross, MD  DULoxetine (CYMBALTA) 30 MG capsule Take 30 mg by mouth daily. 11/01/22   [provider]  fluticasone (FLONASE) 50  MCG/ACT nasal spray Place 2 sprays into both nostrils daily as needed for allergies.    [provider]  gabapentin (NEURONTIN) 600 MG tablet Take 600 mg by mouth 3 (three) times daily. 08/20/19   [provider]  hydrOXYzine (VISTARIL) 50 MG capsule Take 50 mg by mouth at bedtime. 11/02/22   [provider]  levocetirizine (XYZAL) 5 MG tablet Take 5 mg by mouth daily. 07/12/22   [provider]  lidocaine (LIDODERM) 5 % Place 1 patch onto the skin daily. Remove & Discard patch within 12 hours or as directed by MD 10/19/21   Sabas Sous, MD  lisinopril (ZESTRIL) 10 MG tablet Take 10 mg by mouth daily. 09/09/22   [provider]  LORazepam (ATIVAN) 1 MG tablet Take 1 mg by mouth 3 (three) times daily as needed for anxiety.    [provider]  lurasidone (LATUDA) 40 MG TABS tablet Take 1 tablet (40 mg total) by mouth 2 (two) times daily. 06/17/14   Thresa Ross, MD  methocarbamol (ROBAXIN) 500 MG tablet Take 1 tablet (500 mg total) by mouth every 8 (eight) hours as needed for muscle spasms. 10/19/21   Sabas Sous, MD  methocarbamol (ROBAXIN) 750 MG tablet Take 750 mg by mouth 3 (three) times daily. 01/06/15   [provider]  metoprolol tartrate (LOPRESSOR) 50  MG tablet Take 50 mg by mouth 2 (two) times daily. 09/19/22   [provider]  naltrexone (DEPADE) 50 MG tablet Take 50 mg by mouth daily as needed (cravings). 08/20/19   [provider]  naproxen (NAPROSYN) 500 MG tablet Take 500 mg by mouth 2 (two) times daily with a meal.    [provider]  omeprazole (PRILOSEC) 40 MG capsule Take 40 mg by mouth daily.    [provider]  ondansetron (ZOFRAN) 8 MG tablet Take 8 mg by mouth every 8 (eight) hours as needed for nausea or vomiting. 11/02/22   [provider]  risperiDONE (RISPERDAL) 1 MG tablet Take 2 mg by mouth at bedtime. 08/20/19   [provider]  traZODone (DESYREL) 150 MG tablet Take  150 mg by mouth at bedtime.    [provider]  VYVANSE 70 MG capsule Take 70 mg by mouth daily. 11/03/22   [provider]      Allergies    Sulfa antibiotics, Cephalexin, and Amoxicillin-pot clavulanate    Review of Systems   Review of Systems  All other systems reviewed and are negative.   Physical Exam Updated Vital Signs BP (!) 155/84   Pulse 64   Temp (!) 97.4 F (36.3 C) (Oral)   Resp (!) 23   SpO2 99%  Physical Exam Vitals and nursing note reviewed.  Constitutional:      Appearance: She is well-developed.  HENT:     Head: Normocephalic and atraumatic.  Cardiovascular:     Rate and Rhythm: Normal rate and regular rhythm.     Heart sounds: No murmur heard. Pulmonary:     Effort: Pulmonary effort is normal. No respiratory distress.     Breath sounds: Normal breath sounds.  Abdominal:     Palpations: Abdomen is soft.     Tenderness: There is no abdominal tenderness. There is no guarding or rebound.  Musculoskeletal:        General: No swelling or tenderness.     Comments: 2+ DP pulses bilaterally  Skin:    General: Skin is warm and dry.  Neurological:     Mental Status: She is alert and oriented to person, place, and time.     Comments: 5 out of 5 strength in all 4 extremities with sensation to light touch intact in all 4 extremities.  No asymmetry of facial movements.  Visual fields grossly intact.  Psychiatric:        Behavior: Behavior normal.     ED Results / Procedures / Treatments   Labs (all labs ordered are listed, but only abnormal results are displayed) Labs Reviewed  COMPREHENSIVE METABOLIC PANEL - Abnormal; Notable for the following components:      Result Value   Sodium 125 (*)    Chloride 93 (*)    Glucose, Bld 104 (*)    All other components within normal limits  CBC WITH DIFFERENTIAL/PLATELET - Abnormal; Notable for the following components:   RBC 3.52 (*)    Hemoglobin 11.5 (*)    HCT 33.2 (*)    All other components  within normal limits  ETHANOL  LIPASE, BLOOD  TROPONIN I (HIGH SENSITIVITY)  TROPONIN I (HIGH SENSITIVITY)    EKG None  Radiology CT Angio Chest/Abd/Pel for Dissection W and/or W/WO Result Date: 06/21/2023 CLINICAL DATA:  58 year old female with headache, left upper extremity numbness, hypertension. Neurologic deficit. EXAM: CT ANGIOGRAPHY CHEST, ABDOMEN AND PELVIS TECHNIQUE: Non-contrast CT of the chest was initially obtained.  Multidetector CT imaging through the chest, abdomen and pelvis was performed using the standard protocol during bolus administration of intravenous contrast. Multiplanar reconstructed images and MIPs were obtained and reviewed to evaluate the vascular anatomy. RADIATION DOSE REDUCTION: This exam was performed according to the departmental dose-optimization program which includes automated exposure control, adjustment of the mA and/or kV according to patient size and/or use of iterative reconstruction technique. CONTRAST:  OMNIPAQUE IOHEXOL 350 MG/ML SOLN COMPARISON:  Portable chest x-ray 0253 hours today. CT Abdomen and Pelvis 11/05/2022. FINDINGS: CTA CHEST FINDINGS Cardiovascular: Calcified coronary artery atherosclerosis on series 301, image 31. Minimal thoracic aortic calcified atherosclerosis. Normal heart size. No pericardial effusion. Following contrast there is no thoracic aortic aneurysm or dissection. Normal 3 vessel arch configuration. Pulmonary arteries also contrast opacified and appear patent. Mediastinum/Nodes: Negative. No mediastinal mass or lymphadenopathy. Lungs/Pleura: Major airways are patent. Lower lung volumes compared to the 2024 CT. Symmetric mild dependent atelectasis. No pleural effusion or other pulmonary finding. Musculoskeletal: Lower cervical and thoracic spine disc and endplate degeneration. No acute osseous abnormality identified. Review of the MIP images confirms the above findings. CTA ABDOMEN AND PELVIS FINDINGS VASCULAR Normal caliber  abdominal aorta. Major arterial structures in the abdomen and pelvis are patent. Distal aortic and iliac artery atherosclerosis stable from the CT last year. No portal venous contrast on these images. Review of the MIP images confirms the above findings. NON-VASCULAR Hepatobiliary: Minimal liver enhancement. Liver and gallbladder appear stable and negative. Pancreas: Negative. Spleen: Diminutive, negative. Adrenals/Urinary Tract: Normal adrenal glands. Stable nonobstructed kidneys. Symmetric renal enhancement. No nephrolithiasis or renal inflammation. Decompressed ureters. Numerous pelvic phleboliths. Unremarkable bladder. Stomach/Bowel: No dilated large or small bowel. Moderate diverticulosis of the distal descending colon and the sigmoid. No active inflammation identified. Fluid in the large bowel to the descending colon. Redundant transverse colon. Cecum on a lax mesentery with normal appendix on series 302, image 168. No discrete large bowel inflammation. Fluid containing nondilated small bowel throughout the abdomen. Stomach is decompressed. No free air, free fluid, or mesenteric inflammation identified. Lymphatic: No lymphadenopathy. Reproductive: Within normal limits. Other: No pelvis free fluid. Musculoskeletal: Advanced lower lumbar disc degeneration with occasional vacuum disc. No acute osseous abnormality identified. Review of the MIP images confirms the above findings. IMPRESSION: 1. Negative for Aortic Aneurysm or Dissection. Calcified coronary artery atherosclerosis is visible. Mild primarily abdominal Aortic Atherosclerosis (ICD10-I70.0). 2. Fluid in nondilated small and large bowel, raising the possibility of Enteritis/Diarrhea. 3. Normal appendix. Large bowel diverticulosis without active inflammation. 4. No acute or inflammatory process identified in the chest, abdomen, or pelvis. Electronically Signed   By: Odessa Fleming M.D.   On: 06/21/2023 04:04   CT Head Wo Contrast Result Date:  06/21/2023 CLINICAL DATA:  58 year old female with headache, left upper extremity numbness, hypertension. Neurologic deficit. EXAM: CT HEAD WITHOUT CONTRAST TECHNIQUE: Contiguous axial images were obtained from the base of the skull through the vertex without intravenous contrast. RADIATION DOSE REDUCTION: This exam was performed according to the departmental dose-optimization program which includes automated exposure control, adjustment of the mA and/or kV according to patient size and/or use of iterative reconstruction technique. COMPARISON:  Brain MRI 11/06/2021.  Head CT 03/30/2023 FINDINGS: Brain: Cerebral volume remains normal. No midline shift, ventriculomegaly, mass effect, evidence of mass lesion, intracranial hemorrhage or evidence of cortically based acute infarction. Gray-white differentiation maintained and within normal limits for age. Vascular: No suspicious intracranial vascular hyperdensity. Mild Calcified atherosclerosis at the skull base. Skull: Stable, intact. Sinuses/Orbits: New  mild mucosal thickening, bubbly opacity in the left sphenoid sinus. Other Visualized paranasal sinuses and mastoids are stable and well aerated. Other: Visualized orbits and scalp soft tissues are within normal limits. IMPRESSION: 1. Stable and negative for age noncontrast CT appearance of the brain. 2. Mild new left sphenoid sinus inflammation. Electronically Signed   By: Odessa Fleming M.D.   On: 06/21/2023 03:56   DG Chest Port 1 View Result Date: 06/21/2023 CLINICAL DATA:  Chest pain, hypertension EXAM: PORTABLE CHEST 1 VIEW COMPARISON:  01/05/2022 FINDINGS: The heart size and mediastinal contours are within normal limits. Both lungs are clear. The visualized skeletal structures are unremarkable. IMPRESSION: No active disease. Electronically Signed   By: Minerva Fester M.D.   On: 06/21/2023 03:08    Procedures Procedures    Medications Ordered in ED Medications  LORazepam (ATIVAN) injection 1 mg (1 mg Intravenous  Given 06/21/23 0301)  iohexol (OMNIPAQUE) 350 MG/ML injection 100 mL (100 mLs Intravenous Contrast Given 06/21/23 0335)  sodium chloride 0.9 % bolus 1,000 mL (0 mLs Intravenous Stopped 06/21/23 0602)    ED Course/ Medical Decision Making/ A&P                                 Medical Decision Making Amount and/or Complexity of Data Reviewed Labs: ordered. Radiology: ordered.  Risk Prescription drug management.   Patient with history of hypertension, schizoaffective disorder, hyperlipidemia here for evaluation of concern for elevated blood pressure, headache and chest pain.  During exam patient did complain of severe and stabbing chest pain.  EKG is not ischemic and troponins are negative x 2.  Given description of symptoms a CT head was obtained as well as CT chest and pelvis.  CT head, CT chest abdomen pelvis negative for acute abnormality.  Discussed incidental finding of atherosclerosis.  Current clinical picture is not consistent with CVA, hypertensive urgency.  Patient does not have any neurologic deficits on examination.  No evidence of dissection.  No evidence of PE.  CMP with hyponatremia, history of same in the past.  She does report that she is not taking salt supplements at this time.  Sodium is not decreased significantly compared to her last.  Feel she is stable for discharge home with close outpatient follow-up and return precautions.  On repeat assessment she is feeling improved without complaint of pain.        Final Clinical Impression(s) / ED Diagnoses Final diagnoses:  Precordial pain  Hyponatremia    Rx / DC Orders ED Discharge Orders     None         Tilden Fossa, MD 06/21/23 [phone]

## 2023-06-21 NOTE — ED Triage Notes (Signed)
 Pt comes via [facility] for hypertension that has been going on all day, pt has not been taking her meds, pt states that she took 2 plavix today by accident. Pt is also having CP that has been going on and off all day. Pt has multiple different complaints. Pt now complains that her L hand is numb and having a headache. LSN now, neuro intact bilaterally, pt reports she is having a "seizure in her stomach"

## 2023-08-06 ENCOUNTER — Other Ambulatory Visit: Payer: Self-pay

## 2023-08-06 ENCOUNTER — Emergency Department (HOSPITAL_BASED_OUTPATIENT_CLINIC_OR_DEPARTMENT_OTHER)

## 2023-08-06 ENCOUNTER — Emergency Department (HOSPITAL_BASED_OUTPATIENT_CLINIC_OR_DEPARTMENT_OTHER)
Admission: EM | Admit: 2023-08-06 | Discharge: 2023-08-06 | Discharge status: Against Medical Advice | Attending: Emergency Medicine | Admitting: Emergency Medicine

## 2023-08-06 ENCOUNTER — Encounter (HOSPITAL_BASED_OUTPATIENT_CLINIC_OR_DEPARTMENT_OTHER): Payer: Self-pay

## 2023-08-06 DIAGNOSIS — Z7901 Long term (current) use of anticoagulants: Secondary | ICD-10-CM | POA: Insufficient documentation

## 2023-08-06 DIAGNOSIS — Z5329 Procedure and treatment not carried out because of patient's decision for other reasons: Secondary | ICD-10-CM | POA: Diagnosis not present

## 2023-08-06 DIAGNOSIS — E871 Hypo-osmolality and hyponatremia: Secondary | ICD-10-CM | POA: Diagnosis not present

## 2023-08-06 DIAGNOSIS — R059 Cough, unspecified: Secondary | ICD-10-CM | POA: Insufficient documentation

## 2023-08-06 DIAGNOSIS — E876 Hypokalemia: Secondary | ICD-10-CM | POA: Diagnosis not present

## 2023-08-06 DIAGNOSIS — R0789 Other chest pain: Secondary | ICD-10-CM | POA: Insufficient documentation

## 2023-08-06 DIAGNOSIS — R079 Chest pain, unspecified: Secondary | ICD-10-CM | POA: Diagnosis present

## 2023-08-06 DIAGNOSIS — Z87891 Personal history of nicotine dependence: Secondary | ICD-10-CM | POA: Insufficient documentation

## 2023-08-06 DIAGNOSIS — F1910 Other psychoactive substance abuse, uncomplicated: Secondary | ICD-10-CM | POA: Diagnosis not present

## 2023-08-06 DIAGNOSIS — R0602 Shortness of breath: Secondary | ICD-10-CM | POA: Diagnosis present

## 2023-08-06 HISTORY — DX: Essential (primary) hypertension: I10

## 2023-08-06 LAB — BASIC METABOLIC PANEL WITH GFR
Anion gap: 3 — ABNORMAL LOW (ref 5–15)
BUN: 6 mg/dL (ref 6–20)
CO2: 13 mmol/L — ABNORMAL LOW (ref 22–32)
Calcium: 4 mg/dL — CL (ref 8.9–10.3)
Chloride: 120 mmol/L — ABNORMAL HIGH (ref 98–111)
Creatinine, Ser: <0.3 mg/dL — ABNORMAL LOW (ref 0.44–1.00)
Glucose, Bld: 61 mg/dL — ABNORMAL LOW (ref 70–99)
Potassium: 1.7 mmol/L — CL (ref 3.5–5.1)
Sodium: 136 mmol/L (ref 135–145)

## 2023-08-06 LAB — COMPREHENSIVE METABOLIC PANEL WITH GFR
ALT: 33 U/L (ref 0–44)
AST: 40 U/L (ref 15–41)
Albumin: 4 g/dL (ref 3.5–5.0)
Alkaline Phosphatase: 70 U/L (ref 38–126)
Anion gap: 9 (ref 5–15)
BUN: 10 mg/dL (ref 6–20)
CO2: 23 mmol/L (ref 22–32)
Calcium: 8.7 mg/dL — ABNORMAL LOW (ref 8.9–10.3)
Chloride: 81 mmol/L — ABNORMAL LOW (ref 98–111)
Creatinine, Ser: 0.72 mg/dL (ref 0.44–1.00)
GFR, Estimated: >60 mL/min (ref >60)
Glucose, Bld: 94 mg/dL (ref 70–99)
Potassium: 3.7 mmol/L (ref 3.5–5.1)
Sodium: 113 mmol/L — CL (ref 135–145)
Total Bilirubin: 0.7 mg/dL (ref 0.0–1.2)
Total Protein: 7.1 g/dL (ref 6.5–8.1)

## 2023-08-06 LAB — CBC
HCT: 35.2 % — ABNORMAL LOW (ref 36.0–46.0)
Hemoglobin: 13 g/dL (ref 12.0–15.0)
MCH: 33.5 pg (ref 26.0–34.0)
MCHC: 36.9 g/dL — ABNORMAL HIGH (ref 30.0–36.0)
MCV: 90.7 fL (ref 80.0–100.0)
Platelets: 225 10*3/uL (ref 150–400)
RBC: 3.88 MIL/uL (ref 3.87–5.11)
RDW: 12.1 % (ref 11.5–15.5)
WBC: 5.3 10*3/uL (ref 4.0–10.5)
nRBC: 0 % (ref 0.0–0.2)

## 2023-08-06 LAB — RESP PANEL BY RT-PCR (RSV, FLU A&B, COVID)  RVPGX2
Influenza A by PCR: NEGATIVE
Influenza B by PCR: NEGATIVE
Resp Syncytial Virus by PCR: NEGATIVE
SARS Coronavirus 2 by RT PCR: NEGATIVE

## 2023-08-06 LAB — SODIUM, URINE, RANDOM: Sodium, Ur: 15 mmol/L

## 2023-08-06 LAB — MAGNESIUM: Magnesium: 1.8 mg/dL (ref 1.7–2.4)

## 2023-08-06 LAB — PHOSPHORUS: Phosphorus: 3.2 mg/dL (ref 2.5–4.6)

## 2023-08-06 LAB — OSMOLALITY, URINE: Osmolality, Ur: 163 mosm/kg — ABNORMAL LOW (ref 300–900)

## 2023-08-06 LAB — TROPONIN I (HIGH SENSITIVITY)
Troponin I (High Sensitivity): <2 ng/L (ref <18)
Troponin I (High Sensitivity): <2 ng/L (ref <18)

## 2023-08-06 LAB — LIPASE, BLOOD: Lipase: 43 U/L (ref 11–51)

## 2023-08-06 LAB — CREATININE, URINE, RANDOM: Creatinine, Urine: 18 mg/dL

## 2023-08-06 MED ORDER — LIDOCAINE VISCOUS HCL 2 % MT SOLN
15.0000 mL | Freq: Once | OROMUCOSAL | Status: AC
  Administered 2023-08-06: 15 mL via ORAL
  Filled 2023-08-06: qty 15

## 2023-08-06 MED ORDER — SODIUM CHLORIDE 0.9 % IV SOLN: INTRAVENOUS | Status: DC

## 2023-08-06 MED ORDER — FAMOTIDINE 20 MG PO TABS
20.0000 mg | ORAL_TABLET | Freq: Once | ORAL | Status: AC
  Administered 2023-08-06: 20 mg via ORAL
  Filled 2023-08-06: qty 1

## 2023-08-06 MED ORDER — SODIUM CHLORIDE 0.9 % IV BOLUS
1000.0000 mL | Freq: Once | INTRAVENOUS | Status: AC
  Administered 2023-08-06: 1000 mL via INTRAVENOUS

## 2023-08-06 MED ORDER — SODIUM CHLORIDE 1 G PO TABS: 1.0000 g | ORAL_TABLET | Freq: Every day | ORAL | 0 refills | Status: AC

## 2023-08-06 MED ORDER — ALUM & MAG HYDROXIDE-SIMETH 200-200-20 MG/5ML PO SUSP
30.0000 mL | Freq: Once | ORAL | Status: AC
  Administered 2023-08-06: 30 mL via ORAL
  Filled 2023-08-06: qty 30

## 2023-08-06 MED ORDER — SODIUM CHLORIDE 0.9 % IV BOLUS: 1000.0000 mL | Freq: Once | INTRAVENOUS | Status: DC

## 2023-08-06 MED ORDER — LORAZEPAM 2 MG/ML IJ SOLN
1.0000 mg | Freq: Once | INTRAMUSCULAR | Status: AC
  Administered 2023-08-06: 1 mg via INTRAVENOUS
  Filled 2023-08-06: qty 1

## 2023-08-06 NOTE — ED Notes (Signed)
 Received call from [facility] will arrive within 10 mins for patient transport ED Nurse aware  Received call @ 22:02

## 2023-08-06 NOTE — ED Provider Notes (Signed)
 Repeat BMP with hypoK and Ca strongly suspected to be lab error with Na 136 after minimal IV saline infusion.  Patient not present for redraw but had been encouraged to return ASAP to hospital regardless.   Arvilla Birmingham, MD 08/06/23 2250

## 2023-08-06 NOTE — ED Provider Notes (Signed)
 This is a 58 year old female who presented to the ED with acute on chronic hyponatremia.  She has a history of beer [place] mania in the past but reports that she no longer drinks beer, only wine, about 4 cups a day.  Her sodium level was significantly lower today at 113.  She was given normal saline and then recommended for admission.  However following initial admission, the patient reported that she could no longer stay in the hospital.  She cites concerns that she may get evicted from her apartment in 2 days, needs to come to court tomorrow for an emergency appeal.  She also has a cane that she needs to take care of at home.  She understands the risk of leaving the hospital could be worsening electrolyte derangement, seizure, coma and death.  We discussed how to gradually reduce her drinking incrementally over 2 weeks to help stave off withdrawal, which she has never had significantly before.  I can prescribe her a short course of sodium tablets but would not prefer to have her on long course salt tablets without reliable recheck of her sodium levels.  I strongly encouraged her to return to the hospital at the first possibility for likely medical admission at that time.  She verbalized understanding.  She will be leaving AGAINST MEDICAL ADVICE.   Arvilla Birmingham, MD 08/06/23 2227

## 2023-08-06 NOTE — ED Triage Notes (Signed)
 SHOB and central chest pressure for 3 days. Reports nasuea, headache, dizziness, mid back pain

## 2023-08-06 NOTE — ED Notes (Signed)
 Reached out to bed placement in regards to bed being available. Rep states once bed is in ready status then bed is ready, and to wait until the room is changed from dirty to ready. Called @ 20:05

## 2023-08-06 NOTE — ED Notes (Signed)
 Patient transported to X-ray

## 2023-08-06 NOTE — Discharge Instructions (Addendum)
 Your sodium or salt levels in your blood are dangerously low.  This can lead to seizure, coma, confusion and death.  We had strongly recommended medical admission but you decided to leave AGAINST MEDICAL ADVICE.  You understand by leaving that you may be at serious risk of worsening illness and even death.  I strongly encourage you to return to the hospital as soon as possible for likely admission.  I recommend that you gradually cut back on your alcohol drinking over the next 2 weeks, measuring your daily drinks and reducing it little by little over 2 weeks to 0.  Alcohol may be causing your worsening sodium and salt level problems.  I also prescribed you 10 days of salt or sodium tablets to help raise your salt levels.  It is extremely important that you have a doctor's clinic recheck your sodium levels within 1 to 2 weeks.

## 2023-08-06 NOTE — ED Provider Notes (Signed)
 [facility] Provider Note   CSN: [id number] Arrival date & time: 08/06/23  1616     History  Chief Complaint  Patient presents with   Chest Pain    Tiffany [name] is a 58 y.o. female.   Chest Pain   58 year old female presents emergency department with complaints of chest pain, shortness of breath, headache.  States that she has been dealing with headache for the past 3 days or so.  Reports pain in the top of her head.  States this is typical of prior headaches that she has had in the past.  Has taken Tylenol  without significant improvement of symptoms.  Denies any visual disturbance, gait abnormality, slurred speech, facial droop, weakness/sensory deficit of lower extremities.  Also reporting chest pain.  States this began this morning around 9 AM when she woke up.  States she initially felt like her heart rate was up and subsequently felt increasingly anxious.  Then reports central chest pain described as pressure/crushing.  States that symptoms have been constant since onset.  Denies exertional worsening of symptoms, worsening symptoms with consumption of food/liquids.  Does states that pain is somewhat worsened when she lies on her back.  States that she is also having pain in her upper right back.  States that she has had this pain for "years and years."  States that this pain is worsened with movement and relieved with rest.  Has any fevers, chills, cough, nausea, vomiting, urinary symptoms.  Patient reports chronic alcohol use.  Past medical history significant for collagenous colitis, reported mania, alcohol abuse, bipolar disorder, GERD, hep C carrier, HSV, schizophrenia, tobacco abuse,, chronic hyponatremia, polysubstance abuse  Home Medications Prior to Admission medications   Medication Sig Start Date End Date Taking? Authorizing Provider  atorvastatin  (LIPITOR) 40 MG tablet Take 40 mg by mouth at bedtime. 11/07/21   [provider]  BuPROPion  HCl ER, XL, 450 MG TB24 Take 450 mg by mouth daily. 06/10/14   Wray Heady, MD  carbamazepine  (TEGRETOL ) 200 MG tablet Take 200 mg by mouth 2 (two) times daily.    [provider]  chlordiazePOXIDE  (LIBRIUM ) 25 MG capsule 50mg  PO TID x 1D, then 25-50mg  PO BID X 1D, then 25-50mg  PO QD X 1D 01/05/22   Roemhildt, Lorin T, PA-C  clopidogrel  (PLAVIX ) 75 MG tablet Take 75 mg by mouth daily. 11/08/21   [provider]  diphenhydrAMINE  (BENADRYL ) 25 MG tablet Take 2 tablets (50 mg total) by mouth at bedtime as needed for itching. 06/17/14   Wray Heady, MD  DULoxetine  (CYMBALTA ) 30 MG capsule Take 30 mg by mouth daily. 11/01/22   [provider]  fluticasone  (FLONASE ) 50 MCG/ACT nasal spray Place 2 sprays into both nostrils daily as needed for allergies.    [provider]  gabapentin  (NEURONTIN ) 600 MG tablet Take 600 mg by mouth 3 (three) times daily. 08/20/19   [provider]  hydrOXYzine  (VISTARIL ) 50 MG capsule Take 50 mg by mouth at bedtime. 11/02/22   [provider]  levocetirizine (XYZAL ) 5 MG tablet Take 5 mg by mouth daily. 07/12/22   [provider]  lidocaine  (LIDODERM ) 5 % Place 1 patch onto the skin daily. Remove & Discard patch within 12 hours or as directed by MD 10/19/21   Edson Graces, MD  lisinopril  (ZESTRIL ) 10 MG tablet Take 10 mg by mouth daily. 09/09/22   [provider]  LORazepam  (ATIVAN ) 1 MG tablet Take  1 mg by mouth 3 (three) times daily as needed for anxiety.    [provider]  lurasidone  (LATUDA ) 40 MG TABS tablet Take 1 tablet (40 mg total) by mouth 2 (two) times daily. 06/17/14   Wray Heady, MD  methocarbamol  (ROBAXIN ) 500 MG tablet Take 1 tablet (500 mg total) by mouth every 8 (eight) hours as needed for muscle spasms. 10/19/21   Edson Graces, MD  methocarbamol  (ROBAXIN ) 750 MG tablet Take 750 mg by mouth 3 (three) times daily. 01/06/15   [provider]   metoprolol  tartrate (LOPRESSOR ) 50 MG tablet Take 50 mg by mouth 2 (two) times daily. 09/19/22   [provider]  naltrexone  (DEPADE) 50 MG tablet Take 50 mg by mouth daily as needed (cravings). 08/20/19   [provider]  naproxen  (NAPROSYN ) 500 MG tablet Take 500 mg by mouth 2 (two) times daily with a meal.    [provider]  omeprazole  (PRILOSEC) 40 MG capsule Take 40 mg by mouth daily.    [provider]  ondansetron  (ZOFRAN ) 8 MG tablet Take 8 mg by mouth every 8 (eight) hours as needed for nausea or vomiting. 11/02/22   [provider]  risperiDONE  (RISPERDAL ) 1 MG tablet Take 2 mg by mouth at bedtime. 08/20/19   [provider]  traZODone  (DESYREL ) 150 MG tablet Take 150 mg by mouth at bedtime.    [provider]  VYVANSE 70 MG capsule Take 70 mg by mouth daily. 11/03/22   [provider]      Allergies    Sulfa antibiotics, Cephalexin , and Amoxicillin-pot clavulanate    Review of Systems   Review of Systems  Cardiovascular:  Positive for chest pain.  All other systems reviewed and are negative.   Physical Exam Updated Vital Signs There were no vitals taken for this visit. Physical Exam Vitals and nursing note reviewed.  Constitutional:      General: She is not in acute distress.    Appearance: She is well-developed.  HENT:     Head: Normocephalic and atraumatic.  Eyes:     Conjunctiva/sclera: Conjunctivae normal.  Cardiovascular:     Rate and Rhythm: Normal rate and regular rhythm.     Pulses: Normal pulses.     Heart sounds: No murmur heard. Pulmonary:     Effort: Pulmonary effort is normal. No respiratory distress.     Breath sounds: Normal breath sounds. No wheezing, rhonchi or rales.  Chest:     Chest wall: Tenderness present.  Abdominal:     Palpations: Abdomen is soft.     Tenderness: There is no abdominal tenderness.  Musculoskeletal:        General: No swelling.     Cervical back: Neck  supple.     Right lower leg: No edema.     Left lower leg: No edema.  Skin:    General: Skin is warm and dry.     Capillary Refill: Capillary refill takes less than 2 seconds.  Neurological:     Mental Status: She is alert.  Psychiatric:        Mood and Affect: Mood normal.     ED Results / Procedures / Treatments   Labs (all labs ordered are listed, but only abnormal results are displayed) Labs Reviewed - No data to display  EKG None  Radiology No results found.  Procedures .Critical Care  Performed by: [place] Butter, PA Authorized by: Delavan Butter, PA   Critical care  provider statement:    Critical care time (minutes):  46   Critical care was necessary to treat or prevent imminent or life-threatening deterioration of the following conditions:  Metabolic crisis   Critical care was time spent personally by me on the following activities:  Development of treatment plan with patient or surrogate, discussions with consultants, evaluation of patient's response to treatment, examination of patient, ordering and review of laboratory studies, ordering and review of radiographic studies, ordering and performing treatments and interventions, pulse oximetry, re-evaluation of patient's condition and review of old charts   I assumed direction of critical care for this patient from another provider in my specialty: no     Care discussed with: admitting provider       Medications Ordered in ED Medications - No data to display  ED Course/ Medical Decision Making/ A&P Clinical Course as of 08/06/23 1901  Sun Aug 06, 2023  1721 Upon reevaluation, she states that she has had chronic diarrhea for years.  Reports increasing diarrhea over the past couple of weeks with 4-5 episodes daily.  Denies melena, hematochezia.  States that she drinks 4-5 12 to 16 ounce containers of wine mixed with club soda daily. [CR]  1900 Consulted hospitalist Dr. Arnita Bible who agreed with admission and  assume further treatment/care. [CR]    Clinical Course User Index [CR] Savannah Butter, PA                                 Medical Decision Making Amount and/or Complexity of Data Reviewed Labs: ordered. Radiology: ordered.  Risk OTC drugs. Prescription drug management. Decision regarding hospitalization.   This patient presents to the ED for concern of chest pain, this involves an extensive number of treatment options, and is a complaint that carries with it a high risk of complications and morbidity.  The differential diagnosis includes ACS, PE, aortic dissection, pneumothorax, pneumonia, anxiety, GERD, pancreatitis, cholecystitis, other   Co morbidities that complicate the patient evaluation  See HPI   Additional history obtained:  Additional history obtained from EMR External records from outside source obtained and reviewed including hospital records   Lab Tests:  I Ordered, and personally interpreted labs.  The pertinent results include: No leukocytosis.  No evidence of anemia.  Platelets within range.  Hyponatremia of 113.  Hypochloremia of 81.  No transaminitis.  No renal dysfunction.  Magnesium , phosphorus within normal limits.  Lipase within normal limits.   Imaging Studies ordered:  I ordered imaging studies including chest x-ray I independently visualized and interpreted imaging which showed no acute cardiopulmonary abnormality I agree with the radiologist interpretation   Cardiac Monitoring: / EKG:  The patient was maintained on a cardiac monitor.  I personally viewed and interpreted the cardiac monitored which showed an underlying rhythm of: Sinus rhythm.  T wave inversion V1.   Consultations Obtained:  N/a   Problem List / ED Course / Critical interventions / Medication management  Hyponatremia, chest pain I ordered medication including GI cocktail, Ativan , 1 L normal saline  Reevaluation of the patient after these medicines showed that the  patient improved I have reviewed the patients home medicines and have made adjustments as needed   Social Determinants of Health:  Chronic cigarette use.  Alcohol abuse.  Polysubstance use.   Test / Admission - Considered:  Hyponatremia, chest pain Vitals signs significant for hypertension blood pressure 155/86. Otherwise within normal range and stable  throughout visit. Laboratory/imaging studies significant for: See above 58 year old female presents emergency department with complaints of chest pain, shortness of breath, headache.  States that she has been dealing with headache for the past 3 days or so.  Reports pain in the top of her head.  States this is typical of prior headaches that she has had in the past.  Has taken Tylenol  without significant improvement of symptoms.  Denies any visual disturbance, gait abnormality, slurred speech, facial droop, weakness/sensory deficit of lower extremities.  Also reporting chest pain.  States this began this morning around 9 AM when she woke up.  States she initially felt like her heart rate was up and subsequently felt increasingly anxious.  Then reports central chest pain described as pressure/crushing.  States that symptoms have been constant since onset.  Denies exertional worsening of symptoms, worsening symptoms with consumption of food/liquids.  Does states that pain is somewhat worsened when she lies on her back.  States that she is also having pain in her upper right back.  States that she has had this pain for "years and years."  States that this pain is worsened with movement and relieved with rest.  Has any fevers, chills, cough, nausea, vomiting, urinary symptoms.  Patient reports chronic alcohol use. Patient states that she has had chronic diarrhea for years.  Reports increasing diarrhea over the past couple of weeks with 4-5 episodes daily.  Denies melena, hematochezia.  States that she drinks 4-5 12 to 16 ounce containers of wine mixed with  club soda daily. On exam, abdomen nontender.  Workup concerning for significant hyponatremia of 113.  Patient with history of hyponatremia due to beer [place] mania as well as collagenous colitis.  Patient still with chronic alcohol abuse.  Suspect this is most likely etiology of patient's abnormal lab.  Treated with IV normal saline.  Pending repeat metabolic panel.  Regarding chest pain, negative troponin, lack of acute ischemic change on EKG; low suspicion for ACS.  Improvement with GI cocktail.  Patient did have dissection study about a month ago for the same presentation without any acute abnormality.  Suspect GERD as cause given improvement with GI cocktail.  Will pursue admission given patient's significant hyponatremia.  Treatment plan discussed with patient and she knowledge understanding was agreeable to said plan.  Patient stable upon admission.         Final Clinical Impression(s) / ED Diagnoses Final diagnoses:  None    Rx / DC Orders ED Discharge Orders     None         [place] Butter, [place] 08/06/23 1901    Arvilla Birmingham, MD 08/06/23 [phone]

## 2023-08-06 NOTE — ED Notes (Signed)
 ED Provider at bedside.

## 2023-08-06 NOTE — ED Notes (Signed)
 [facility] called for transport no current ETA ED Nurse aware and has paper work ready Called @ 21:40

## 2023-08-07 LAB — OSMOLALITY: Osmolality: 256 mosm/kg — ABNORMAL LOW (ref 275–295)

## 2023-08-17 ENCOUNTER — Encounter (HOSPITAL_BASED_OUTPATIENT_CLINIC_OR_DEPARTMENT_OTHER): Payer: Self-pay

## 2023-08-17 ENCOUNTER — Other Ambulatory Visit: Payer: Self-pay

## 2023-08-17 ENCOUNTER — Emergency Department (HOSPITAL_BASED_OUTPATIENT_CLINIC_OR_DEPARTMENT_OTHER)

## 2023-08-17 ENCOUNTER — Observation Stay (HOSPITAL_BASED_OUTPATIENT_CLINIC_OR_DEPARTMENT_OTHER)
Admission: EM | Admit: 2023-08-17 | Discharge: 2023-08-18 | Disposition: A | Discharge status: Home / Self Care | Attending: Family Medicine | Admitting: Family Medicine

## 2023-08-17 DIAGNOSIS — Z79899 Other long term (current) drug therapy: Secondary | ICD-10-CM | POA: Insufficient documentation

## 2023-08-17 DIAGNOSIS — F17210 Nicotine dependence, cigarettes, uncomplicated: Secondary | ICD-10-CM | POA: Insufficient documentation

## 2023-08-17 DIAGNOSIS — F32A Depression, unspecified: Secondary | ICD-10-CM | POA: Diagnosis not present

## 2023-08-17 DIAGNOSIS — F1010 Alcohol abuse, uncomplicated: Secondary | ICD-10-CM | POA: Insufficient documentation

## 2023-08-17 DIAGNOSIS — F419 Anxiety disorder, unspecified: Secondary | ICD-10-CM | POA: Diagnosis present

## 2023-08-17 DIAGNOSIS — R531 Weakness: Secondary | ICD-10-CM | POA: Diagnosis present

## 2023-08-17 DIAGNOSIS — E871 Hypo-osmolality and hyponatremia: Secondary | ICD-10-CM | POA: Diagnosis not present

## 2023-08-17 DIAGNOSIS — Z7902 Long term (current) use of antithrombotics/antiplatelets: Secondary | ICD-10-CM | POA: Insufficient documentation

## 2023-08-17 DIAGNOSIS — E876 Hypokalemia: Secondary | ICD-10-CM | POA: Insufficient documentation

## 2023-08-17 DIAGNOSIS — I10 Essential (primary) hypertension: Secondary | ICD-10-CM | POA: Diagnosis present

## 2023-08-17 LAB — CBC
HCT: 37.7 % (ref 36.0–46.0)
Hemoglobin: 13.4 g/dL (ref 12.0–15.0)
MCH: 33 pg (ref 26.0–34.0)
MCHC: 35.5 g/dL (ref 30.0–36.0)
MCV: 92.9 fL (ref 80.0–100.0)
Platelets: 248 10*3/uL (ref 150–400)
RBC: 4.06 MIL/uL (ref 3.87–5.11)
RDW: 12.3 % (ref 11.5–15.5)
WBC: 4.9 10*3/uL (ref 4.0–10.5)
nRBC: 0 % (ref 0.0–0.2)

## 2023-08-17 LAB — COMPREHENSIVE METABOLIC PANEL WITH GFR
ALT: 28 U/L (ref 0–44)
AST: 37 U/L (ref 15–41)
Albumin: 4.9 g/dL (ref 3.5–5.0)
Alkaline Phosphatase: 93 U/L (ref 38–126)
Anion gap: 16 — ABNORMAL HIGH (ref 5–15)
BUN: 6 mg/dL (ref 6–20)
CO2: 21 mmol/L — ABNORMAL LOW (ref 22–32)
Calcium: 9.8 mg/dL (ref 8.9–10.3)
Chloride: 83 mmol/L — ABNORMAL LOW (ref 98–111)
Creatinine, Ser: 0.74 mg/dL (ref 0.44–1.00)
GFR, Estimated: >60 mL/min (ref >60)
Glucose, Bld: 96 mg/dL (ref 70–99)
Potassium: 3.3 mmol/L — ABNORMAL LOW (ref 3.5–5.1)
Sodium: 121 mmol/L — ABNORMAL LOW (ref 135–145)
Total Bilirubin: 0.4 mg/dL (ref 0.0–1.2)
Total Protein: 7.9 g/dL (ref 6.5–8.1)

## 2023-08-17 LAB — URINALYSIS, ROUTINE W REFLEX MICROSCOPIC
Bilirubin Urine: NEGATIVE
Glucose, UA: NEGATIVE mg/dL
Ketones, ur: NEGATIVE mg/dL
Leukocytes,Ua: NEGATIVE
Nitrite: NEGATIVE
Protein, ur: NEGATIVE mg/dL
Specific Gravity, Urine: 1.015 (ref 1.005–1.030)
pH: 7 (ref 5.0–8.0)

## 2023-08-17 LAB — URINALYSIS, MICROSCOPIC (REFLEX): WBC, UA: NONE SEEN WBC/hpf (ref 0–5)

## 2023-08-17 LAB — LIPASE, BLOOD: Lipase: 40 U/L (ref 11–51)

## 2023-08-17 LAB — ETHANOL: Alcohol, Ethyl (B): <15 mg/dL (ref <15)

## 2023-08-17 MED ORDER — FENTANYL CITRATE PF 50 MCG/ML IJ SOSY
50.0000 ug | PREFILLED_SYRINGE | Freq: Once | INTRAMUSCULAR | Status: AC
  Administered 2023-08-17: 50 ug via INTRAVENOUS
  Filled 2023-08-17: qty 1

## 2023-08-17 MED ORDER — LORAZEPAM 2 MG/ML IJ SOLN
1.0000 mg | Freq: Once | INTRAMUSCULAR | Status: AC
  Administered 2023-08-17: 1 mg via INTRAVENOUS
  Filled 2023-08-17: qty 1

## 2023-08-17 NOTE — ED Notes (Signed)
 [facility] called for transport, No Current ETA ED Nurse has paper work [employer] @ 23:32

## 2023-08-17 NOTE — ED Provider Notes (Signed)
 [place] [facility] AT [facility] Provider Note   CSN: [id number] Arrival date & time: 08/17/23  2050     History Hyponatremia, alcohol abuse Chief Complaint  Patient presents with   Headache   Emesis    Tiffany [name] is a 58 y.o. female.  58 y.o female with a PMH of hyponatremia presents to the ED with a chief complaint of weakness, reports she was supposed to be admitted approximately a week and a half ago, however had to leave AGAINST MEDICAL ADVICE because she was going to "be evicted ".  Today she returns that she has been having ongoing dizziness, headaches, vomiting for the past week.  When asked about more of her symptoms, she reports she has been feeling unwell, lightheaded, pressure in her head, also wanted to come back because [facility] called her and told her that her sodium had been terribly low therefore she needed to be seen.  She continues to endorse alcohol intake, states that she drink 2 cups of hard lemonade with half club soda daily, last drink was approximately 2 hours prior to arrival in the emergency department.  She reports an episode which she noted to be "dark ", has not taken any medication to stop the vomiting at this time.  The history is provided by the patient.  Headache Associated symptoms: vomiting   Associated symptoms: no abdominal pain, no back pain, no diarrhea, no fever, no nausea and no sore throat   Emesis Associated symptoms: headaches   Associated symptoms: no abdominal pain, no chills, no diarrhea, no fever and no sore throat        Home Medications Prior to Admission medications   Medication Sig Start Date End Date Taking? Authorizing Provider  atorvastatin  (LIPITOR) 40 MG tablet Take 40 mg by mouth at bedtime. 11/07/21   [provider]  BuPROPion  HCl ER, XL, 450 MG TB24 Take 450 mg by mouth daily. 06/10/14   Wray Heady, MD  carbamazepine  (TEGRETOL ) 200 MG tablet Take 200 mg by mouth 2 (two) times  daily.    [provider]  chlordiazePOXIDE  (LIBRIUM ) 25 MG capsule 50mg  PO TID x 1D, then 25-50mg  PO BID X 1D, then 25-50mg  PO QD X 1D 01/05/22   Roemhildt, Lorin T, PA-C  clopidogrel  (PLAVIX ) 75 MG tablet Take 75 mg by mouth daily. 11/08/21   [provider]  diphenhydrAMINE  (BENADRYL ) 25 MG tablet Take 2 tablets (50 mg total) by mouth at bedtime as needed for itching. 06/17/14   Wray Heady, MD  DULoxetine  (CYMBALTA ) 30 MG capsule Take 30 mg by mouth daily. 11/01/22   [provider]  fluticasone  (FLONASE ) 50 MCG/ACT nasal spray Place 2 sprays into both nostrils daily as needed for allergies.    [provider]  gabapentin  (NEURONTIN ) 600 MG tablet Take 600 mg by mouth 3 (three) times daily. 08/20/19   [provider]  hydrOXYzine  (VISTARIL ) 50 MG capsule Take 50 mg by mouth at bedtime. 11/02/22   [provider]  levocetirizine (XYZAL ) 5 MG tablet Take 5 mg by mouth daily. 07/12/22   [provider]  lidocaine  (LIDODERM ) 5 % Place 1 patch onto the skin daily. Remove & Discard patch within 12 hours or as directed by MD 10/19/21   Edson Graces, MD  lisinopril  (ZESTRIL ) 10 MG tablet Take 10 mg by mouth daily. 09/09/22   [provider]  LORazepam  (ATIVAN ) 1 MG tablet Take 1 mg by mouth 3 (three) times daily as  needed for anxiety.    [provider]  lurasidone  (LATUDA ) 40 MG TABS tablet Take 1 tablet (40 mg total) by mouth 2 (two) times daily. 06/17/14   Wray Heady, MD  methocarbamol  (ROBAXIN ) 500 MG tablet Take 1 tablet (500 mg total) by mouth every 8 (eight) hours as needed for muscle spasms. 10/19/21   Edson Graces, MD  methocarbamol  (ROBAXIN ) 750 MG tablet Take 750 mg by mouth 3 (three) times daily. 01/06/15   [provider]  metoprolol  tartrate (LOPRESSOR ) 50 MG tablet Take 50 mg by mouth 2 (two) times daily. 09/19/22   [provider]  naltrexone  (DEPADE) 50 MG tablet Take 50 mg by mouth daily as  needed (cravings). 08/20/19   [provider]  naproxen  (NAPROSYN ) 500 MG tablet Take 500 mg by mouth 2 (two) times daily with a meal.    [provider]  omeprazole  (PRILOSEC) 40 MG capsule Take 40 mg by mouth daily.    [provider]  ondansetron  (ZOFRAN ) 8 MG tablet Take 8 mg by mouth every 8 (eight) hours as needed for nausea or vomiting. 11/02/22   [provider]  risperiDONE  (RISPERDAL ) 1 MG tablet Take 2 mg by mouth at bedtime. 08/20/19   [provider]  sodium chloride  1 g tablet Take 1 tablet (1 g total) by mouth daily for 14 days. 08/06/23 08/20/23  Arvilla Birmingham, MD  traZODone  (DESYREL ) 150 MG tablet Take 150 mg by mouth at bedtime.    [provider]  VYVANSE 70 MG capsule Take 70 mg by mouth daily. 11/03/22   [provider]      Allergies    Sulfa antibiotics, Cephalexin , and Amoxicillin-pot clavulanate    Review of Systems   Review of Systems  Constitutional:  Negative for chills and fever.  HENT:  Negative for sore throat.   Respiratory:  Negative for shortness of breath.   Cardiovascular:  Negative for chest pain.  Gastrointestinal:  Positive for vomiting. Negative for abdominal pain, diarrhea and nausea.  Genitourinary:  Negative for flank pain.  Musculoskeletal:  Negative for back pain.  Neurological:  Positive for headaches.  All other systems reviewed and are negative.   Physical Exam Updated Vital Signs BP (!) 150/90   Pulse 77   Temp 98 F (36.7 C) (Oral)   Resp 19   Wt 68 kg   SpO2 95%   BMI 27.42 kg/m  Physical Exam Vitals and nursing note reviewed.  Constitutional:      Appearance: She is well-developed.     Comments: In no apparent distress, on her phone upon entering the room.  HENT:     Head: Normocephalic and atraumatic.  Cardiovascular:     Rate and Rhythm: Normal rate.  Pulmonary:     Effort: Pulmonary effort is normal.     Breath sounds: No wheezing or rales.  Abdominal:      Palpations: Abdomen is soft.     Tenderness: There is no abdominal tenderness.     Comments: No pain with palpation over abdomen, no guarding.  Musculoskeletal:     Cervical back: Normal range of motion and neck supple.  Skin:    General: Skin is warm and dry.  Neurological:     Mental Status: She is alert and oriented to person, place, and time.     ED Results / Procedures / Treatments   Labs (all labs ordered are listed, but only abnormal results are displayed) Labs Reviewed  COMPREHENSIVE  METABOLIC PANEL WITH GFR - Abnormal; Notable for the following components:      Result Value   Sodium 121 (*)    Potassium 3.3 (*)    Chloride 83 (*)    CO2 21 (*)    Anion gap 16 (*)    All other components within normal limits  URINALYSIS, ROUTINE W REFLEX MICROSCOPIC - Abnormal; Notable for the following components:   Hgb urine dipstick TRACE (*)    All other components within normal limits  URINALYSIS, MICROSCOPIC (REFLEX) - Abnormal; Notable for the following components:   Bacteria, UA RARE (*)    All other components within normal limits  LIPASE, BLOOD  CBC  ETHANOL  OSMOLALITY    EKG None  Radiology DG Chest Portable 1 View Result Date: 08/17/2023 CLINICAL DATA:  Dizziness, headache, nausea and vomiting. EXAM: PORTABLE CHEST 1 VIEW COMPARISON:  PA Lat chest 08/06/2023 FINDINGS: Low inspiration compared with the recent study. Bronchovascular crowding. Increased interstitial prominence could be due to low lung volumes or interstitial pneumonitis. The cardiomediastinal silhouette and central vasculature appear normal. There is no substantial pleural effusion.  No focal dense opacity. Levoscoliosis and mild degenerative change thoracic spine. New osseous findings. IMPRESSION: Low inspiration with bronchovascular crowding. Increased interstitial prominence could be due to low lung volumes or interstitial pneumonitis. Follow-up study is suggested in full inspiration. Electronically Signed    By: Denman Fischer M.D.   On: 08/17/2023 22:38    Procedures Procedures    Medications Ordered in ED Medications  LORazepam  (ATIVAN ) injection 1 mg (1 mg Intravenous Given 08/17/23 2337)  fentaNYL  (SUBLIMAZE ) injection 50 mcg (50 mcg Intravenous Given 08/17/23 2346)    ED Course/ Medical Decision Making/ A&P                                 Medical Decision Making Amount and/or Complexity of Data Reviewed Labs: ordered. Radiology: ordered.  Risk Prescription drug management. Decision regarding hospitalization.   This patient presents to the ED for concern of weakness, this involves a number of treatment options, and is a complaint that carries with it a high risk of complications and morbidity.  The differential diagnosis includes electrolyte derangement, infection versus    Co morbidities: Discussed in HPI   Brief History:  See HPI.   EMR reviewed including pt PMHx, past surgical history and past visits to ER.   See HPI for more details   Lab Tests:  I ordered and independently interpreted labs.  The pertinent results include:    CBC with no leukocytosis, hemoglobin is within normal limits.  CMP remarkable for significant hyponatremia with a sodium of 121, potassium remarkable for some mild hypokalemia. Creatinine is normal.  LFTs are within normal limits.  UA without nitrites, or leukocytes.  Lipase level is normal.   Imaging Studies:  Chest xray showed: IMPRESSION:  Low inspiration with bronchovascular crowding. Increased  interstitial prominence could be due to low lung volumes or  interstitial pneumonitis. Follow-up study is suggested in full  inspiration.   Cardiac Monitoring:  The patient was maintained on a cardiac monitor.  I personally viewed and interpreted the cardiac monitored which showed an underlying rhythm of: EKG non-ischemic   Medicines ordered:  I ordered medication including ativan   for anxiety Reevaluation of the patient after  these medicines showed that the patient stayed the same I have reviewed the patients home medicines and have made adjustments as  needed  Reevaluation:  After the interventions noted above I re-evaluated patient and found that they have :stayed the same  Social Determinants of Health:  The patient's social determinants of health were a factor in the care of this patient  Problem List / ED Course:  Patient presenting to the ED with a history of weakness, feeling headache, pain all over, multiple complaints.  Evaluated in the emergency department approximate week and a half ago, was found to have a critical low sodium, she then left AGAINST MEDICAL ADVICE as she stated that she was going to be evicted.  On today's visit, she returns as she feels that her symptoms are likely worsening.  She continues to endorse alcohol intake, had a drink 2 hours prior to arrival in the emergency department.  Is also requesting Ativan , reports she is prescribed this for anxiety, has a last prescription in the month of March, for approximately 75 tablets where she reports she has "run out of ". Blood work on todays visit showed a CMP with a sodium of 121 compared to prior level, this is a chronic complaint however this is now worse today. Hypokalemia but stable.  CBC with no leukocytosis, lipase levels normal.  UA with no nitrites, leukocytes present.  Chest x-ray with some low inspiration, no fever, no cough, do not suspect infection at this time.  EKG is normal sinus rhythm. Patient with hyponatremia and general weakness, we discussed admission for further management.  I spoke to Dr. Del Favia, appreciate her assistance, will admit patient for further management.    Dispostion:  After consideration of the diagnostic results and the patients response to treatment, I feel that the patent would benefit from admission for further management of her weakness.    Portions of this note were generated with [employer]. Dictation errors may occur despite best attempts at proofreading.  Final Clinical Impression(s) / ED Diagnoses Final diagnoses:  Hyponatremia  Weakness    Rx / DC Orders ED Discharge Orders     None         Luellen Sages, PA-C 08/17/23 2351    Wynetta Heckle, MD 08/18/23 2255

## 2023-08-17 NOTE — ED Triage Notes (Signed)
 Reports dizziness, headache, nausea, emesis for 1 week  States she feels like her sodium is low.

## 2023-08-18 ENCOUNTER — Encounter (HOSPITAL_COMMUNITY): Payer: Self-pay | Admitting: Family Medicine

## 2023-08-18 DIAGNOSIS — E871 Hypo-osmolality and hyponatremia: Secondary | ICD-10-CM | POA: Diagnosis not present

## 2023-08-18 DIAGNOSIS — I10 Essential (primary) hypertension: Secondary | ICD-10-CM | POA: Diagnosis present

## 2023-08-18 LAB — BASIC METABOLIC PANEL WITH GFR
Anion gap: 12 (ref 5–15)
Anion gap: 9 (ref 5–15)
BUN: <5 mg/dL — ABNORMAL LOW (ref 6–20)
BUN: 6 mg/dL (ref 6–20)
CO2: 23 mmol/L (ref 22–32)
CO2: 23 mmol/L (ref 22–32)
Calcium: 8.9 mg/dL (ref 8.9–10.3)
Calcium: 9.4 mg/dL (ref 8.9–10.3)
Chloride: 90 mmol/L — ABNORMAL LOW (ref 98–111)
Chloride: 96 mmol/L — ABNORMAL LOW (ref 98–111)
Creatinine, Ser: 0.64 mg/dL (ref 0.44–1.00)
Creatinine, Ser: 0.73 mg/dL (ref 0.44–1.00)
GFR, Estimated: >60 mL/min (ref >60)
GFR, Estimated: >60 mL/min (ref >60)
Glucose, Bld: 96 mg/dL (ref 70–99)
Glucose, Bld: 80 mg/dL (ref 70–99)
Potassium: 3.3 mmol/L — ABNORMAL LOW (ref 3.5–5.1)
Potassium: 4.5 mmol/L (ref 3.5–5.1)
Sodium: 125 mmol/L — ABNORMAL LOW (ref 135–145)
Sodium: 128 mmol/L — ABNORMAL LOW (ref 135–145)

## 2023-08-18 LAB — CBC
HCT: 36.6 % (ref 36.0–46.0)
Hemoglobin: 13.1 g/dL (ref 12.0–15.0)
MCH: 33.1 pg (ref 26.0–34.0)
MCHC: 35.8 g/dL (ref 30.0–36.0)
MCV: 92.4 fL (ref 80.0–100.0)
Platelets: 232 10*3/uL (ref 150–400)
RBC: 3.96 MIL/uL (ref 3.87–5.11)
RDW: 12.1 % (ref 11.5–15.5)
WBC: 4.3 10*3/uL (ref 4.0–10.5)
nRBC: 0 % (ref 0.0–0.2)

## 2023-08-18 LAB — OSMOLALITY: Osmolality: 250 mosm/kg — ABNORMAL LOW (ref 275–295)

## 2023-08-18 LAB — PHOSPHORUS: Phosphorus: 3.1 mg/dL (ref 2.5–4.6)

## 2023-08-18 MED ORDER — HYDROXYZINE HCL 25 MG PO TABS
50.0000 mg | ORAL_TABLET | Freq: Every day | ORAL | Status: DC
  Administered 2023-08-18: 50 mg via ORAL
  Filled 2023-08-18: qty 2

## 2023-08-18 MED ORDER — POTASSIUM CHLORIDE CRYS ER 20 MEQ PO TBCR
40.0000 meq | EXTENDED_RELEASE_TABLET | ORAL | Status: AC
Start: 2023-08-18 — End: 2023-08-18
  Administered 2023-08-18 (×2): 40 meq via ORAL
  Filled 2023-08-18 (×2): qty 2

## 2023-08-18 MED ORDER — THIAMINE MONONITRATE 100 MG PO TABS
100.0000 mg | ORAL_TABLET | Freq: Every day | ORAL | Status: DC
  Administered 2023-08-18: 100 mg via ORAL
  Filled 2023-08-18: qty 1

## 2023-08-18 MED ORDER — ONDANSETRON HCL 4 MG PO TABS: 4.0000 mg | ORAL_TABLET | Freq: Four times a day (QID) | ORAL | Status: DC | PRN

## 2023-08-18 MED ORDER — LISINOPRIL 10 MG PO TABS
20.0000 mg | ORAL_TABLET | Freq: Every day | ORAL | Status: DC
  Administered 2023-08-18: 20 mg via ORAL
  Filled 2023-08-18: qty 2

## 2023-08-18 MED ORDER — POTASSIUM CHLORIDE CRYS ER 10 MEQ PO TBCR
10.0000 meq | EXTENDED_RELEASE_TABLET | Freq: Once | ORAL | Status: AC
  Administered 2023-08-18: 10 meq via ORAL
  Filled 2023-08-18: qty 1

## 2023-08-18 MED ORDER — TRAZODONE HCL 50 MG PO TABS: 150.0000 mg | ORAL_TABLET | Freq: Every day | ORAL | Status: DC

## 2023-08-18 MED ORDER — ADULT MULTIVITAMIN W/MINERALS CH
1.0000 | ORAL_TABLET | Freq: Every day | ORAL | Status: DC
  Administered 2023-08-18: 1 via ORAL
  Filled 2023-08-18: qty 1

## 2023-08-18 MED ORDER — LORAZEPAM 1 MG PO TABS: 0.0000 mg | ORAL_TABLET | Freq: Two times a day (BID) | ORAL | Status: DC

## 2023-08-18 MED ORDER — LORAZEPAM 1 MG PO TABS
0.0000 mg | ORAL_TABLET | Freq: Four times a day (QID) | ORAL | Status: DC
  Administered 2023-08-18 (×2): 1 mg via ORAL
  Filled 2023-08-18: qty 1

## 2023-08-18 MED ORDER — ATORVASTATIN CALCIUM 40 MG PO TABS
40.0000 mg | ORAL_TABLET | Freq: Every day | ORAL | Status: DC
  Administered 2023-08-18: 40 mg via ORAL
  Filled 2023-08-18: qty 1

## 2023-08-18 MED ORDER — GABAPENTIN 300 MG PO CAPS
600.0000 mg | ORAL_CAPSULE | Freq: Three times a day (TID) | ORAL | Status: DC
  Administered 2023-08-18 (×3): 600 mg via ORAL
  Filled 2023-08-18 (×3): qty 2

## 2023-08-18 MED ORDER — ACETAMINOPHEN 325 MG PO TABS: 650.0000 mg | ORAL_TABLET | Freq: Four times a day (QID) | ORAL | Status: DC | PRN

## 2023-08-18 MED ORDER — OXYCODONE HCL 5 MG PO TABS
5.0000 mg | ORAL_TABLET | ORAL | Status: DC | PRN
  Administered 2023-08-18 (×2): 5 mg via ORAL
  Filled 2023-08-18 (×2): qty 1

## 2023-08-18 MED ORDER — METOPROLOL TARTRATE 50 MG PO TABS
50.0000 mg | ORAL_TABLET | Freq: Two times a day (BID) | ORAL | Status: DC
  Administered 2023-08-18 (×2): 50 mg via ORAL
  Filled 2023-08-18 (×2): qty 1

## 2023-08-18 MED ORDER — DULOXETINE HCL 60 MG PO CPEP
120.0000 mg | ORAL_CAPSULE | Freq: Every day | ORAL | Status: DC
  Administered 2023-08-18: 120 mg via ORAL
  Filled 2023-08-18: qty 2

## 2023-08-18 MED ORDER — ONDANSETRON HCL 4 MG/2ML IJ SOLN: 4.0000 mg | Freq: Four times a day (QID) | INTRAMUSCULAR | Status: DC | PRN

## 2023-08-18 MED ORDER — CLOPIDOGREL BISULFATE 75 MG PO TABS
75.0000 mg | ORAL_TABLET | Freq: Every day | ORAL | Status: DC
  Administered 2023-08-18: 75 mg via ORAL
  Filled 2023-08-18: qty 1

## 2023-08-18 MED ORDER — METHOCARBAMOL 750 MG PO TABS
750.0000 mg | ORAL_TABLET | Freq: Three times a day (TID) | ORAL | Status: DC | PRN
  Administered 2023-08-18: 750 mg via ORAL
  Filled 2023-08-18: qty 1

## 2023-08-18 MED ORDER — RISPERIDONE 2 MG PO TABS
1.0000 mg | ORAL_TABLET | Freq: Two times a day (BID) | ORAL | Status: DC
  Administered 2023-08-18: 1 mg via ORAL
  Filled 2023-08-18 (×2): qty 0.5

## 2023-08-18 MED ORDER — LISDEXAMFETAMINE DIMESYLATE 70 MG PO CAPS: 70.0000 mg | ORAL_CAPSULE | Freq: Every day | ORAL | Status: DC

## 2023-08-18 MED ORDER — CARBAMAZEPINE 200 MG PO TABS: 200.0000 mg | ORAL_TABLET | Freq: Two times a day (BID) | ORAL | Status: DC

## 2023-08-18 MED ORDER — LORAZEPAM 1 MG PO TABS: 1.0000 mg | ORAL_TABLET | Freq: Three times a day (TID) | ORAL | Status: DC | PRN

## 2023-08-18 MED ORDER — THIAMINE HCL 100 MG/ML IJ SOLN: 100.0000 mg | Freq: Every day | INTRAMUSCULAR | Status: DC

## 2023-08-18 MED ORDER — NICOTINE 21 MG/24HR TD PT24
21.0000 mg | MEDICATED_PATCH | Freq: Every day | TRANSDERMAL | Status: DC
  Administered 2023-08-18: 21 mg via TRANSDERMAL
  Filled 2023-08-18: qty 1

## 2023-08-18 MED ORDER — FOLIC ACID 1 MG PO TABS
1.0000 mg | ORAL_TABLET | Freq: Every day | ORAL | Status: DC
  Administered 2023-08-18: 1 mg via ORAL
  Filled 2023-08-18: qty 1

## 2023-08-18 MED ORDER — POTASSIUM CHLORIDE IN NACL 20-0.9 MEQ/L-% IV SOLN
INTRAVENOUS | Status: AC
  Filled 2023-08-18: qty 1000

## 2023-08-18 MED ORDER — LORAZEPAM 1 MG PO TABS: 0.0000 mg | ORAL_TABLET | ORAL | Status: DC | PRN

## 2023-08-18 MED ORDER — ACETAMINOPHEN 650 MG RE SUPP: 650.0000 mg | Freq: Four times a day (QID) | RECTAL | Status: DC | PRN

## 2023-08-18 MED ORDER — SODIUM CHLORIDE 0.9% FLUSH
3.0000 mL | Freq: Two times a day (BID) | INTRAVENOUS | Status: DC
  Administered 2023-08-18 (×2): 3 mL via INTRAVENOUS

## 2023-08-18 MED ORDER — ENOXAPARIN SODIUM 40 MG/0.4ML IJ SOSY
40.0000 mg | PREFILLED_SYRINGE | Freq: Every day | INTRAMUSCULAR | Status: DC
  Administered 2023-08-18: 40 mg via SUBCUTANEOUS
  Filled 2023-08-18: qty 0.4

## 2023-08-18 MED ORDER — LORAZEPAM 1 MG PO TABS
1.0000 mg | ORAL_TABLET | ORAL | Status: DC | PRN
  Filled 2023-08-18: qty 1

## 2023-08-18 MED ORDER — PANTOPRAZOLE SODIUM 40 MG PO TBEC
40.0000 mg | DELAYED_RELEASE_TABLET | Freq: Every day | ORAL | Status: DC
  Administered 2023-08-18: 40 mg via ORAL
  Filled 2023-08-18: qty 1

## 2023-08-18 MED ORDER — BUPROPION HCL ER (XL) 450 MG PO TB24: 450.0000 mg | ORAL_TABLET | Freq: Every day | ORAL | Status: DC

## 2023-08-18 NOTE — H&P (Signed)
 History and Physical    LUV CAIRES WUJ:[id number] DOB: 1965-07-10 DOA: 08/17/2023  PCP: Otis Blocker, PA   Patient coming from: Home   Chief Complaint: Lightheaded, headache, N/V   HPI: Tiffany [name] is a 58 y.o. female with medical history significant for depression, anxiety, hypertension, alcohol abuse, and history of hyponatremia who presents to the emergency department with nausea, vomiting, lightheadedness, and headache.  Patient reports developing nausea, vomiting, and loose stools 3 days ago.  Her most recent stool was formed, she continues to have nausea, but has not vomited in the past day.  She has also developed lightheadedness and mild headache without any change in vision, focal numbness, or focal weakness.  [facility] ED Course: Upon arrival to the ED, patient is found to be afebrile and saturating well on room air with normal heart rate and elevated blood pressure.  Labs are most notable for sodium 121, potassium 3.3, normal creatinine, normal WBC, and undetectable ethanol.  Patient was treated with fentanyl  and Ativan  in the ED and she was transferred to [facility] for admission.  Review of Systems:  All other systems reviewed and apart from HPI, are negative.  Past Medical History:  Diagnosis Date   Alcohol abuse    Anxiety    Anxiety    Auditory hallucination    Bipolar disorder (HCC)    Depression    GERD (gastroesophageal reflux disease)    H/O: eczema    Hallucination, visual    Hepatitis C carrier (HCC)    Herpes simplex virus infection    history of   Hypertension    Psychiatric hospitalization    Schizophrenia (HCC)    Tobacco abuse    Tubal ectopic pregnancy     Past Surgical History:  Procedure Laterality Date   elective abortion  1991   LIVER BIOPSY  1998   partial left salpingoectomy  05/07/2004   with resection of ectopic pregnancy   WISDOM TOOTH EXTRACTION      Social History:   reports that she has been smoking  cigarettes. She has a 30 pack-year smoking history. She has never used smokeless tobacco. She reports current alcohol use of about 3.0 standard drinks of alcohol per week. She reports current drug use. Drug: Marijuana.  Allergies  Allergen Reactions   Sulfa Antibiotics Nausea And Vomiting   Cephalexin  Nausea Only   Amoxicillin-Pot Clavulanate Rash    Family History  Problem Relation Age of Onset   COPD Father    Alcohol abuse Father    Anxiety disorder Mother    Hypertension Other      Prior to Admission medications   Medication Sig Start Date End Date Taking? Authorizing Provider  atorvastatin  (LIPITOR) 40 MG tablet Take 40 mg by mouth at bedtime. 11/07/21   [provider]  BuPROPion  HCl ER, XL, 450 MG TB24 Take 450 mg by mouth daily. 06/10/14   Wray Heady, MD  carbamazepine  (TEGRETOL ) 200 MG tablet Take 200 mg by mouth 2 (two) times daily.    [provider]  chlordiazePOXIDE  (LIBRIUM ) 25 MG capsule 50mg  PO TID x 1D, then 25-50mg  PO BID X 1D, then 25-50mg  PO QD X 1D 01/05/22   Roemhildt, Lorin T, PA-C  clopidogrel  (PLAVIX ) 75 MG tablet Take 75 mg by mouth daily. 11/08/21   [provider]  diphenhydrAMINE  (BENADRYL ) 25 MG tablet Take 2 tablets (50 mg total) by mouth at bedtime as needed for itching. 06/17/14   Wray Heady, MD  DULoxetine  (  CYMBALTA ) 30 MG capsule Take 30 mg by mouth daily. 11/01/22   [provider]  fluticasone  (FLONASE ) 50 MCG/ACT nasal spray Place 2 sprays into both nostrils daily as needed for allergies.    [provider]  gabapentin  (NEURONTIN ) 600 MG tablet Take 600 mg by mouth 3 (three) times daily. 08/20/19   [provider]  hydrOXYzine  (VISTARIL ) 50 MG capsule Take 50 mg by mouth at bedtime. 11/02/22   [provider]  levocetirizine (XYZAL ) 5 MG tablet Take 5 mg by mouth daily. 07/12/22   [provider]  lidocaine  (LIDODERM ) 5 % Place 1 patch onto the skin daily. Remove & Discard patch  within 12 hours or as directed by MD 10/19/21   Edson Graces, MD  lisinopril  (ZESTRIL ) 10 MG tablet Take 10 mg by mouth daily. 09/09/22   [provider]  LORazepam  (ATIVAN ) 1 MG tablet Take 1 mg by mouth 3 (three) times daily as needed for anxiety.    [provider]  lurasidone  (LATUDA ) 40 MG TABS tablet Take 1 tablet (40 mg total) by mouth 2 (two) times daily. 06/17/14   Wray Heady, MD  methocarbamol  (ROBAXIN ) 500 MG tablet Take 1 tablet (500 mg total) by mouth every 8 (eight) hours as needed for muscle spasms. 10/19/21   Edson Graces, MD  methocarbamol  (ROBAXIN ) 750 MG tablet Take 750 mg by mouth 3 (three) times daily. 01/06/15   [provider]  metoprolol  tartrate (LOPRESSOR ) 50 MG tablet Take 50 mg by mouth 2 (two) times daily. 09/19/22   [provider]  naltrexone  (DEPADE) 50 MG tablet Take 50 mg by mouth daily as needed (cravings). 08/20/19   [provider]  naproxen  (NAPROSYN ) 500 MG tablet Take 500 mg by mouth 2 (two) times daily with a meal.    [provider]  omeprazole  (PRILOSEC) 40 MG capsule Take 40 mg by mouth daily.    [provider]  ondansetron  (ZOFRAN ) 8 MG tablet Take 8 mg by mouth every 8 (eight) hours as needed for nausea or vomiting. 11/02/22   [provider]  risperiDONE  (RISPERDAL ) 1 MG tablet Take 2 mg by mouth at bedtime. 08/20/19   [provider]  sodium chloride  1 g tablet Take 1 tablet (1 g total) by mouth daily for 14 days. 08/06/23 08/20/23  Arvilla Birmingham, MD  traZODone  (DESYREL ) 150 MG tablet Take 150 mg by mouth at bedtime.    [provider]  VYVANSE 70 MG capsule Take 70 mg by mouth daily. 11/03/22   [provider]    Physical Exam: Vitals:   08/17/23 2109 08/17/23 2300 08/17/23 2330 08/18/23 0058  BP: (!) 183/100 (!) 159/96 (!) 150/90 (!) 152/83  Pulse: 68 66 77 69  Resp: 18 17 19 18   Temp: 98 F (36.7 C)   97.6 F (36.4 C)  TempSrc: Oral   Oral  SpO2:  99% 90% 95% 97%  Weight:         Constitutional: NAD, no pallor or diaphoresis  Eyes: PERTLA, lids and conjunctivae normal ENMT: Mucous membranes are moist. Posterior pharynx clear of any exudate or lesions.   Neck: supple, no masses  Respiratory: no wheezing, no crackles. No accessory muscle use.  Cardiovascular: S1 & S2 heard, regular rate and rhythm. No extremity edema.  Abdomen: Soft, no guarding. Bowel sounds active.  Musculoskeletal: no clubbing / cyanosis. No joint deformity upper and lower extremities.   Skin: no significant rashes, lesions, ulcers. Warm, dry,  well-perfused. Neurologic: CN 2-12 grossly intact. Strength 5/5 in all 4 limbs. Alert and oriented to person, place, and situation.  Psychiatric: Calm. Cooperative.    Labs and Imaging on Admission: I have personally reviewed following labs and imaging studies  CBC: Recent Labs  Lab 08/17/23 2112  WBC 4.9  HGB 13.4  HCT 37.7  MCV 92.9  PLT 248   Basic Metabolic Panel: Recent Labs  Lab 08/17/23 2112  NA 121*  K 3.3*  CL 83*  CO2 21*  GLUCOSE 96  BUN 6  CREATININE 0.74  CALCIUM  9.8   GFR: Estimated Creatinine Clearance: 71 mL/min (by C-G formula based on SCr of 0.74 mg/dL). Liver Function Tests: Recent Labs  Lab 08/17/23 2112  AST 37  ALT 28  ALKPHOS 93  BILITOT 0.4  PROT 7.9  ALBUMIN 4.9   Recent Labs  Lab 08/17/23 2112  LIPASE 40   No results for input(s): "AMMONIA" in the last 168 hours. Coagulation Profile: No results for input(s): "INR", "PROTIME" in the last 168 hours. Cardiac Enzymes: No results for input(s): "CKTOTAL", "CKMB", "CKMBINDEX", "TROPONINI" in the last 168 hours. BNP (last 3 results) No results for input(s): "PROBNP" in the last 8760 hours. HbA1C: No results for input(s): "HGBA1C" in the last 72 hours. CBG: No results for input(s): "GLUCAP" in the last 168 hours. Lipid Profile: No results for input(s): "CHOL", "HDL", "LDLCALC", "TRIG", "CHOLHDL", "LDLDIRECT" in  the last 72 hours. Thyroid Function Tests: No results for input(s): "TSH", "T4TOTAL", "FREET4", "T3FREE", "THYROIDAB" in the last 72 hours. Anemia Panel: No results for input(s): "VITAMINB12", "FOLATE", "FERRITIN", "TIBC", "IRON", "RETICCTPCT" in the last 72 hours. Urine analysis:    Component Value Date/Time   COLORURINE YELLOW 08/17/2023 2112   APPEARANCEUR CLEAR 08/17/2023 2112   LABSPEC 1.015 08/17/2023 2112   PHURINE 7.0 08/17/2023 2112   GLUCOSEU NEGATIVE 08/17/2023 2112   HGBUR TRACE (A) 08/17/2023 2112   BILIRUBINUR NEGATIVE 08/17/2023 2112   KETONESUR NEGATIVE 08/17/2023 2112   PROTEINUR NEGATIVE 08/17/2023 2112   UROBILINOGEN 0.2 11/28/2013 2259   NITRITE NEGATIVE 08/17/2023 2112   LEUKOCYTESUR NEGATIVE 08/17/2023 2112   Sepsis Labs: @LABRCNTIP (procalcitonin:4,lacticidven:4) )No results found for this or any previous visit (from the past 240 hours).   Radiological Exams on Admission: DG Chest Portable 1 View Result Date: 08/17/2023 CLINICAL DATA:  Dizziness, headache, nausea and vomiting. EXAM: PORTABLE CHEST 1 VIEW COMPARISON:  PA Lat chest 08/06/2023 FINDINGS: Low inspiration compared with the recent study. Bronchovascular crowding. Increased interstitial prominence could be due to low lung volumes or interstitial pneumonitis. The cardiomediastinal silhouette and central vasculature appear normal. There is no substantial pleural effusion.  No focal dense opacity. Levoscoliosis and mild degenerative change thoracic spine. New osseous findings. IMPRESSION: Low inspiration with bronchovascular crowding. Increased interstitial prominence could be due to low lung volumes or interstitial pneumonitis. Follow-up study is suggested in full inspiration. Electronically Signed   By: Denman Fischer M.D.   On: 08/17/2023 22:38    EKG: Independently reviewed. Sinus rhythm.   Assessment/Plan   1. Hyponatremia  - Serum sodium is 121 on admission in setting of hypovolemia and low-solute  diet; no severe symptoms  - Check urine sodium and urine osm, restrict free-water  intake, start isotonic IVF, hold lisinopril  and Cymbalta  for now, follow serial chem panels    2. Alcohol abuse  - Monitor with CIWA scoring, use Ativan  if needed, supplement vitamins, consult TOC    3. Hypokalemia  - Replacing    4. Hypertension  -  Continue metoprolol      5. Depression, anxiety  - Continue Vistaril , Risperidone      DVT prophylaxis: Lovenox   Code Status: Full  Level of Care: Level of care: Telemetry Medical Family Communication: none present  Disposition Plan:  Patient is from: home  Anticipated d/c is to: Home  Anticipated d/c date is: 08/19/23  Patient currently: Pending correction of hyponatremia  Consults called: None  Admission status: Observation     Walton Guppy, MD [facility]  08/18/2023, 1:15 AM

## 2023-08-18 NOTE — Plan of Care (Signed)
  Problem: Clinical Measurements: Goal: Will remain free from infection Outcome: Progressing   Problem: Activity: Goal: Risk for activity intolerance will decrease Outcome: Progressing   Problem: Coping: Goal: Level of anxiety will decrease Outcome: Progressing   Problem: Safety: Goal: Ability to remain free from injury will improve Outcome: Progressing   

## 2023-08-18 NOTE — Progress Notes (Signed)
 Patient arrived to room [phone] from [facility].  Assessment complete, VS obtained, and Admission database began.

## 2023-08-18 NOTE — Discharge Summary (Signed)
 Physician Discharge Summary  Tiffany [name] QMV:[id number] DOB: [birth year] DOA: 08/17/2023  PCP: Otis Blocker, PA  Admit date: 08/17/2023 Discharge date: 08/18/2023    Admitted From: Home Disposition: Home  Recommendations for Outpatient Follow-up:  Follow up with PCP in 1-2 weeks Please obtain BMP/CBC in one week Please follow up with your PCP on the following pending results: Unresulted Labs (From admission, onward)     Start     Ordered   08/25/23 0500  Creatinine, serum  (enoxaparin  (LOVENOX )    CrCl >/= 30 ml/min)  Weekly,   R     Comments: while on enoxaparin  therapy    08/18/23 0114   08/18/23 0500  CBC  Daily,   R      08/18/23 0114   08/18/23 0115  Sodium, urine, random  ONCE - URGENT,   URGENT        08/18/23 0114   08/18/23 0115  Osmolality, urine  ONCE - URGENT,   URGENT        08/18/23 0114   08/18/23 0113  Basic metabolic panel  Now then every 6 hours,   R (with TIMED occurrences)      08/18/23 0114              Home Health: None Equipment/Devices: None  Discharge Condition: Stable CODE STATUS: Full code Diet recommendation: Cardiac  HPI: Tiffany [name] is a 58 y.o. female with medical history significant for depression, anxiety, hypertension, alcohol abuse, and history of hyponatremia who presents to the emergency department with nausea, vomiting, lightheadedness, and headache.   Patient reports developing nausea, vomiting, and loose stools 3 days ago.  Her most recent stool was formed, she continues to have nausea, but has not vomited in the past day.  She has also developed lightheadedness and mild headache without any change in vision, focal numbness, or focal weakness.   [facility] ED Course: Upon arrival to the ED, patient is found to be afebrile and saturating well on room air with normal heart rate and elevated blood pressure.  Labs are most notable for sodium 121, potassium 3.3, normal creatinine, normal WBC, and undetectable ethanol.   Patient  was treated with fentanyl  and Ativan  in the ED and she was transferred to [facility] for admission.  Subjective: Seen and examined.  Other than fatigue she has no other complaint.  Fatigue is slightly improved as well.  She feels comfortable going home.  Brief/Interim Summary: Patient was briefly hospitalized overnight for the following.  1.  Acute on chronic hyponatremia  - Has history of chronic hyponatremia, after extensive chart review, it appears that patient's baseline sodium remains between 125-130.  This is secondary to chronic alcoholism.she came in with sodium of 121 which improved to 125 earlier and now better.  She has no symptoms other than fatigue.  She is stable for discharge.    2. Alcohol abuse/dependence - Admits to drinking 2.5 bottles of hard lemonade.  I tried to counsel her to quit but she says  "it is hard for me, I enjoy it a lot " she starts drinking in the middle of the day and drinks through the night.  Does not drink water .  She appears to be understanding that this habit can be dangerous for her .   3. Hypokalemia  - Replaced prior to discharge.   4. Hypertension  - Blood pressure was slightly elevated earlier due to holding antihypertensives.  We are resuming all antihypertensives now.  5. Depression, anxiety  - Continue Vistaril , Risperidone     Discharge plan was discussed with patient and/or family member and they verbalized understanding and agreed with it.  Discharge Diagnoses:  Principal Problem:   Hyponatremia Active Problems:   Alcohol abuse   Hypokalemia   Depression   Anxiety   Essential hypertension    Discharge Instructions   Allergies as of 08/18/2023       Reactions   Sulfa Antibiotics Nausea And Vomiting   Cephalexin  Nausea Only   Amoxicillin-pot Clavulanate Rash        Medication List     TAKE these medications    atorvastatin  40 MG tablet Commonly known as: LIPITOR Take 40 mg by mouth at bedtime.   buPROPion   HCl ER (XL) 450 MG Tb24 Take 450 mg by mouth daily.   carbamazepine  200 MG tablet Commonly known as: TEGRETOL  Take 200 mg by mouth 2 (two) times daily.   chlordiazePOXIDE  25 MG capsule Commonly known as: LIBRIUM  50mg  PO TID x 1D, then 25-50mg  PO BID X 1D, then 25-50mg  PO QD X 1D   clopidogrel  75 MG tablet Commonly known as: PLAVIX  Take 75 mg by mouth daily.   diphenhydrAMINE  25 MG tablet Commonly known as: BENADRYL  Take 2 tablets (50 mg total) by mouth at bedtime as needed for itching.   DULoxetine  30 MG capsule Commonly known as: CYMBALTA  Take 30 mg by mouth daily.   fluticasone  50 MCG/ACT nasal spray Commonly known as: FLONASE  Place 2 sprays into both nostrils daily as needed for allergies.   gabapentin  600 MG tablet Commonly known as: NEURONTIN  Take 600 mg by mouth 3 (three) times daily.   hydrOXYzine  50 MG capsule Commonly known as: VISTARIL  Take 50 mg by mouth at bedtime.   levocetirizine 5 MG tablet Commonly known as: XYZAL  Take 5 mg by mouth daily.   lidocaine  5 % Commonly known as: Lidoderm  Place 1 patch onto the skin daily. Remove & Discard patch within 12 hours or as directed by MD   lisinopril  10 MG tablet Commonly known as: ZESTRIL  Take 10 mg by mouth daily.   LORazepam  1 MG tablet Commonly known as: ATIVAN  Take 1 mg by mouth 3 (three) times daily as needed for anxiety.   lurasidone  40 MG Tabs tablet Commonly known as: LATUDA  Take 1 tablet (40 mg total) by mouth 2 (two) times daily.   methocarbamol  750 MG tablet Commonly known as: ROBAXIN  Take 750 mg by mouth 3 (three) times daily.   methocarbamol  500 MG tablet Commonly known as: ROBAXIN  Take 1 tablet (500 mg total) by mouth every 8 (eight) hours as needed for muscle spasms.   metoprolol  tartrate 50 MG tablet Commonly known as: LOPRESSOR  Take 50 mg by mouth 2 (two) times daily.   naltrexone  50 MG tablet Commonly known as: DEPADE Take 50 mg by mouth daily as needed (cravings).    naproxen  500 MG tablet Commonly known as: NAPROSYN  Take 500 mg by mouth 2 (two) times daily with a meal.   omeprazole  40 MG capsule Commonly known as: PRILOSEC Take 40 mg by mouth daily.   ondansetron  8 MG tablet Commonly known as: ZOFRAN  Take 8 mg by mouth every 8 (eight) hours as needed for nausea or vomiting.   risperiDONE  1 MG tablet Commonly known as: RISPERDAL  Take 2 mg by mouth at bedtime.   sodium chloride  1 g tablet Take 1 tablet (1 g total) by mouth daily for 14 days.   traZODone  150 MG tablet Commonly known  as: DESYREL  Take 150 mg by mouth at bedtime.   Vyvanse 70 MG capsule Generic drug: lisdexamfetamine Take 70 mg by mouth daily.        Follow-up Information     Otis Blocker, PA Follow up in 1 week(s).   Specialty: Physician Assistant Contact information: [phone] [place] [address] [phone]                Allergies  Allergen Reactions   Sulfa Antibiotics Nausea And Vomiting   Cephalexin  Nausea Only   Amoxicillin-Pot Clavulanate Rash    Consultations: None   Procedures/Studies: DG Chest Portable 1 View Result Date: 08/17/2023 CLINICAL DATA:  Dizziness, headache, nausea and vomiting. EXAM: PORTABLE CHEST 1 VIEW COMPARISON:  PA Lat chest 08/06/2023 FINDINGS: Low inspiration compared with the recent study. Bronchovascular crowding. Increased interstitial prominence could be due to low lung volumes or interstitial pneumonitis. The cardiomediastinal silhouette and central vasculature appear normal. There is no substantial pleural effusion.  No focal dense opacity. Levoscoliosis and mild degenerative change thoracic spine. New osseous findings. IMPRESSION: Low inspiration with bronchovascular crowding. Increased interstitial prominence could be due to low lung volumes or interstitial pneumonitis. Follow-up study is suggested in full inspiration. Electronically Signed   By: Denman Fischer M.D.   On: 08/17/2023 22:38   DG Chest 2  View Result Date: 08/06/2023 CLINICAL DATA:  Shortness of breath and chest pressure EXAM: CHEST - 2 VIEW COMPARISON:  06/21/2023 FINDINGS: The heart size and mediastinal contours are within normal limits. Both lungs are clear. The visualized skeletal structures are unremarkable except for similar levoscoliosis of the thoracic spine and degenerative changes. IMPRESSION: No active cardiopulmonary disease. Electronically Signed   By: Melven Stable.  Shick M.D.   On: 08/06/2023 17:24     Discharge Exam: Vitals:   08/18/23 1151 08/18/23 1206  BP: (!) 155/90 (!) 165/88  Pulse:  63  Resp:  17  Temp:  98.1 F (36.7 C)  SpO2:  99%   Vitals:   08/18/23 0821 08/18/23 0834 08/18/23 1151 08/18/23 1206  BP: (!) 155/91 (!) 155/90 (!) 155/90 (!) 165/88  Pulse: 70 72  63  Resp: 17   17  Temp: 97.8 F (36.6 C)   98.1 F (36.7 C)  TempSrc: Oral   Oral  SpO2: 98%   99%  Weight:      Height:        General: Pt is alert, awake, not in acute distress Cardiovascular: RRR, S1/S2 +, no rubs, no gallops Respiratory: CTA bilaterally, no wheezing, no rhonchi Abdominal: Soft, NT, ND, bowel sounds + Extremities: no edema, no cyanosis    The results of significant diagnostics from this hospitalization (including imaging, microbiology, ancillary and laboratory) are listed below for reference.     Microbiology: No results found for this or any previous visit (from the past 240 hours).   Labs: BNP (last 3 results) No results for input(s): "BNP" in the last 8760 hours. Basic Metabolic Panel: Recent Labs  Lab 08/17/23 2112 08/18/23 0442  NA 121* 125*  K 3.3* 3.3*  CL 83* 90*  CO2 21* 23  GLUCOSE 96 96  BUN 6 <5*  CREATININE 0.74 0.64  CALCIUM  9.8 8.9  PHOS  --  3.1   Liver Function Tests: Recent Labs  Lab 08/17/23 2112  AST 37  ALT 28  ALKPHOS 93  BILITOT 0.4  PROT 7.9  ALBUMIN 4.9   Recent Labs  Lab 08/17/23 2112  LIPASE 40   No  results for input(s): "AMMONIA" in the last 168  hours. CBC: Recent Labs  Lab 08/17/23 2112 08/18/23 0442  WBC 4.9 4.3  HGB 13.4 13.1  HCT 37.7 36.6  MCV 92.9 92.4  PLT 248 232   Cardiac Enzymes: No results for input(s): "CKTOTAL", "CKMB", "CKMBINDEX", "TROPONINI" in the last 168 hours. BNP: Invalid input(s): "POCBNP" CBG: No results for input(s): "GLUCAP" in the last 168 hours. D-Dimer No results for input(s): "DDIMER" in the last 72 hours. Hgb A1c No results for input(s): "HGBA1C" in the last 72 hours. Lipid Profile No results for input(s): "CHOL", "HDL", "LDLCALC", "TRIG", "CHOLHDL", "LDLDIRECT" in the last 72 hours. Thyroid function studies No results for input(s): "TSH", "T4TOTAL", "T3FREE", "THYROIDAB" in the last 72 hours.  Invalid input(s): "FREET3" Anemia work up No results for input(s): "VITAMINB12", "FOLATE", "FERRITIN", "TIBC", "IRON", "RETICCTPCT" in the last 72 hours. Urinalysis    Component Value Date/Time   COLORURINE YELLOW 08/17/2023 2112   APPEARANCEUR CLEAR 08/17/2023 2112   LABSPEC 1.015 08/17/2023 2112   PHURINE 7.0 08/17/2023 2112   GLUCOSEU NEGATIVE 08/17/2023 2112   HGBUR TRACE (A) 08/17/2023 2112   BILIRUBINUR NEGATIVE 08/17/2023 2112   KETONESUR NEGATIVE 08/17/2023 2112   PROTEINUR NEGATIVE 08/17/2023 2112   UROBILINOGEN 0.2 11/28/2013 2259   NITRITE NEGATIVE 08/17/2023 2112   LEUKOCYTESUR NEGATIVE 08/17/2023 2112   Sepsis Labs Recent Labs  Lab 08/17/23 2112 08/18/23 0442  WBC 4.9 4.3   Microbiology No results found for this or any previous visit (from the past 240 hours).  FURTHER DISCHARGE INSTRUCTIONS:   Get Medicines reviewed and adjusted: Please take all your medications with you for your next visit with your Primary MD   Laboratory/radiological data: Please request your Primary MD to go over all hospital tests and procedure/radiological results at the follow up, please ask your Primary MD to get all Hospital records sent to his/her office.   In some cases, they will be  blood work, cultures and biopsy results pending at the time of your discharge. Please request that your primary care M.D. goes through all the records of your hospital data and follows up on these results.   Also Note the following: If you experience worsening of your admission symptoms, develop shortness of breath, life threatening emergency, suicidal or homicidal thoughts you must seek medical attention immediately by calling 911 or calling your MD immediately  if symptoms less severe.   You must read complete instructions/literature along with all the possible adverse reactions/side effects for all the Medicines you take and that have been prescribed to you. Take any new Medicines after you have completely understood and accpet all the possible adverse reactions/side effects.    Do not drive when taking Pain medications or sleeping medications (Benzodaizepines)   Do not take more than prescribed Pain, Sleep and Anxiety Medications. It is not advisable to combine anxiety,sleep and pain medications without talking with your primary care practitioner   Special Instructions: If you have smoked or chewed Tobacco  in the last 2 yrs please stop smoking, stop any regular Alcohol  and or any Recreational drug use.   Wear Seat belts while driving.   Please note: You were cared for by a hospitalist during your hospital stay. Once you are discharged, your primary care physician will handle any further medical issues. Please note that NO REFILLS for any discharge medications will be authorized once you are discharged, as it is imperative that you return to your primary care physician (or establish a relationship  with a primary care physician if you do not have one) for your post hospital discharge needs so that they can reassess your need for medications and monitor your lab values  Time coordinating discharge: Over 30 minutes  SIGNED:   Modena Andes, MD  [facility] 08/18/2023, 2:07 PM *Please  note that this is a verbal dictation therefore any spelling or grammatical errors are due to the "[employer]" system interpretation. If 7PM-7AM, please contact night-coverage [email or web address]

## 2023-08-18 NOTE — Progress Notes (Signed)
 CSW met with and provided substance use tx resources. Pt did not have any questions or concerns.

## 2023-08-18 NOTE — Progress Notes (Signed)
 Reviewed AVS, patient expressed understanding of medications, MD follow up reviewed.   Removed IV, Site clean, dry and intact.  Patient states all belongings brought to the hospital at time of admission are accounted for and packed to take home.  Pt transported to Discharge lounge to wait for transportation home.

## 2024-04-05 NOTE — Discharge Summary (Signed)
 ------------------------------------------------------------------------------- Attestation signed by Charmaine Stuart Ades, MD at 04/07/2024  6:23 PM I saw and evaluated the patient on the day of discharge. I have reviewed the resident's note, and I agree with the summary of the patient's hospitalization and discharge plan as outlined below.  Pertinent information related to the patient's case, discharge plan, and follow up is outlined in the note below.  This evening, patient endorsed desire to discharge from hospital. She still displayed no s/s of withdrawal to me at roughly 84 hours since last drink, and had sodium that stabilized. Overall, felt low enough risk to be discharged to jail. Patient will NOT be prescribed benzos on discharge due to her concomitant alcohol use disorder making her very high risk for significant respiratory depression and death. Furthermore, she does have a history of mania with her Bipolar disorder and therefore SNRI was discontinued during discharge.   A total of 25 minutes of my time was spent on reviewing the patients chart including recent vitals and lab/test results, talking with and examining the patient, discussing the discharge plan with the patient and my medical team, preparing and reviewing the med rec, providing discharge instructions, and preparing discharge records.  Electronically Signed by: Charmaine Stuart Ades, MD, Attending Physician 04/07/2024 6:20 PM  -------------------------------------------------------------------------------  [facility] [place] [facility] Discharge Summary  Name: Tiffany [name] MRN: [id number] Age: 58 yrs DOB: Aug 09, 1965  Admit date: 04/04/2024 Discharge date and time: Sun 04/07/2024   Admitting Physician: Charmaine Stuart Ades, MD Discharge Physician: Charmaine Stuart Ades, MD  Admission Diagnoses:  Hyponatremia [E87.1]   Discharge Diagnoses:  Acute on Chronic Hyponatremia  Alcohol use disorder Chronic  benzodiazepine use Bipolar disorder Schizoaffective disorder  Admission Condition: poor Discharged Condition: good  Hospital Course:  For full details, please see H&P, progress notes, consult notes and ancillary notes. Briefly,  58 y.o. female with  a history of hypertension, hyperlipidemia, chronic hyponatremia, GERD, chronic hepatitis C and a psychiatric history significant for schizoaffective disorder, bipolar disorder, GAD on prescription benzo ADHD, alcohol use disorder, THC use.  She presented to the hospital in custody of the sheriffs department, with a chief complaint of anxiety and concern for alcohol withdrawal. She was found to be hyponatremic beyond her baseline chronic hyponatremia. Hospital course will be addressed in a problem based format as below.  Acute on chronic hyponatremia  Patient presented to the ED in police custody after telling the officers she would withdraw from alcohol if not brought to the ED. Labs on arrival were notable for Na 124 (baseline 127-130), however, patient was asymptomatic and at her baseline mental status. Patient did report she drinks roughly 12 beers per day and only has at maybe 1 small meal per day. 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 Patient was fluid restricted to 2L per day and encouraged to eat while inpatient. Additionally, it appears during her prior hospitalization as noted below her hydrochlorothiazide was discontinued d/t her chronic hyponatremia however patient had been getting refills of this medication and been taking it prior to admission. The hydrochlorothiazide will be held on discharge. Sodium continuously uptrended throughout the hospitalization to 133 on day of discharge.  Alcohol use disorder Chronic benzodiazepine use  Patient reported drinking roughly 12 beers a day with her last drink the  morning of presentation to the ED. Additionally, on chart review the patient was noted to be taking  PO ativan  1mg  q8h prn for anxiety and PDMP showed the patient had been getting monthly refills. Upon further chart review, the patient had a psychiatric admission from 01/06/24-01/09/24 for bipolar disorder with psychiatric features and at that time was weaned off of ativan  given her significant alcohol use and concern for patient to be on benzodiazepines with known AUD. In that discharge summary the provider stated they contacted the patient's PCP at [facility] to discontinue prescribing ativan  outpatient, however, it appears the PCP continued prescribing ativan . Patient was placed on CIWA protocol for both alcohol and benzodiazepine withdrawals, however, only triggered the CIWA protocol 1 time. Patient was weaned off of ativan  during the admission and recommended to continue abstaining from ativan  and alcohol on discharge.   Bipolar disorder Schizoaffective disorder Patient was recently hospitalized for psychiatric needs from 01/06/24-01/09/24 and at that time the following medications were discontinued: focalin, cymbalta , hydroxyzine , methocarbamol  and ativan . On arrival, patient reported she was receiving refills and still taking cymbalta , atarax  and ativan . During the hospitalization, she was continued on gabapentin , naltrexone  and risperdal  with stable mood. It is prudent patient discontinue taking cymbalta  or any other SNRI/SSRI due to concern for mania. The following medications will be discontinued on discharge: cymbalta , hydroxyzine , and ativan .   On day of discharge, patient is clinically stable with no new examination findings or acute symptoms compared to prior.  The patient was seen by the attending physician on the date of discharge and deemed stable and acceptable for discharge.  The patient's chronic medical conditions were treated accordingly per the patient's home medication regimen.  The  patient's medication reconciliation, follow-up appointments, discharge orders, instructions, and significant lab and diagnostic studies are as noted.    Discharge Follow-up Action Items: Follow up visits: PCP in 1-2 weeks Please follow up on Sodium via BMP Please do not prescribe ativan  given substance use and risk of withdrawal  Medication changes: STOP ativan  STOP cymbalta  STOP hydroxyzine  STOP HCTZ Start Folate Start nicotine  patches Robaxin  changed to TID as needed from TID scheduled Thiamine  changed to daily from daily as needed  Patient's Ordered Code Status: Full Code  Consults: IP CONSULT TO HOSPITALIST  Significant Diagnostic Studies:  CBC:  Results from last 7 days  Lab Units 04/07/24 0111 04/06/24 0131 04/05/24 0113 04/04/24 1257  WHITE BLOOD CELL COUNT 10*3/uL 5.12 4.07* 3.01* 4.15*  HEMOGLOBIN g/dL 86.4 86.1 87.9* 86.5  HEMATOCRIT % 38.0 39.4 34.9* 38.6  PLATELET COUNT 10*3/uL 272 270 242 275  , DIFF:  Results from last 7 days  Lab Units 04/04/24 1257  NEUTROPHILS RELATIVE PERCENT % 63  LYMPHOCYTES RELATIVE PERCENT % 26  MONOCYTES RELATIVE PERCENT % 10  BASOPHILS RELATIVE PERCENT % 1  EOSINOPHILS RELATIVE PERCENT % 1  NEUTROPHILS ABSOLUTE COUNT 10*3/uL 2.60  LYMPHOCYTES ABSOLUTE COUNT 10*3/uL 1.10  MONOCYTES ABSOLUTE COUNT 10*3/uL 0.40  BASOPHILS ABSOLUTE COUNT 10*3/uL 0.00  EOSINOPHILS ABSOLUTE COUNT 10*3/uL 0.00    Disposition: custody  Patient Instructions:     Medication List     START taking these medications    calcium  carbonate 500 mg (200 mg calcium ) chewable tablet Commonly known as: TUMS Chew 1 tablet (500 mg total) 3 (three) times a day as needed for indigestion or heartburn (chest pain that is localized under the sternum).   folic acid  1 mg tablet Commonly known as: FOLVITE  Take 1 tablet (1 mg total) by mouth daily.   nicotine  21 mg/24 hr patch Commonly known as: NICODERM CQ  Place 1 patch on the  skin daily. Start taking  on: April 08, 2024       CHANGE how you take these medications    methocarbamoL  750 mg tablet Commonly known as: ROBAXIN  Take 1 tablet (750 mg total) by mouth 3 (three) times a day as needed for muscle spasms. What changed:  when to take this reasons to take this   * thiamine  50 mg Tab tablet Commonly known as: VITAMIN B1 Take 2 tablets (100 mg total) by mouth daily. What changed:  when to take this reasons to take this   * thiamine  100 mg tablet Commonly known as: VITAMIN B1 Take 1 tablet (100 mg total) by mouth daily. What changed: You were already taking a medication with the same name, and this prescription was added. Make sure you understand how and when to take each.      * * This list has 2 medication(s) that are the same as other medications prescribed for you. Read the directions carefully, and ask your doctor or other care provider to review them with you.          CONTINUE taking these medications    aspirin  81 mg chewable tablet Take 81 mg by mouth Once Daily.   atorvastatin  40 mg tablet Commonly known as: LIPITOR Take 1 tablet (40 mg total) by mouth nightly.   dexmethylphenidate 5 mg Tab tablet Commonly known as: FOCALIN Take 5 mg by mouth 2 (two) times a day.   diphenhydrAMINE  25 mg capsule Commonly known as: BENADRYL  Take 25 mg by mouth daily as needed for itching.   ergocalciferol  1,250 mcg (50,000 unit) capsule Commonly known as: VITAMIN D2 Take 1 capsule by mouth every Saturday.   gabapentin  600 mg tablet Commonly known as: NEURONTIN  Take 600 mg by mouth 3 (three) times a day.   losartan  50 mg tablet Commonly known as: COZAAR  Take 50 mg by mouth daily.   magnesium  250 mg tablet Take 1 tablet by mouth daily as needed.   metoprolol  tartrate 50 mg tablet Commonly known as: LOPRESSOR  Take 50 mg by mouth 2 (two) times a day.   multivit-iron-folic acid -calcium -mins 9 mg iron-400 mcg tablet Commonly known as: THERA-M Take 1 tablet  by mouth daily as needed.   naltrexone  50 mg tablet Commonly known as: DEPADE Take 1 tablet (50 mg total) by mouth daily.   omeprazole  40 mg DR capsule Commonly known as: PriLOSEC Take 1 capsule (40 mg total) by mouth daily.   risperiDONE  1 mg tablet Commonly known as: RisperDAL  Take 1 mg by mouth 2 (two) times a day.       STOP taking these medications    DULoxetine  20 mg capsule Commonly known as: CYMBALTA    hydroCHLOROthiazide 25 mg tablet Commonly known as: HYDRODIURIL   hydrOXYzine  50 mg capsule Commonly known as: VISTARIL    LORazepam  1 mg tablet Commonly known as: ATIVAN          Where to Get Your Medications     Information about where to get these medications is not yet available   Ask your nurse or doctor about these medications atorvastatin  40 mg tablet calcium  carbonate 500 mg (200 mg calcium ) chewable tablet folic acid  1 mg tablet methocarbamoL  750 mg tablet nicotine  21 mg/24 hr patch thiamine  100 mg tablet thiamine  50 mg Tab tablet      Follow-up  No future appointments.  *Some images could not be shown.

## 2024-04-05 NOTE — Progress Notes (Signed)
 Case Management Update  Date: 04/05/2024   Time: 4:35 PM   Patient Type: Observation  Patient presented to hospital in the custody of [facility]. Per chart documentation, patient has recently been evicted from where she was living, and one potential charge may be trespassing. Patient presented to hospital with c/o alcohol withdrawal as well as c/o anxiety and ativan  needs. Per provider in CAPP rounds, patient was previously detoxed from her ativan  (in September when in [facility] unit), which was reportedly restarted by her PCP. Team plans to keep patient to complete detox and treat hyponatremia.   Per provider, patient inquiring how to obtain her belongings from her (previous) residence. Legal authorities at bedside would be best suited to advise patient on same.   Discharge planner spoke with officer upon arrival this afternoon to relieve prior officer. There is no current plan for patient to be released from custody, and discharge plan will be jail.   Will follow and assist should discharge plan change. Resources for homeless shelter, substance use, and mental health were placed in patient's physical chart. Patient is currently handcuffed to bed and would not be best served by being given paperwork directly at this time.   Case Management Coordination Status: Coordination In-Progress   Anticipated Discharge Location: Other (see comment) (jail)  Carlyon CHRISTELLA Evetta Janet

## 2024-04-05 NOTE — Care Plan (Signed)
" °  Problem: PAIN - ADULT Goal: Verbalizes/displays adequate comfort level or baseline comfort level Description: INTERVENTIONS: 1. Encourage pt to monitor pain and request assistance 2. Assess pain using appropriate pain scale 3. Administer analgesics based on type and severity of pain and evaluate response 4. Implement non-pharmacological measures as appropriate and evaluate response 5. Consider cultural and social influences on pain and pain management 6. Notify LIP if interventions unsuccessful or patient reports new pain Outcome: Progressing   Problem: Chronic Conditions and Co-Morbidities Goal: Patient's chronic conditions and co-morbidity symptoms are monitored and maintained or improved Description: INTERVENTIONS: 1. Monitor and assess patient's chronic conditions and comorbid symptoms for stability, deterioration, or improvement 2. Collaborate with multidisciplinary team to address chronic and comorbid conditions and prevent exacerbation or deterioration 3. Update acute care plan with appropriate goals if chronic or comorbid symptoms are exacerbated and prevent overall improvement and discharge Outcome: Progressing   "

## 2024-04-05 NOTE — Progress Notes (Signed)
 ------------------------------------------------------------------------------- Attestation signed by Charmaine Stuart Ades, MD at 04/05/2024  5:04 PM Attending Attestation: I saw and evaluated the patient. I have personally examined the patient and independently reviewed the important data in the patient's chart, including but not limited to vitals, lab work, tests, and imaging studies. I have rounded with my medical team and have discussed the plan of care with my medical team and the patient. I have reviewed the resident's note and agree with the resident physician's findings and plan.  Pertinent information related to the patient's case and today's plan is outlined below.   Briefly, this is a 58 YOF with history of chronic hyponatremia, alcohol use disorder, substance use disorder, schizoaffective disorder, bipolar disorder, GAD on chronic benzos, ADHD on stimulants, THC use who presented to the ED from jail for alcohol withdrawal.  Patient has been scoring quite low on CIWA protocol, and overall have low suspicion that she is withdrawing from benzos or alcohol.  Her sodium has been stable at roughly 125 which is near her baseline.  Continue to monitor on CIWA protocol and trend her sodiums. -------------------------------------------------------------------------------  General Medicine [place] [facility] Progress Note  ASSESSMENT/PLAN: Tiffany [name] is a 58 y.o. female with  a history of hypertension, hyperlipidemia, chronic hyponatremia, GERD, chronic hepatitis C and a psychiatric history significant for schizoaffective disorder, bipolar disorder, GAD on prescription benzo ADHD, alcohol use disorder, THC use.  She presented to the hospital in custody of the sheriffs department, with a chief complaint of anxiety and concern for alcohol withdrawal. She was found to be hyponatremic beyond her baseline chronic hyponatremia. She will remain inpatient throughout her withdrawal period and to ensure her  sodium is stable.      #Asymptomatic hypotonic hyponatremia, acute on chronic, POA -- Review of records indicates her baseline sodium fluctuates, ranging 127 and 130.  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 Additionally she is being prescribed Ativan  3 times daily. Also reporting 1.5 PPD smoking history --Recently in September she was admitted to the psychiatric unit, where she was weaned off of Ativan .  Providers at that time felt that she never truly withdrew from alcohol --Despite documentation that her PCP was informed of the risk of Ativan  prescription in this patient with concomitant alcohol use, for unclear reasons she has been prescribed Ativan  for the past several months -Given she will be unable to access alcohol during her time in jail, decided to assist her through withdrawal with CIWA protocol -Her last fill of her Ativan  was in early November.  If she is taking as prescribed, she would have run out of doses for prior to today.  As such, do not feel that she needs scheduled benzodiazepines at this time - Reuqired 1x dose of benzodiazepine overnight 12/18 PLAN: - CIWA protocol, with symptom triggered ativan  doses - Thiamine , folate - High dose nicotine  patch  - Monitor Mg/Phos for possible re-feeding  syndrome   #Bipolar  disorder #Schizoaffective disorder - Was discharged on gabapentin  600mg  TID, naltrexone  50mg  daily, and risperidone  1mg  BID. Fill history indicates she has regularly received these medications - She had been weaned off of focalin, Cymbalta , hydroxyzine , methocarbamol , and ativan  at time of most recent hospital stay. - She is asking for the cymbalta , atarax , and ativan   - She should not be prescribed Cymbalta  (due to history of mania with this medication). She should also not be prescribed ativan  due to her high risk of somnolence with her alcohol use PLAN: -- Continue gabapentin , naltrexone , and risperdal  while hospitalized -- Avoid cymbalta  or other SNRI or SSRI   #Hx of TIA - Patient arrived to the hospital with prescribed aspirin  and Plavix .  Upon chart review, she had a TIA in July 2023.  Per review of those records, she was only supposed to be on  DAPT for 3 months. After, she should be switched to aspirin  monotherapy - Due to unclear reasons, the patient has continued to be prescribed Plavix  - As we do not feel this is indicated, have elected to switch her to aspirin  monotherapy Plan: - STOP plavix  - Continue aspirin  81mg   #Pain - Reports generalized muscle aches and joint pains - Rib pain not reproducible on exam, low concern for acute fracture - While robaxin  can be sedating, will trial on PRN basis   Plan: - Ibuprofen /Tylenol  - Lidocaine  patch to R shoulder prn - PRN robaxin    Chronic Medical Problems: HTN- continue home metoprolol  and losartan  HLD- continue statin GERD- continue home PPI  Hospital Checklist: #DVT PPX: Not indicated - ambulatory #FEN: Adult Diet- Regular #Discharge Planning: pending stability of Na, monitoring for refeeding syndrome, and getting her through the acute withdrawal period #Ethics: Full Code   Interval History: 04/05/2024 Hospital Day: 1 Vitals overall stable, however patient reporting palpitations overnight. EKG obtained with sinus  rhythm, however with slightly widened QRS indicative of non-specific intraventricular block. No significant change from prior.   Got ativan  x1 at 1850. CIWA scores 12 @1600 , 6 @2000 .   Generalized joint pains, gave tylenol  and ibuprofen . Reporting rib pain and concern over her L ribs being flatter than her right.   Expressed frustration about her medications and despite explaining my concerns that they are not safe in combintaion with her drinking, she stated that she hasn't had problems in the past. When I stated that we would be treating her withdrawal symptomatically and that we would not be restarting the ativan , robaxin , or the Cymbalta  (due to oversedation and SIADH), she expressed frustration and stated you're a witch.  OBJECTIVE: Vital Signs: Temp:  [97.6 F (36.4 C)-98.2 F (36.8 C)] 97.9 F (36.6 C) Heart Rate:  [75-94] 75 Resp:  [17-18] 18 BP: (118-183)/(74-97) 118/74  EKG: nonspecific intraventricular block (QRS 132), borderline prolonged Qtc at 515. Regular rhythm   Physical Exam: 04/05/2024 CONSTITUTIONAL: alert and oriented x3, handcuffed at the L wrist CARDIOVASCULAR: heart sounds normal. Ribs without tenderness to palpation.  PULMONARY/CHEST WALL: breath sounds normal ABDOMINAL: soft NEUROLOGICAL: awake, alert and oriented x3; writing on papers in her bed. Well groomed PSYCH: Speech is normal tempo today. Expresses anger and frustration about her medication changes. Endorses significant anxiety, however does not appear anxious on exam. Denies auditory or visual hallucinations.   CBC:  Results from last 7 days  Lab Units 04/05/24 0113 04/04/24 1257  WHITE BLOOD CELL COUNT 10*3/uL 3.01* 4.15*  HEMOGLOBIN g/dL 87.9* 86.5  HEMATOCRIT % 34.9* 38.6  PLATELET COUNT  10*3/uL 242 275   CMP:  Results from last 7 days  Lab Units 04/05/24 0113 04/04/24 2218 04/04/24 1257  SODIUM mmol/L 125* 124* 124*  POTASSIUM mmol/L 3.7 3.5 3.8  CHLORIDE mmol/L 93* 92* 92*  CO2  mmol/L 24 24 21   BUN mg/dL 8 6* 5*  CREATININE mg/dL 9.28 9.27 9.41*  CALCIUM  mg/dL 8.9 8.9 9.1  MAGNESIUM  mg/dL 1.8*  --   --   BILIRUBIN TOTAL mg/dL  --   --  0.4  AST U/L  --   --  29  ALT U/L  --   --  20  TOTAL PROTEIN g/dL  --   --  7.5  ALBUMIN g/dL  --   --  4.3  ANION GAP mmol/L 8 8 11      Electronically signed by: Aleck Duwaine Ned, MD 04/05/2024 6:33 AM

## 2024-04-06 NOTE — Care Plan (Signed)
" °  Problem: PAIN - ADULT Goal: Verbalizes/displays adequate comfort level or baseline comfort level Description: INTERVENTIONS: 1. Encourage pt to monitor pain and request assistance 2. Assess pain using appropriate pain scale 3. Administer analgesics based on type and severity of pain and evaluate response 4. Implement non-pharmacological measures as appropriate and evaluate response 5. Consider cultural and social influences on pain and pain management 6. Notify LIP if interventions unsuccessful or patient reports new pain Outcome: Progressing   Problem: DISCHARGE PLANNING Goal: Discharge to home or other facility with appropriate resources Description: INTERVENTIONS: 1. Identify barriers to discharge w/pt and caregiver 2. Arrange for needed discharge resources and transportation as appropriate 3. Identify discharge learning needs (meds, wound care, etc) 4. Arrange for interpreters to assist at discharge as needed 5. Refer to Case Management Department for coordinating discharge planning if the patient needs post-hospital services based on physician order or complex needs related to functional status, cognitive ability or social support system Outcome: Progressing   Problem: Chronic Conditions and Co-Morbidities Goal: Patient's chronic conditions and co-morbidity symptoms are monitored and maintained or improved Description: INTERVENTIONS: 1. Monitor and assess patient's chronic conditions and comorbid symptoms for stability, deterioration, or improvement 2. Collaborate with multidisciplinary team to address chronic and comorbid conditions and prevent exacerbation or deterioration 3. Update acute care plan with appropriate goals if chronic or comorbid symptoms are exacerbated and prevent overall improvement and discharge Outcome: Progressing   "

## 2024-04-06 NOTE — Care Plan (Signed)
" °  Problem: PAIN - ADULT Goal: Verbalizes/displays adequate comfort level or baseline comfort level Description: INTERVENTIONS: 1. Encourage pt to monitor pain and request assistance 2. Assess pain using appropriate pain scale 3. Administer analgesics based on type and severity of pain and evaluate response 4. Implement non-pharmacological measures as appropriate and evaluate response 5. Consider cultural and social influences on pain and pain management 6. Notify LIP if interventions unsuccessful or patient reports new pain Outcome: Progressing   Problem: DISCHARGE PLANNING Goal: Discharge to home or other facility with appropriate resources Description: INTERVENTIONS: 1. Identify barriers to discharge w/pt and caregiver 2. Arrange for needed discharge resources and transportation as appropriate 3. Identify discharge learning needs (meds, wound care, etc) 4. Arrange for interpreters to assist at discharge as needed 5. Refer to Case Management Department for coordinating discharge planning if the patient needs post-hospital services based on physician order or complex needs related to functional status, cognitive ability or social support system Outcome: Progressing   Problem: Chronic Conditions and Co-Morbidities Goal: Patient's chronic conditions and co-morbidity symptoms are monitored and maintained or improved Description: INTERVENTIONS: 1. Monitor and assess patient's chronic conditions and comorbid symptoms for stability, deterioration, or improvement 2. Collaborate with multidisciplinary team to address chronic and comorbid conditions and prevent exacerbation or deterioration 3. Update acute care plan with appropriate goals if chronic or comorbid symptoms are exacerbated and prevent overall improvement and discharge Outcome: Progressing   Problem: Knowledge Deficit Goal: Patient/family/caregiver demonstrates understanding of disease process, treatment plan, medications, and  discharge instructions Description: Complete learning assessment and assess knowledge base. Outcome: Progressing   Problem: Compromised Skin Integrity Goal: Skin integrity is maintained or improved Description: Assess and monitor skin integrity. Identify patients at risk for skin breakdown on admission and per policy. Collaborate with interdisciplinary team and initiate plans and interventions as needed.    Outcome: Progressing Goal: Fluid and electrolyte balance are achieved/maintained Description: Assess and monitor vital signs (orthostatic vitals if applicable), fluid intake and output, urine color, labs, skin turgor, mucous membranes, jugular venous distention, edema, circumference of edematous extremities and abdominal girth, respiratory status, and mental status.  Monitor for signs and symptoms of hypovolemia (tachycardia, rapid breathing, decreased urine output, postural hypotension, confusion, syncope).  Monitor for signs and symptoms of hypervolemia (strong rapid pulse, shortness of breath, difficulty breathing lying down, crackles heard in lung fields, edema). Collaborate with interdisciplinary team and initiate plan and interventions as ordered. Outcome: Progressing Goal: Nutritional status is improving Description: Monitor and assess patient for malnutrition (ex- brittle hair, bruises, dry skin, pale skin and conjunctiva, muscle wasting, smooth red tongue, and disorientation). Collaborate with interdisciplinary team and initiate plan and interventions as ordered.  Monitor patient's weight and dietary intake as ordered or per policy. Utilize nutrition screening tool and intervene per policy. Determine patient's food preferences and provide high-protein, high-caloric foods as appropriate.  Outcome: Progressing   Problem: Urinary Incontinence Goal: Perineal skin integrity is maintained or improved Description: Assess genitourinary system, perineal skin, labs (urinalysis), and history of  incontinence to include past management, aggravating, and alleviating factors.  Collaborate with interdisciplinary team and initiate plans and interventions as needed. Outcome: Progressing   "

## 2024-04-08 ENCOUNTER — Ambulatory Visit (HOSPITAL_COMMUNITY)
Admission: EM | Admit: 2024-04-08 | Discharge: 2024-04-09 | Disposition: A | Discharge status: Other Acute Inpatient Hospital | Attending: Psychiatry | Admitting: Psychiatry

## 2024-04-08 DIAGNOSIS — Z7982 Long term (current) use of aspirin: Secondary | ICD-10-CM | POA: Insufficient documentation

## 2024-04-08 DIAGNOSIS — Z560 Unemployment, unspecified: Secondary | ICD-10-CM | POA: Diagnosis not present

## 2024-04-08 DIAGNOSIS — F909 Attention-deficit hyperactivity disorder, unspecified type: Secondary | ICD-10-CM | POA: Insufficient documentation

## 2024-04-08 DIAGNOSIS — I447 Left bundle-branch block, unspecified: Secondary | ICD-10-CM | POA: Insufficient documentation

## 2024-04-08 DIAGNOSIS — Z7902 Long term (current) use of antithrombotics/antiplatelets: Secondary | ICD-10-CM | POA: Insufficient documentation

## 2024-04-08 DIAGNOSIS — F1090 Alcohol use, unspecified, uncomplicated: Secondary | ICD-10-CM | POA: Diagnosis not present

## 2024-04-08 DIAGNOSIS — Z79899 Other long term (current) drug therapy: Secondary | ICD-10-CM | POA: Insufficient documentation

## 2024-04-08 DIAGNOSIS — Z59861 Financial insecurity, difficulty paying for utilities: Secondary | ICD-10-CM | POA: Diagnosis not present

## 2024-04-08 DIAGNOSIS — F411 Generalized anxiety disorder: Secondary | ICD-10-CM | POA: Insufficient documentation

## 2024-04-08 DIAGNOSIS — Z5900 Homelessness unspecified: Secondary | ICD-10-CM | POA: Diagnosis not present

## 2024-04-08 DIAGNOSIS — F1290 Cannabis use, unspecified, uncomplicated: Secondary | ICD-10-CM | POA: Diagnosis not present

## 2024-04-08 DIAGNOSIS — F259 Schizoaffective disorder, unspecified: Secondary | ICD-10-CM | POA: Diagnosis not present

## 2024-04-08 DIAGNOSIS — F319 Bipolar disorder, unspecified: Secondary | ICD-10-CM | POA: Insufficient documentation

## 2024-04-08 DIAGNOSIS — F1010 Alcohol abuse, uncomplicated: Secondary | ICD-10-CM | POA: Insufficient documentation

## 2024-04-08 LAB — COMPREHENSIVE METABOLIC PANEL WITH GFR
ALT: 26 U/L (ref 0–44)
AST: 36 U/L (ref 15–41)
Albumin: 4.8 g/dL (ref 3.5–5.0)
Alkaline Phosphatase: 83 U/L (ref 38–126)
Anion gap: 17 — ABNORMAL HIGH (ref 5–15)
BUN: 9 mg/dL (ref 6–20)
CO2: 20 mmol/L — ABNORMAL LOW (ref 22–32)
Calcium: 9.8 mg/dL (ref 8.9–10.3)
Chloride: 95 mmol/L — ABNORMAL LOW (ref 98–111)
Creatinine, Ser: 0.64 mg/dL (ref 0.44–1.00)
GFR, Estimated: >60 mL/min
Glucose, Bld: 71 mg/dL (ref 70–99)
Potassium: 3.9 mmol/L (ref 3.5–5.1)
Sodium: 132 mmol/L — ABNORMAL LOW (ref 135–145)
Total Bilirubin: 0.4 mg/dL (ref 0.0–1.2)
Total Protein: 7.4 g/dL (ref 6.5–8.1)

## 2024-04-08 LAB — CBC WITH DIFFERENTIAL/PLATELET
Abs Immature Granulocytes: 0.02 K/uL (ref 0.00–0.07)
Basophils Absolute: 0 K/uL (ref 0.0–0.1)
Basophils Relative: 1 %
Eosinophils Absolute: 0.1 K/uL (ref 0.0–0.5)
Eosinophils Relative: 2 %
HCT: 37.1 % (ref 36.0–46.0)
Hemoglobin: 13.1 g/dL (ref 12.0–15.0)
Immature Granulocytes: 0 %
Lymphocytes Relative: 33 %
Lymphs Abs: 1.8 K/uL (ref 0.7–4.0)
MCH: 32.1 pg (ref 26.0–34.0)
MCHC: 35.3 g/dL (ref 30.0–36.0)
MCV: 90.9 fL (ref 80.0–100.0)
Monocytes Absolute: 0.7 K/uL (ref 0.1–1.0)
Monocytes Relative: 13 %
Neutro Abs: 2.8 K/uL (ref 1.7–7.7)
Neutrophils Relative %: 51 %
Platelets: 271 K/uL (ref 150–400)
RBC: 4.08 MIL/uL (ref 3.87–5.11)
RDW: 12.7 % (ref 11.5–15.5)
WBC: 5.4 K/uL (ref 4.0–10.5)
nRBC: 0 % (ref 0.0–0.2)

## 2024-04-08 LAB — URINALYSIS, ROUTINE W REFLEX MICROSCOPIC
Bilirubin Urine: NEGATIVE
Glucose, UA: NEGATIVE mg/dL
Ketones, ur: NEGATIVE mg/dL
Nitrite: NEGATIVE
Protein, ur: NEGATIVE mg/dL
Specific Gravity, Urine: 1.005 (ref 1.005–1.030)
pH: 6 (ref 5.0–8.0)

## 2024-04-08 LAB — LIPID PANEL
Cholesterol: 190 mg/dL (ref 0–200)
HDL: 83 mg/dL
LDL Cholesterol: 90 mg/dL (ref 0–99)
Total CHOL/HDL Ratio: 2.3 ratio
Triglycerides: 85 mg/dL
VLDL: 17 mg/dL (ref 0–40)

## 2024-04-08 LAB — POCT URINE DRUG SCREEN - MANUAL ENTRY (I-SCREEN)
POC Amphetamine UR: NOT DETECTED
POC Buprenorphine (BUP): NOT DETECTED
POC Cocaine UR: NOT DETECTED
POC Marijuana UR: POSITIVE — AB
POC Methadone UR: NOT DETECTED
POC Methamphetamine UR: NOT DETECTED
POC Morphine: NOT DETECTED
POC Oxazepam (BZO): NOT DETECTED
POC Oxycodone UR: NOT DETECTED
POC Secobarbital (BAR): NOT DETECTED

## 2024-04-08 LAB — MAGNESIUM: Magnesium: 1.8 mg/dL (ref 1.7–2.4)

## 2024-04-08 LAB — TSH: TSH: 1.16 u[IU]/mL (ref 0.350–4.500)

## 2024-04-08 LAB — ETHANOL: Alcohol, Ethyl (B): 140 mg/dL — ABNORMAL HIGH

## 2024-04-08 MED ORDER — HYDROXYZINE HCL 25 MG PO TABS
25.0000 mg | ORAL_TABLET | Freq: Three times a day (TID) | ORAL | Status: DC | PRN
  Administered 2024-04-08 – 2024-04-09 (×2): 25 mg via ORAL
  Filled 2024-04-08 (×2): qty 1

## 2024-04-08 MED ORDER — ALUM & MAG HYDROXIDE-SIMETH 200-200-20 MG/5ML PO SUSP: 30.0000 mL | ORAL | Status: DC | PRN

## 2024-04-08 MED ORDER — ACETAMINOPHEN 325 MG PO TABS
650.0000 mg | ORAL_TABLET | Freq: Four times a day (QID) | ORAL | Status: DC | PRN
  Administered 2024-04-08: 650 mg via ORAL
  Filled 2024-04-08: qty 2

## 2024-04-08 MED ORDER — TRAZODONE HCL 50 MG PO TABS
50.0000 mg | ORAL_TABLET | Freq: Every evening | ORAL | Status: DC | PRN
  Administered 2024-04-08: 50 mg via ORAL
  Filled 2024-04-08: qty 1

## 2024-04-08 MED ORDER — MAGNESIUM HYDROXIDE 400 MG/5ML PO SUSP: 30.0000 mL | Freq: Every day | ORAL | Status: DC | PRN

## 2024-04-08 MED ORDER — OLANZAPINE 10 MG IM SOLR: 5.0000 mg | Freq: Three times a day (TID) | INTRAMUSCULAR | Status: DC | PRN

## 2024-04-08 MED ADMIN — Clonidine HCl Tab 0.1 MG: 0.1 mg | ORAL | NDC 60687011311

## 2024-04-08 MED FILL — Clonidine HCl Tab 0.1 MG: 0.1000 mg | ORAL | Qty: 1 | Status: AC

## 2024-04-08 NOTE — ED Notes (Signed)
 Pt has been given all her belongings and has been asked to get dressed RN has explained discharge to pt the pt continues to be very slow about getting dressed asking to stay longer explained to the pt that we can not hold her any longer and that at his time she is discharged    Annabella Gosling, CNA 04/08/24 0818

## 2024-04-08 NOTE — ED Notes (Signed)
 [facility] Psychiatry - [facility] Assessment Team Discharge Plan   Patient Name:  Tiffany [name] Date of Birth:  [birth year]  Today's Date:  April 08, 2024 Plan  - Emergency Contact Name: Sonny Simpers - Emergency Contact Number: [phone]   - Current Therapist/Psychiatrist/PCP Contact Info: Bernardino Addie Pinal, PA-C   - Recommended Follow-up Plan:   For Substance Abuse Services, follow-up with:   [facility] [address] [place], [place] [address] [phone]   [facility] [address] [place], [place] [address] [phone]   Community Based Detox Services   [facility] Facility Based Crisis Centers   [facility] [address] [place], [place] [address] [phone]   [facility] [address] [place], [place] [address] [phone]   Outpatient Treatment   [facility] [address] [place], [place] [address] [phone]   [facility] [address] [place], [place] [address] [phone]   - Guardian notified and approved:: N/A  - Patient participated in and in agreement with plan: Yes   Danny J Sprinkle, LCAS
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
## 2024-04-08 NOTE — ED Provider Notes (Signed)
 [facility] Continuous Assessment Admission H&P  Date: 04/08/2024 Patient Name: Tiffany [name] MRN: [id number] Chief Complaint: They kicked me out, didn't even detox me  Diagnoses:  Final diagnoses:  Alcohol use disorder  Bipolar I disorder (HCC)  GAD (generalized anxiety disorder)  Attention deficit hyperactivity disorder (ADHD), unspecified ADHD type    HPI: Tiffany [name] is a 58 year-old female who presents to [facility] voluntarily after being discharged from  [facility] this morning. She reports she she went there first on 12/18 and was discharged on 12/20. She went back yesterday and was discharged this morning. She then contacted [facility] complaining about this early discharge,  and was advised to come here for evaluation and possible treatment. She presents with a hx of Bipolar I, ADHD, GAD, Schizoaffective disorder and Alcohol use disorder. Patient shares that, last Friday, she was evicted due to not paying rent. She then became homeless. She also had been drinking alcohol, about a 12 pack of beer every day, which she has been doing since age 58.  Patient states she decided to seek treatment after this eviction because she noticed she was unable to pay rent due to buying alcohol instead. She admits to also using Marijuana as often as I can. Patient denies SI/HI/AVH. Her last alcohol consumption was today and she had 4 beers. Reports she has a sister who is supportive. Has a 29 year-old daughter who was raised by her sister. She expresses her interest in rehab services to work on her sobriety.   Patient is evaluated face-to-face by this provider. Chart is reviewed today 04/08/2024. Zaneta Lightcap is a 58 year-old female seen in the assessment room. She is anxious and restless with somatic behaviors: reports that she is hurting everywhere but unable to fully describe and locate the pain. She initially is irritable and manic but calms down when redirected. She is casually  dressed and groomed. Alert and oriented x 4. Thought process is coherent. Speech is pressured with increased volume. Her eye contact is fair. Patient denies SI/HI/AVH. She is preoccupied with not having [facility], saying that she was prematurely discharged.  States that upon discharge, she contacted her case worker at [facility] to express her concerns and was advised to come to [facility].  Patient reports she has been trying to get into [facility] but it did not  happen.  She also reports she ran out out Ativan  because she ran out of money and could not afford transportation to pharmacy. When asked what precipitated her premature discharge from [facility], patient refuses to share the information.  She does insist that she needs treatment for her alcohol problem, followed by a rehab program.  She reports that her withdrawal symptoms usually include increased anxiety and agitations/mania.   Patient reports that she used to have ACTT services through [facility] but quit the program because they were horrible. She is needing new ACTT services through another program.    Patient expresses motivation for treatment for her alcohol use problem. She meets criteria for treatment at  [facility]. Treatment program discussed with her and she stated she is also looking into a long term program. She will be admitted voluntarily to Observation unit  and will transfer to [facility] when a bed becomes available.      Total Time spent with patient: 1 hour  Musculoskeletal  Strength & Muscle Tone: within normal limits Gait & Station: normal Patient leans: N/A  Psychiatric Specialty Exam  Presentation General  Appearance:  Casual  Eye Contact: Fair  Speech: Pressured  Speech Volume: Increased  Handedness: Right   Mood and Affect  Mood: Irritable; Anxious  Affect: Other (comment)   Thought Process  Thought Processes: Coherent  Descriptions of Associations:Intact  Orientation:Full  (Time, Place and Person)  Thought Content:Obsessions    Hallucinations:Hallucinations: Other (comment) (just my little voices, my little friends)  Ideas of Reference:None  Suicidal Thoughts:Suicidal Thoughts: No  Homicidal Thoughts:Homicidal Thoughts: No   Sensorium  Memory: Immediate Fair; Recent Fair; Remote Fair  Judgment: Poor  Insight: Fair   Chartered Certified Accountant: Poor  Attention Span: Fair  Recall: [employer] of Knowledge: Fair  Language: Fair   Psychomotor Activity  Psychomotor Activity: Psychomotor Activity: Restlessness   Assets  Assets: Manufacturing Systems Engineer; Desire for Improvement; Financial Resources/Insurance   Sleep  Sleep: Sleep: Fair (I sleep good when things are going well)   Nutritional Assessment (For OBS and FBC admissions only) Has the patient had a weight loss or gain of 10 pounds or more in the last 3 months?: No Has the patient had a decrease in food intake/or appetite?: No Does the patient have dental problems?: No Does the patient have eating habits or behaviors that may be indicators of an eating disorder including binging or inducing vomiting?: No Has the patient recently lost weight without trying?: 0 Has the patient been eating poorly because of a decreased appetite?: 0 Malnutrition Screening Tool Score: 0    Physical Exam Vitals and nursing note reviewed.  HENT:     Head: Normocephalic.     Right Ear: Tympanic membrane normal.     Mouth/Throat:     Mouth: Mucous membranes are moist.  Eyes:     Extraocular Movements: Extraocular movements intact.     Pupils: Pupils are equal, round, and reactive to light.  Pulmonary:     Effort: Pulmonary effort is normal.  Musculoskeletal:        General: Normal range of motion.  Neurological:     General: No focal deficit present.     Mental Status: She is alert and oriented to person, place, and time.  Psychiatric:        Thought Content: Thought  content normal.    Review of Systems  Constitutional: Negative.   HENT: Negative.    Eyes: Negative.   Respiratory: Negative.    Cardiovascular: Negative.   Gastrointestinal: Negative.   Genitourinary: Negative.   Musculoskeletal: Negative.   Skin: Negative.   Neurological: Negative.   Endo/Heme/Allergies: Negative.   Psychiatric/Behavioral:  Positive for depression and substance abuse. The patient is nervous/anxious.     Blood pressure (!) 156/111, pulse (!) 135, temperature 98.4 F (36.9 C), temperature source Oral, resp. rate 18, SpO2 100%. There is no height or weight on file to calculate BMI.  Past Psychiatric History: ADHD, Bipolar, GAD, Alcohol abuse   Is the patient at risk to self? No  Has the patient been a risk to self in the past 6 months? No .    Has the patient been a risk to self within the distant past? No   Is the patient a risk to others? No   Has the patient been a risk to others in the past 6 months? No   Has the patient been a risk to others within the distant past? No   Past Medical History: See chart  Family History: NA  Social History: Currently homeless, unemployed  Last Labs:  No visits with results  within 6 Month(s) from this visit.  Latest known visit with results is:  Admission on 08/17/2023, Discharged on 08/18/2023  Component Date Value Ref Range Status   Lipase 08/17/2023 40  11 - 51 U/L Final   Performed at [facility], [address] [place], [place] [address]   Sodium 08/17/2023 121 (L)  135 - 145 mmol/L Final   Potassium 08/17/2023 3.3 (L)  3.5 - 5.1 mmol/L Final   Chloride 08/17/2023 83 (L)  98 - 111 mmol/L Final   CO2 08/17/2023 21 (L)  22 - 32 mmol/L Final   Glucose, Bld 08/17/2023 96  70 - 99 mg/dL Final   Glucose reference range applies only to samples taken after fasting for at least 8 hours.   BUN 08/17/2023 6  6 - 20 mg/dL Final   Creatinine, Ser 08/17/2023 0.74  0.44 - 1.00 mg/dL Final   Calcium  08/17/2023  9.8  8.9 - 10.3 mg/dL Final   Total Protein 94/98/7974 7.9  6.5 - 8.1 g/dL Final   Albumin 94/98/7974 4.9  3.5 - 5.0 g/dL Final   AST 94/98/7974 37  15 - 41 U/L Final   ALT 08/17/2023 28  0 - 44 U/L Final   Alkaline Phosphatase 08/17/2023 93  38 - 126 U/L Final   Total Bilirubin 08/17/2023 0.4  0.0 - 1.2 mg/dL Final   GFR, Estimated 08/17/2023 >60  >60 mL/min Final   Comment: (NOTE) Calculated using the CKD-EPI Creatinine Equation (2021)    Anion gap 08/17/2023 16 (H)  5 - 15 Final   Performed at [facility], [address] [place], [place] [address]   WBC 08/17/2023 4.9  4.0 - 10.5 K/uL Final   RBC 08/17/2023 4.06  3.87 - 5.11 MIL/uL Final   Hemoglobin 08/17/2023 13.4  12.0 - 15.0 g/dL Final   HCT 94/98/7974 37.7  36.0 - 46.0 % Final   MCV 08/17/2023 92.9  80.0 - 100.0 fL Final   MCH 08/17/2023 33.0  26.0 - 34.0 pg Final   MCHC 08/17/2023 35.5  30.0 - 36.0 g/dL Final   RDW 94/98/7974 12.3  11.5 - 15.5 % Final   Platelets 08/17/2023 248  150 - 400 K/uL Final   nRBC 08/17/2023 0.0  0.0 - 0.2 % Final   Performed at [facility], [address] [place], [place] [address]   Color, Urine 08/17/2023 YELLOW  YELLOW Final   APPearance 08/17/2023 CLEAR  CLEAR Final   Specific Gravity, Urine 08/17/2023 1.015  1.005 - 1.030 Final   pH 08/17/2023 7.0  5.0 - 8.0 Final   Glucose, UA 08/17/2023 NEGATIVE  NEGATIVE mg/dL Final   Hgb urine dipstick 08/17/2023 TRACE (A)  NEGATIVE Final   Bilirubin Urine 08/17/2023 NEGATIVE  NEGATIVE Final   Ketones, ur 08/17/2023 NEGATIVE  NEGATIVE mg/dL Final   Protein, ur 94/98/7974 NEGATIVE  NEGATIVE mg/dL Final   Nitrite 94/98/7974 NEGATIVE  NEGATIVE Final   Leukocytes,Ua 08/17/2023 NEGATIVE  NEGATIVE Final   Performed at [facility], [address] [place], [place] [address]   RBC / HPF 08/17/2023 0-5  0 - 5 RBC/hpf Final   WBC, UA 08/17/2023 NONE SEEN  0 - 5 WBC/hpf Final   Bacteria, UA 08/17/2023 RARE (A)  NONE SEEN  Final   Squamous Epithelial / HPF 08/17/2023 0-5  0 - 5 /HPF Final   Performed at [facility], [address] [place], [place] [address]  Alcohol, Ethyl (B) 08/17/2023 <15  <15 mg/dL Final   Comment: Please note change in reference range. (NOTE) For medical purposes only. Performed at [facility], [address] [place], [place] [address]    Osmolality 08/17/2023 250 (L)  275 - 295 mOsm/kg Final   Performed at [name], [address], [place], [place] [address]   Phosphorus 08/18/2023 3.1  2.5 - 4.6 mg/dL Final   Performed at [facility], [address], [place], [place] [address]   Sodium 08/18/2023 125 (L)  135 - 145 mmol/L Final   Potassium 08/18/2023 3.3 (L)  3.5 - 5.1 mmol/L Final   Chloride 08/18/2023 90 (L)  98 - 111 mmol/L Final   CO2 08/18/2023 23  22 - 32 mmol/L Final   Glucose, Bld 08/18/2023 96  70 - 99 mg/dL Final   Glucose reference range applies only to samples taken after fasting for at least 8 hours.   BUN 08/18/2023 <5 (L)  6 - 20 mg/dL Final   Creatinine, Ser 08/18/2023 0.64  0.44 - 1.00 mg/dL Final   Calcium  08/18/2023 8.9  8.9 - 10.3 mg/dL Final   GFR, Estimated 08/18/2023 >60  >60 mL/min Final   Comment: (NOTE) Calculated using the CKD-EPI Creatinine Equation (2021)    Anion gap 08/18/2023 12  5 - 15 Final   Performed at [facility], [address], [place], [place] [address]   Sodium 08/18/2023 128 (L)  135 - 145 mmol/L Final   Potassium 08/18/2023 4.5  3.5 - 5.1 mmol/L Final   Chloride 08/18/2023 96 (L)  98 - 111 mmol/L Final   CO2 08/18/2023 23  22 - 32 mmol/L Final   Glucose, Bld 08/18/2023 80  70 - 99 mg/dL Final   Glucose reference range applies only to samples taken after fasting for at least 8 hours.   BUN 08/18/2023 6  6 - 20 mg/dL Final   Creatinine, Ser 08/18/2023 0.73  0.44 - 1.00 mg/dL Final   Calcium  08/18/2023 9.4  8.9 - 10.3 mg/dL Final   GFR, Estimated 08/18/2023 >60  >60 mL/min Final    Comment: (NOTE) Calculated using the CKD-EPI Creatinine Equation (2021)    Anion gap 08/18/2023 9  5 - 15 Final   Performed at [facility], [address], [place], [place] [address]   WBC 08/18/2023 4.3  4.0 - 10.5 K/uL Final   RBC 08/18/2023 3.96  3.87 - 5.11 MIL/uL Final   Hemoglobin 08/18/2023 13.1  12.0 - 15.0 g/dL Final   HCT 94/97/7974 36.6  36.0 - 46.0 % Final   MCV 08/18/2023 92.4  80.0 - 100.0 fL Final   MCH 08/18/2023 33.1  26.0 - 34.0 pg Final   MCHC 08/18/2023 35.8  30.0 - 36.0 g/dL Final   RDW 94/97/7974 12.1  11.5 - 15.5 % Final   Platelets 08/18/2023 232  150 - 400 K/uL Final   nRBC 08/18/2023 0.0  0.0 - 0.2 % Final   Performed at [facility], [address], [place], [place] [address]    Allergies: Sulfa antibiotics, Cephalexin , and Amoxicillin-pot clavulanate  Medications:  Facility Ordered Medications  Medication   acetaminophen  (TYLENOL ) tablet 650 mg   alum & mag hydroxide-simeth (MAALOX/MYLANTA) 200-200-20 MG/5ML suspension 30 mL   magnesium  hydroxide (MILK OF MAGNESIA) suspension 30 mL   OLANZapine  (ZYPREXA ) injection 5 mg   cloNIDine  (CATAPRES ) tablet 0.1 mg   PTA Medications  Medication  Sig   omeprazole  (PRILOSEC) 40 MG capsule Take 40 mg by mouth daily.   atorvastatin  (LIPITOR) 40 MG tablet Take 40 mg by mouth at bedtime.   clopidogrel  (PLAVIX ) 75 MG tablet Take 75 mg by mouth daily.   DULoxetine  HCl 60 MG CSDR Take 120 mg by mouth daily.   fluticasone  (FLONASE ) 50 MCG/ACT nasal spray Place 2 sprays into both nostrils daily as needed for allergies.   gabapentin  (NEURONTIN ) 600 MG tablet Take 600 mg by mouth 3 (three) times daily.   hydrOXYzine  (VISTARIL ) 50 MG capsule Take 50 mg by mouth at bedtime.   levocetirizine (XYZAL ) 5 MG tablet Take 5 mg by mouth daily.   VYVANSE  70 MG capsule Take 70 mg by mouth daily.   lisinopril  (ZESTRIL ) 20 MG tablet Take 20 mg by mouth daily.   LORazepam  (ATIVAN ) 1 MG tablet Take 1 mg by mouth 3 (three)  times daily as needed for anxiety.   metoprolol  tartrate (LOPRESSOR ) 50 MG tablet Take 50 mg by mouth 2 (two) times daily.   naltrexone  (DEPADE) 50 MG tablet Take 50 mg by mouth daily.   ondansetron  (ZOFRAN ) 4 MG tablet Take 4-8 mg by mouth every 8 (eight) hours as needed for nausea or vomiting.   methocarbamol  (ROBAXIN ) 750 MG tablet Take 750 mg by mouth 3 (three) times daily.   risperiDONE  (RISPERDAL ) 1 MG tablet Take 1 mg by mouth 2 (two) times daily.   dexmethylphenidate (FOCALIN XR) 10 MG 24 hr capsule Take 10 mg by mouth in the morning and at bedtime.   dexmethylphenidate (FOCALIN) 5 MG tablet Take 5 mg by mouth 2 (two) times daily as needed (for focux).      Medical Decision Making  Admission to Observation unit, pending FBC CIWA protocol Agitation protocol  EKG  Acetaminophen  650 mg PO Q 6 PRN for mild to moderate pain Maalox 30 ml PO Q 4 hrs PRN for indigestion Milk of Magnesia 30 ml PO Daily PRN for mild constipation  Home medications under review   Labs: CBC, CMP, A1C, TSH, UA, UDS, Ethanol, Magnesium , Lipid panel, Hepatic function panel  Recommendations  Based on my evaluation the patient does not appear to have an emergency medical condition.  Randall Bouquet, NP 04/08/2024  6:48 PM

## 2024-04-08 NOTE — ED Notes (Signed)
 Patient presented to nurses station with complaint of being sent here by Burnard with [facility] who instructed her to come her for residential treatment. This nurse advised patient that she would be here overnight for observation and the providers will re-assess her in the morning and provide her with the next steps related to detoxing. Patient insists that she was at [facility] prior to here and should have remained there for detox vs coming here. The patient asked if I spoke with Burnard with [facility], this nurse advised that I had not spoken with Burnard.

## 2024-04-08 NOTE — ED Notes (Signed)
 This RN called lab to request they add on the urine culture lab to urine tubes already sent to lab previously. Lab to add on urine culture.    Alan Anette Pepper, RN 04/08/24 0800
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
## 2024-04-08 NOTE — ED Notes (Signed)
 Patient has been given hydroxyzine  and trazodone  per her request, while giving patient medication patient becomes agitated loudly talking related to the pain she is in and her seeking residential treatment. This nurse advised the patient that she was given tylenol  about an hour ago, however if she is still in pain, I can make the provider aware.
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
## 2024-04-08 NOTE — ED Provider Notes (Signed)
 EM Observation Re-evaluation note  Tiffany [name] is a 58 y.o. female, seen on rounds today.  The patient presented with a chief complaint of alcohol assessment. Patient requesting alcohol detox. Currently, the patient is resting.     Temp:  [97.3 F (36.3 C)-98 F (36.7 C)] 98 F (36.7 C) Heart Rate:  [72-102] 102 Resp:  [18] 18 BP: (106-134)/(71-87) 134/87 SpO2:  [94 %-100 %] 94 %  clear to auscultation bilaterally, non labored breathing regular rate and rhythm extremities normal, atraumatic, no cyanosis or edema Psychiatric examination: Anxious but no acute distress  MDM:  I have reviewed the labs performed to date as well as medications administered while in observation. Recent changes in the last 24 hours include presentation to the Emergency Department after she was discharged from a 3-day stay in the hospital.  Current plan is for discharge, patient does not meet inpatient admission criteria.  Patient is not under full IVC at this time.   I have discussed the case with Salomon PARAS. Sprinkle, LCAS and he notes that the patient had recommendation from the behavioral health team to discharge as she does not meet any inpatient criteria.  Patient does have an abnormal urinalysis but no urinary symptoms.  Urine culture will be sent but no empiric antibiotics will be prescribed at this time.   This document serves as a record of services personally performed by Franky Fleischer, MD. It was created on their behalf by Chiquita DELENA Mirza, Scribe, a trained medical scribe. The creation of this record is the providers dictation and/or activities during the visit.   Electronically signed by: Chiquita DELENA Mirza, Scribe 04/08/2024 6:28 AM  I agree the documentation is accurate and complete. Reviewed by: Franky Fleischer, MD 04/08/2024 6:28 AM
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
## 2024-04-08 NOTE — ED Notes (Signed)
 Pt is currently sleeping, no distress noted, environmental check complete, will continue to monitor patient for safety.

## 2024-04-09 ENCOUNTER — Other Ambulatory Visit (HOSPITAL_COMMUNITY): Admission: EM | Admit: 2024-04-09 | Discharge: 2024-04-13

## 2024-04-09 DIAGNOSIS — Z560 Unemployment, unspecified: Secondary | ICD-10-CM | POA: Diagnosis not present

## 2024-04-09 DIAGNOSIS — Z79899 Other long term (current) drug therapy: Secondary | ICD-10-CM | POA: Insufficient documentation

## 2024-04-09 DIAGNOSIS — Z7982 Long term (current) use of aspirin: Secondary | ICD-10-CM | POA: Diagnosis not present

## 2024-04-09 DIAGNOSIS — F1010 Alcohol abuse, uncomplicated: Secondary | ICD-10-CM | POA: Insufficient documentation

## 2024-04-09 DIAGNOSIS — G8929 Other chronic pain: Secondary | ICD-10-CM | POA: Insufficient documentation

## 2024-04-09 DIAGNOSIS — B182 Chronic viral hepatitis C: Secondary | ICD-10-CM | POA: Diagnosis not present

## 2024-04-09 DIAGNOSIS — F1910 Other psychoactive substance abuse, uncomplicated: Secondary | ICD-10-CM | POA: Diagnosis not present

## 2024-04-09 DIAGNOSIS — F319 Bipolar disorder, unspecified: Secondary | ICD-10-CM | POA: Insufficient documentation

## 2024-04-09 DIAGNOSIS — Z7902 Long term (current) use of antithrombotics/antiplatelets: Secondary | ICD-10-CM | POA: Diagnosis not present

## 2024-04-09 DIAGNOSIS — F411 Generalized anxiety disorder: Secondary | ICD-10-CM | POA: Insufficient documentation

## 2024-04-09 DIAGNOSIS — Z556 Problems related to health literacy: Secondary | ICD-10-CM | POA: Diagnosis not present

## 2024-04-09 DIAGNOSIS — E785 Hyperlipidemia, unspecified: Secondary | ICD-10-CM | POA: Insufficient documentation

## 2024-04-09 DIAGNOSIS — F1090 Alcohol use, unspecified, uncomplicated: Secondary | ICD-10-CM | POA: Diagnosis present

## 2024-04-09 DIAGNOSIS — F909 Attention-deficit hyperactivity disorder, unspecified type: Secondary | ICD-10-CM | POA: Diagnosis not present

## 2024-04-09 DIAGNOSIS — I10 Essential (primary) hypertension: Secondary | ICD-10-CM | POA: Insufficient documentation

## 2024-04-09 DIAGNOSIS — F259 Schizoaffective disorder, unspecified: Secondary | ICD-10-CM | POA: Insufficient documentation

## 2024-04-09 DIAGNOSIS — K0889 Other specified disorders of teeth and supporting structures: Secondary | ICD-10-CM | POA: Diagnosis not present

## 2024-04-09 DIAGNOSIS — I447 Left bundle-branch block, unspecified: Secondary | ICD-10-CM

## 2024-04-09 LAB — HEMOGLOBIN A1C
Hgb A1c MFr Bld: 5.2 % (ref 4.8–5.6)
Mean Plasma Glucose: 102.54 mg/dL

## 2024-04-09 MED ORDER — CHLORDIAZEPOXIDE HCL 25 MG PO CAPS
25.0000 mg | ORAL_CAPSULE | Freq: Four times a day (QID) | ORAL | Status: DC
  Administered 2024-04-09: 25 mg via ORAL
  Filled 2024-04-09: qty 1

## 2024-04-09 MED ORDER — METHOCARBAMOL 750 MG PO TABS
750.0000 mg | ORAL_TABLET | Freq: Three times a day (TID) | ORAL | Status: DC | PRN
  Administered 2024-04-10 – 2024-04-13 (×3): 750 mg via ORAL
  Filled 2024-04-09 (×3): qty 1

## 2024-04-09 MED ORDER — GABAPENTIN 300 MG PO CAPS
600.0000 mg | ORAL_CAPSULE | Freq: Three times a day (TID) | ORAL | Status: DC
  Administered 2024-04-09 – 2024-04-13 (×11): 600 mg via ORAL
  Filled 2024-04-09 (×11): qty 2

## 2024-04-09 MED ORDER — CALCIUM CARBONATE ANTACID 500 MG PO CHEW: 1.0000 | CHEWABLE_TABLET | Freq: Three times a day (TID) | ORAL | Status: DC | PRN

## 2024-04-09 MED ORDER — CHLORDIAZEPOXIDE HCL 25 MG PO CAPS
25.0000 mg | ORAL_CAPSULE | Freq: Four times a day (QID) | ORAL | Status: AC
  Administered 2024-04-09 – 2024-04-10 (×5): 25 mg via ORAL
  Filled 2024-04-09 (×5): qty 1

## 2024-04-09 MED ORDER — CHLORDIAZEPOXIDE HCL 25 MG PO CAPS
25.0000 mg | ORAL_CAPSULE | Freq: Every day | ORAL | Status: AC
  Administered 2024-04-12: 25 mg via ORAL
  Filled 2024-04-09: qty 1

## 2024-04-09 MED ORDER — LOSARTAN POTASSIUM 50 MG PO TABS
50.0000 mg | ORAL_TABLET | Freq: Every day | ORAL | Status: DC
  Administered 2024-04-09: 50 mg via ORAL
  Filled 2024-04-09: qty 1

## 2024-04-09 MED ORDER — VITAMIN D (ERGOCALCIFEROL) 1.25 MG (50000 UNIT) PO CAPS
50000.0000 [IU] | ORAL_CAPSULE | ORAL | Status: DC
  Administered 2024-04-13: 50000 [IU] via ORAL
  Filled 2024-04-09: qty 1

## 2024-04-09 MED ORDER — PANTOPRAZOLE SODIUM 40 MG PO TBEC
80.0000 mg | DELAYED_RELEASE_TABLET | Freq: Every day | ORAL | Status: DC
  Administered 2024-04-09: 80 mg via ORAL
  Filled 2024-04-09: qty 2

## 2024-04-09 MED ORDER — ADULT MULTIVITAMIN W/MINERALS CH
1.0000 | ORAL_TABLET | Freq: Every day | ORAL | Status: DC
  Administered 2024-04-09: 1 via ORAL
  Filled 2024-04-09: qty 1

## 2024-04-09 MED ORDER — ACETAMINOPHEN 325 MG PO TABS
650.0000 mg | ORAL_TABLET | Freq: Four times a day (QID) | ORAL | Status: DC | PRN
  Administered 2024-04-10 – 2024-04-12 (×3): 650 mg via ORAL
  Filled 2024-04-09 (×3): qty 2

## 2024-04-09 MED ORDER — METOPROLOL TARTRATE 50 MG PO TABS
50.0000 mg | ORAL_TABLET | Freq: Two times a day (BID) | ORAL | Status: DC
  Administered 2024-04-09 – 2024-04-13 (×8): 50 mg via ORAL
  Filled 2024-04-09 (×8): qty 1

## 2024-04-09 MED ORDER — CHLORDIAZEPOXIDE HCL 25 MG PO CAPS: 25.0000 mg | ORAL_CAPSULE | Freq: Every day | ORAL | Status: DC

## 2024-04-09 MED ORDER — PANTOPRAZOLE SODIUM 40 MG PO TBEC
80.0000 mg | DELAYED_RELEASE_TABLET | Freq: Every day | ORAL | Status: DC
  Administered 2024-04-10 – 2024-04-13 (×4): 80 mg via ORAL
  Filled 2024-04-09 (×4): qty 2

## 2024-04-09 MED ORDER — ASPIRIN 81 MG PO CHEW
81.0000 mg | CHEWABLE_TABLET | Freq: Every day | ORAL | Status: DC
  Administered 2024-04-09: 81 mg via ORAL
  Filled 2024-04-09: qty 1

## 2024-04-09 MED ORDER — CHLORDIAZEPOXIDE HCL 25 MG PO CAPS: 25.0000 mg | ORAL_CAPSULE | Freq: Four times a day (QID) | ORAL | Status: DC | PRN

## 2024-04-09 MED ORDER — GABAPENTIN 300 MG PO CAPS
600.0000 mg | ORAL_CAPSULE | Freq: Three times a day (TID) | ORAL | Status: DC
  Administered 2024-04-09: 600 mg via ORAL
  Filled 2024-04-09: qty 2

## 2024-04-09 MED ORDER — TRAZODONE HCL 50 MG PO TABS
50.0000 mg | ORAL_TABLET | Freq: Every evening | ORAL | Status: DC | PRN
  Administered 2024-04-12: 50 mg via ORAL
  Filled 2024-04-09: qty 1

## 2024-04-09 MED ORDER — CHLORDIAZEPOXIDE HCL 25 MG PO CAPS
25.0000 mg | ORAL_CAPSULE | ORAL | Status: AC
  Administered 2024-04-11 – 2024-04-12 (×2): 25 mg via ORAL
  Filled 2024-04-09 (×2): qty 1

## 2024-04-09 MED ORDER — OLANZAPINE 10 MG IM SOLR: 5.0000 mg | Freq: Three times a day (TID) | INTRAMUSCULAR | Status: DC | PRN

## 2024-04-09 MED ORDER — METHOCARBAMOL 750 MG PO TABS
750.0000 mg | ORAL_TABLET | Freq: Three times a day (TID) | ORAL | Status: DC | PRN
  Administered 2024-04-09: 750 mg via ORAL
  Filled 2024-04-09: qty 1

## 2024-04-09 MED ORDER — ONDANSETRON 4 MG PO TBDP: 4.0000 mg | ORAL_TABLET | Freq: Four times a day (QID) | ORAL | Status: AC | PRN

## 2024-04-09 MED ORDER — THIAMINE MONONITRATE 100 MG PO TABS
100.0000 mg | ORAL_TABLET | Freq: Every day | ORAL | Status: DC
  Administered 2024-04-10 – 2024-04-13 (×4): 100 mg via ORAL
  Filled 2024-04-09 (×4): qty 1

## 2024-04-09 MED ORDER — FOLIC ACID 1 MG PO TABS
1.0000 mg | ORAL_TABLET | Freq: Every day | ORAL | Status: DC
  Administered 2024-04-09: 1 mg via ORAL
  Filled 2024-04-09: qty 1

## 2024-04-09 MED ORDER — MAGNESIUM HYDROXIDE 400 MG/5ML PO SUSP: 30.0000 mL | Freq: Every day | ORAL | Status: DC | PRN

## 2024-04-09 MED ORDER — NICOTINE 21 MG/24HR TD PT24
21.0000 mg | MEDICATED_PATCH | Freq: Every day | TRANSDERMAL | Status: DC
  Administered 2024-04-09: 21 mg via TRANSDERMAL
  Filled 2024-04-09: qty 1

## 2024-04-09 MED ORDER — LOPERAMIDE HCL 2 MG PO CAPS: 2.0000 mg | ORAL_CAPSULE | ORAL | Status: DC | PRN

## 2024-04-09 MED ORDER — LOSARTAN POTASSIUM 50 MG PO TABS
50.0000 mg | ORAL_TABLET | Freq: Every day | ORAL | Status: DC
  Administered 2024-04-10 – 2024-04-13 (×4): 50 mg via ORAL
  Filled 2024-04-09 (×4): qty 1

## 2024-04-09 MED ORDER — ATORVASTATIN CALCIUM 40 MG PO TABS: 40.0000 mg | ORAL_TABLET | Freq: Every day | ORAL | Status: DC

## 2024-04-09 MED ORDER — VITAMIN D (ERGOCALCIFEROL) 1.25 MG (50000 UNIT) PO CAPS: 50000.0000 [IU] | ORAL_CAPSULE | ORAL | Status: DC

## 2024-04-09 MED ORDER — NICOTINE 21 MG/24HR TD PT24
21.0000 mg | MEDICATED_PATCH | Freq: Every day | TRANSDERMAL | Status: DC
  Administered 2024-04-10 – 2024-04-13 (×4): 21 mg via TRANSDERMAL
  Filled 2024-04-09 (×4): qty 1

## 2024-04-09 MED ORDER — CHLORDIAZEPOXIDE HCL 25 MG PO CAPS
25.0000 mg | ORAL_CAPSULE | Freq: Three times a day (TID) | ORAL | Status: AC
  Administered 2024-04-10 – 2024-04-11 (×3): 25 mg via ORAL
  Filled 2024-04-09 (×3): qty 1

## 2024-04-09 MED ORDER — THIAMINE MONONITRATE 100 MG PO TABS
100.0000 mg | ORAL_TABLET | Freq: Every day | ORAL | Status: DC
  Filled 2024-04-09 (×2): qty 1

## 2024-04-09 MED ORDER — THIAMINE MONONITRATE 100 MG PO TABS: 100.0000 mg | ORAL_TABLET | Freq: Every day | ORAL | Status: DC

## 2024-04-09 MED ORDER — RISPERIDONE 1 MG PO TABS
1.0000 mg | ORAL_TABLET | Freq: Two times a day (BID) | ORAL | Status: DC
  Administered 2024-04-09 – 2024-04-13 (×8): 1 mg via ORAL
  Filled 2024-04-09 (×8): qty 1

## 2024-04-09 MED ORDER — METOPROLOL TARTRATE 50 MG PO TABS
50.0000 mg | ORAL_TABLET | Freq: Two times a day (BID) | ORAL | Status: DC
  Administered 2024-04-09: 50 mg via ORAL
  Filled 2024-04-09: qty 1

## 2024-04-09 MED ORDER — LOPERAMIDE HCL 2 MG PO CAPS: 2.0000 mg | ORAL_CAPSULE | ORAL | Status: AC | PRN

## 2024-04-09 MED ORDER — CHLORDIAZEPOXIDE HCL 25 MG PO CAPS
25.0000 mg | ORAL_CAPSULE | Freq: Four times a day (QID) | ORAL | Status: AC | PRN
  Administered 2024-04-09 – 2024-04-10 (×2): 25 mg via ORAL
  Filled 2024-04-09: qty 1

## 2024-04-09 MED ORDER — CHLORDIAZEPOXIDE HCL 25 MG PO CAPS: 25.0000 mg | ORAL_CAPSULE | ORAL | Status: DC

## 2024-04-09 MED ORDER — ALUM & MAG HYDROXIDE-SIMETH 200-200-20 MG/5ML PO SUSP: 30.0000 mL | ORAL | Status: DC | PRN

## 2024-04-09 MED ORDER — ATORVASTATIN CALCIUM 40 MG PO TABS
40.0000 mg | ORAL_TABLET | Freq: Every day | ORAL | Status: DC
  Administered 2024-04-09: 40 mg via ORAL
  Filled 2024-04-09 (×4): qty 1

## 2024-04-09 MED ORDER — FOLIC ACID 1 MG PO TABS
1.0000 mg | ORAL_TABLET | Freq: Every day | ORAL | Status: DC
  Administered 2024-04-10 – 2024-04-13 (×4): 1 mg via ORAL
  Filled 2024-04-09 (×4): qty 1

## 2024-04-09 MED ORDER — THIAMINE HCL 100 MG/ML IJ SOLN
100.0000 mg | Freq: Once | INTRAMUSCULAR | Status: AC
  Administered 2024-04-09: 100 mg via INTRAMUSCULAR
  Filled 2024-04-09: qty 2

## 2024-04-09 MED ORDER — CHLORDIAZEPOXIDE HCL 25 MG PO CAPS: 25.0000 mg | ORAL_CAPSULE | Freq: Three times a day (TID) | ORAL | Status: DC

## 2024-04-09 MED ORDER — ADULT MULTIVITAMIN W/MINERALS CH
1.0000 | ORAL_TABLET | Freq: Every day | ORAL | Status: DC
  Administered 2024-04-10 – 2024-04-13 (×4): 1 via ORAL
  Filled 2024-04-09 (×4): qty 1

## 2024-04-09 MED ORDER — ONDANSETRON 4 MG PO TBDP: 4.0000 mg | ORAL_TABLET | Freq: Four times a day (QID) | ORAL | Status: DC | PRN

## 2024-04-09 MED ORDER — ASPIRIN 81 MG PO CHEW
81.0000 mg | CHEWABLE_TABLET | Freq: Every day | ORAL | Status: DC
  Administered 2024-04-10 – 2024-04-13 (×4): 81 mg via ORAL
  Filled 2024-04-09 (×4): qty 1

## 2024-04-09 MED ORDER — RISPERIDONE 1 MG PO TABS
1.0000 mg | ORAL_TABLET | Freq: Two times a day (BID) | ORAL | Status: DC
  Administered 2024-04-09: 1 mg via ORAL
  Filled 2024-04-09: qty 1

## 2024-04-09 NOTE — ED Notes (Signed)
 Pt alert in no acute distress. RR even and unlabored. Environment secured. Will continue to monitor for safety.

## 2024-04-09 NOTE — Group Note (Signed)
 Group Topic: Understanding Self  Group Date: 04/09/2024 Start Time: 2030 End Time: 2100 Facilitators: Anice Benton LABOR, NT  Department: [facility]  Number of Participants: 6  Group Focus: art therapy Treatment Modality:  Art Therapy Interventions utilized were group exercise and support Purpose: explore maladaptive thinking  Name: Tiffany [name] Date of Birth: 1965-07-03  MR: [id number]    Level of Participation: active Quality of Participation: attentive and cooperative Interactions with others: gave feedback Mood/Affect: appropriate Triggers (if applicable): N/A Cognition: coherent/clear Progress: Moderate Response: good Plan: follow-up needed  Patients Problems:  Patient Active Problem List   Diagnosis Date Noted   Alcohol use disorder 04/09/2024   Essential hypertension 08/18/2023   Chronic hyponatremia 11/06/2022   Polysubstance abuse (HCC) 11/06/2022   Colitis 11/06/2022   Hypomagnesemia 11/06/2022   Bizarre behavior 04/01/2014   Hypersexuality    Bipolar disorder, current episode mixed, moderate (HCC)    Bipolar disorder (HCC) 12/04/2013   Delusional disorder (HCC) 12/01/2013   Bipolar disorder, unspecified (HCC) 11/30/2013   Substance induced mood disorder (HCC) 11/30/2013   Hepatitis C 01/08/2013   Alcohol abuse 03/28/2011   Psychosis (HCC) 03/24/2011   Hyponatremia 03/23/2011   Hypokalemia 03/23/2011   Depression 03/23/2011   Anxiety 03/23/2011   Tobacco abuse 03/23/2011   CERVICALGIA 08/21/2007   CELLULITIS 05/18/2007   EPIDERMOID CYST 05/18/2007   DYSPEPSIA 03/05/2007

## 2024-04-09 NOTE — ED Notes (Signed)
 Patient A&O x 4, ambulatory. Patient discharged in no acute distress. Patient denied SI/HI, A/VH upon discharge.  Patient escorted to [facility] via staff for admission. Safety maintained.

## 2024-04-09 NOTE — ED Notes (Signed)
 Pt sitting in dayroom eating breakfast and interacting with peers. No acute distress noted. No concerns voiced. Informed pt to notify staff with any needs or assistance. Pt verbalized understanding and agreement. Will continue to monitor for safety.

## 2024-04-09 NOTE — ED Provider Notes (Signed)
 Facility Based Crisis Admission H&P  Date: 04/09/2024 Patient Name: Tiffany [name] MRN: [id number] Chief Complaint: Alcohol use disorder  Diagnoses:  Final diagnoses:  Alcohol use disorder  Bipolar I disorder (HCC)  GAD (generalized anxiety disorder)  Attention deficit hyperactivity disorder (ADHD), unspecified ADHD type    HPI: Tiffany [name] is a 58 year old female.  She presented to [facility] behavioral health urgent care on 04/08/2024.  Encouraged to seek assessment by [facility] care coordination.  Patient discharged from [facility] earlier on 04/08/2024.  Patient requests assistance with alcohol detox, would like residential substance use treatment.  Chart reviewed and patient discussed with attending psychiatrist, Dr. Lawrnce, on 04/09/2024.  Patient is reassessed by this nurse practitioner face-to-face.  She is seated.  She endorses pain to bilateral forearms, pain measures 10 out of 10.  Pain initiated after physical restraint at [facility] approximately 3 days ago.  Patient presents with depressed mood, congruent affect.  Tiffany [name] endorses chronic alcohol use.  Using between 8 and 12, twelve ounce beers daily for 40+ years.  Last drink on yesterday, 04/08/2024.  Patient denies symptoms of withdrawal currently.  She endorses history of alcohol related seizure.  Unable to recall most recent seizure event.  Typically experiences symptoms of alcohol withdrawal approximately once per month as she runs out of money prior to end of month.  Patient uses THC daily.  Patient would like residential substance use treatment.  Mental health diagnoses include bipolar 1 disorder, ADHD, GAD, schizoaffective disorder and alcohol use disorder.  Patient is currently seeking outpatient psychiatry providers for individual counseling and medication management.  Primary care provider, Bernardino Pinal with [facility], currently prescribing all medications.  Patient  endorses history of multiple previous inpatient psychiatric hospitalizations.  Most recent inpatient psychiatric hospitalization [facility]/06/[facility] at [facility], dx psychosis.  Family history includes patient's mother: GAD, alcohol use disorder.  Father: Alcohol use disorder.   Tiffany [name] denies SI/HI/AVH.  There is no evidence of delusional thought content no indication that patient is responding to internal stimuli.  Patient is currently homeless after being evicted from her home in [place] 2 days ago.  [facility] care coordination, Burnard Gaskins and [facility] case management provider, [facility], actively assisting in search for housing/resources.  Patient denies access to weapons.  She receives disability income, not employed outside of the home.  Patient remains voluntary.  Reviewed treatment plan to include admission to facility based crisis with ultimate goal, residential substance use treatment.  Patient verbalized understanding and agreement with plan.  Patient offered support and encouragement.  Reviewed prior to arrival medications.  Reviewed Librium , discussed potential side effects and offered patient opportunity to ask questions.  PHQ 2-9:  Flowsheet Row ED from 04/08/2024 in [facility]  Thoughts that you would be better off dead, or of hurting yourself in some way Not at all  PHQ-9 Total Score 8    Flowsheet Row ED from 04/08/2024 in [facility] ED to Hosp-Admission (Discharged) from 08/17/2023 in [facility]  SURGICAL ED from 08/06/2023 in [facility] Emergency Department at [facility]  C-SSRS RISK CATEGORY No Risk No Risk No Risk    Screenings    Flowsheet Row Most Recent Value  CIWA-Ar Total 3    Total Time spent with patient: 45 minutes  Musculoskeletal  Strength & Muscle Tone: within normal limits Gait & Station: normal Patient leans: N/A  Psychiatric Specialty Exam  Presentation General Appearance:  Appropriate for Environment; Casual  Eye Contact: Good  Speech: Clear and Coherent; Normal Rate  Speech Volume: Normal  Handedness: Right   Mood and Affect  Mood: Depressed  Affect: Depressed   Thought Process  Thought Processes: Coherent; Goal Directed; Linear  Descriptions of Associations:Intact  Orientation:Full (Time, Place and Person)  Thought Content:Logical; WDL  Diagnosis of Schizophrenia or Schizoaffective disorder in past: No   Hallucinations:Hallucinations: None  Ideas of Reference:None  Suicidal Thoughts:Suicidal Thoughts: No  Homicidal Thoughts:Homicidal Thoughts: No   Sensorium  Memory: Immediate Fair  Judgment: Fair  Insight: Fair   Art Therapist  Concentration: Fair  Attention Span: Poor  Recall: Fair  Fund of Knowledge: Good  Language: Good   Psychomotor Activity  Psychomotor Activity: Psychomotor Activity: Normal   Assets  Assets: Communication Skills; Desire for Improvement; Financial Resources/Insurance; Social Support   Sleep  Sleep: Sleep: Fair   Nutritional Assessment (For OBS and FBC admissions only) Has the patient had a weight loss or gain of 10 pounds or more in the last 3 months?: No Has the patient had a decrease in food intake/or appetite?: No Does the patient have dental problems?: No Does the patient have eating habits or behaviors that may be indicators of an eating disorder including binging or inducing vomiting?: No Has the patient recently lost weight without trying?: 0 Has the patient been eating poorly because of a decreased appetite?: 0 Malnutrition Screening Tool Score: 0    Physical Exam Vitals and nursing note reviewed.  Constitutional:      Appearance: Normal appearance. She is well-developed.  HENT:     Head: Normocephalic and atraumatic.     Nose: Nose normal.     Mouth/Throat:     Mouth: Mucous membranes are moist.      Comments: White-colored abrasion to right lateral tongue, tongue injured by unintentional biting approx 7 days ago Cardiovascular:     Rate and Rhythm: Normal rate.  Pulmonary:     Effort: Pulmonary effort is normal.  Musculoskeletal:        General: Normal range of motion.     Cervical back: Normal range of motion.  Skin:    General: Skin is warm and dry.  Neurological:     Mental Status: She is alert and oriented to person, place, and time.  Psychiatric:        Attention and Perception: Attention and perception normal.        Mood and Affect: Affect normal. Mood is anxious and depressed.        Speech: Speech normal.        Behavior: Behavior normal. Behavior is cooperative.        Thought Content: Thought content normal.        Cognition and Memory: Cognition normal.    Review of Systems  Constitutional: Negative.   HENT: Negative.    Eyes: Negative.   Respiratory: Negative.    Cardiovascular: Negative.   Gastrointestinal: Negative.   Genitourinary: Negative.   Musculoskeletal: Negative.   Skin: Negative.   Neurological: Negative.   Psychiatric/Behavioral:  Positive for depression and substance abuse. The patient is nervous/anxious.     Blood pressure 124/88, pulse (!) 102, temperature 98.5 F (36.9 C), temperature source Oral, resp. rate 18, SpO2 100%. There is no height or weight on file to calculate BMI.  Past Psychiatric History: He, bipolar disorder, GAD, alcohol use disorder  Is the patient at risk to self? No  Has the patient  been a risk to self in the past 6 months? No .    Has the patient been a risk to self within the distant past? No   Is the patient a risk to others? No   Has the patient been a risk to others in the past 6 months? No   Has the patient been a risk to others within the distant past? No   Past Medical History: GERD, eczema, hepatitis C carrier, herpes simplex virus infection, hypertension, chronic hyponatremia, hypomagnesemia Family  History: Mother: GAD, alcohol use disorder.  Father: Alcohol use disorder Social History: Patient most recently resided in [facility], recently evicted, currently homeless.  She denies access to weapons.  She receives disability income.  She endorses daily alcohol and THC use.  Last Labs:  Admission on 04/08/2024  Component Date Value Ref Range Status   WBC 04/08/2024 5.4  4.0 - 10.5 K/uL Final   RBC 04/08/2024 4.08  3.87 - 5.11 MIL/uL Final   Hemoglobin 04/08/2024 13.1  12.0 - 15.0 g/dL Final   HCT 87/77/7974 37.1  36.0 - 46.0 % Final   MCV 04/08/2024 90.9  80.0 - 100.0 fL Final   MCH 04/08/2024 32.1  26.0 - 34.0 pg Final   MCHC 04/08/2024 35.3  30.0 - 36.0 g/dL Final   RDW 87/77/7974 12.7  11.5 - 15.5 % Final   Platelets 04/08/2024 271  150 - 400 K/uL Final   nRBC 04/08/2024 0.0  0.0 - 0.2 % Final   Neutrophils Relative % 04/08/2024 51  % Final   Neutro Abs 04/08/2024 2.8  1.7 - 7.7 K/uL Final   Lymphocytes Relative 04/08/2024 33  % Final   Lymphs Abs 04/08/2024 1.8  0.7 - 4.0 K/uL Final   Monocytes Relative 04/08/2024 13  % Final   Monocytes Absolute 04/08/2024 0.7  0.1 - 1.0 K/uL Final   Eosinophils Relative 04/08/2024 2  % Final   Eosinophils Absolute 04/08/2024 0.1  0.0 - 0.5 K/uL Final   Basophils Relative 04/08/2024 1  % Final   Basophils Absolute 04/08/2024 0.0  0.0 - 0.1 K/uL Final   Immature Granulocytes 04/08/2024 0  % Final   Abs Immature Granulocytes 04/08/2024 0.02  0.00 - 0.07 K/uL Final   Performed at [facility], [address], [place], [place] [address]   Sodium 04/08/2024 132 (L)  135 - 145 mmol/L Final   Potassium 04/08/2024 3.9  3.5 - 5.1 mmol/L Final   Chloride 04/08/2024 95 (L)  98 - 111 mmol/L Final   CO2 04/08/2024 20 (L)  22 - 32 mmol/L Final   Glucose, Bld 04/08/2024 71  70 - 99 mg/dL Final   Glucose reference range applies only to samples taken after fasting for at least 8 hours.   BUN 04/08/2024 9  6 - 20 mg/dL Final   Creatinine, Ser  04/08/2024 0.64  0.44 - 1.00 mg/dL Final   Calcium  04/08/2024 9.8  8.9 - 10.3 mg/dL Final   Total Protein 87/77/7974 7.4  6.5 - 8.1 g/dL Final   Albumin 87/77/7974 4.8  3.5 - 5.0 g/dL Final   AST 87/77/7974 36  15 - 41 U/L Final   ALT 04/08/2024 26  0 - 44 U/L Final   Alkaline Phosphatase 04/08/2024 83  38 - 126 U/L Final   Total Bilirubin 04/08/2024 0.4  0.0 - 1.2 mg/dL Final   GFR, Estimated 04/08/2024 >60  >60 mL/min Final   Comment: (NOTE) Calculated using the CKD-EPI Creatinine Equation (2021)  Anion gap 04/08/2024 17 (H)  5 - 15 Final   Performed at [facility], [address] [place], [place] [address]   Magnesium  04/08/2024 1.8  1.7 - 2.4 mg/dL Final   Performed at [facility], [address] [place], [place] [address]   Hgb A1c MFr Bld 04/08/2024 5.2  4.8 - 5.6 % Final   Comment: (NOTE) Diagnosis of Diabetes The following HbA1c ranges recommended by the American Diabetes Association (ADA) may be used as an aid in the diagnosis of diabetes mellitus.  Hemoglobin             Suggested A1C NGSP%              Diagnosis  <5.7                   Non Diabetic  5.7-6.4                Pre-Diabetic  >6.4                   Diabetic  <7.0                   Glycemic control for                       adults with diabetes.     Mean Plasma Glucose 04/08/2024 102.54  mg/dL Final   Performed at [facility], [address], [place], [place] [address]   Alcohol, Ethyl (B) 04/08/2024 140 (H)  <15 mg/dL Final   Comment: (NOTE) For medical purposes only. Performed at [facility], [address], [place], [place] [address]    Cholesterol 04/08/2024 190  0 - 200 mg/dL Final   Comment:        ATP III CLASSIFICATION:  <200     mg/dL   Desirable  799-760  mg/dL   Borderline High  >=759    mg/dL   High           Triglycerides 04/08/2024 85  <150 mg/dL Final   HDL 87/77/7974 83  >40 mg/dL Final   Total CHOL/HDL Ratio 04/08/2024 2.3  RATIO Final   VLDL  04/08/2024 17  0 - 40 mg/dL Final   LDL Cholesterol 04/08/2024 90  0 - 99 mg/dL Final   Comment:        Total Cholesterol/HDL:CHD Risk Coronary Heart Disease Risk Table                     Men   Women  1/2 Average Risk   3.4   3.3  Average Risk       5.0   4.4  2 X Average Risk   9.6   7.1  3 X Average Risk  23.4   11.0        Use the calculated Patient Ratio above and the CHD Risk Table to determine the patient's CHD Risk.        ATP III CLASSIFICATION (LDL):  <100     mg/dL   Optimal  899-870  mg/dL   Near or Above                    Optimal  130-159  mg/dL   Borderline  839-810  mg/dL   High  >809     mg/dL   Very High Performed at [facility], [address], [place], [place] [address]    TSH 04/08/2024 1.160  0.350 - 4.500 uIU/mL Final   Performed at [facility], [address], [place], [place] [address]   Color, Urine 04/08/2024 STRAW (A)  YELLOW Final   APPearance 04/08/2024 CLEAR  CLEAR Final   Specific Gravity, Urine 04/08/2024 1.005  1.005 - 1.030 Final   pH 04/08/2024 6.0  5.0 - 8.0 Final   Glucose, UA 04/08/2024 NEGATIVE  NEGATIVE mg/dL Final   Hgb urine dipstick 04/08/2024 SMALL (A)  NEGATIVE Final   Bilirubin Urine 04/08/2024 NEGATIVE  NEGATIVE Final   Ketones, ur 04/08/2024 NEGATIVE  NEGATIVE mg/dL Final   Protein, ur 87/77/7974 NEGATIVE  NEGATIVE mg/dL Final   Nitrite 87/77/7974 NEGATIVE  NEGATIVE Final   Leukocytes,Ua 04/08/2024 SMALL (A)  NEGATIVE Final   RBC / HPF 04/08/2024 0-5  0 - 5 RBC/hpf Final   WBC, UA 04/08/2024 0-5  0 - 5 WBC/hpf Final   Bacteria, UA 04/08/2024 RARE (A)  NONE SEEN Final   Squamous Epithelial / HPF 04/08/2024 0-5  0 - 5 /HPF Final   Performed at [facility], [address], [place], [place] [address]   POC Amphetamine UR 04/08/2024 None Detected  NONE DETECTED (Cut Off Level 1000 ng/mL) Final   POC Secobarbital (BAR) 04/08/2024 None Detected  NONE DETECTED (Cut Off Level 300 ng/mL) Final   POC Buprenorphine  (BUP) 04/08/2024 None Detected  NONE DETECTED (Cut Off Level 10 ng/mL) Final   POC Oxazepam (BZO) 04/08/2024 None Detected  NONE DETECTED (Cut Off Level 300 ng/mL) Final   POC Cocaine UR 04/08/2024 None Detected  NONE DETECTED (Cut Off Level 300 ng/mL) Final   POC Methamphetamine UR 04/08/2024 None Detected  NONE DETECTED (Cut Off Level 1000 ng/mL) Final   POC Morphine  04/08/2024 None Detected  NONE DETECTED (Cut Off Level 300 ng/mL) Final   POC Methadone UR 04/08/2024 None Detected  NONE DETECTED (Cut Off Level 300 ng/mL) Final   POC Oxycodone  UR 04/08/2024 None Detected  NONE DETECTED (Cut Off Level 100 ng/mL) Final   POC Marijuana UR 04/08/2024 Positive (A)  NONE DETECTED (Cut Off Level 50 ng/mL) Final    Allergies: Sulfa antibiotics, Cephalexin , and Amoxicillin-pot clavulanate  Medications:  Facility Ordered Medications  Medication   acetaminophen  (TYLENOL ) tablet 650 mg   alum & mag hydroxide-simeth (MAALOX/MYLANTA) 200-200-20 MG/5ML suspension 30 mL   magnesium  hydroxide (MILK OF MAGNESIA) suspension 30 mL   OLANZapine  (ZYPREXA ) injection 5 mg   [COMPLETED] cloNIDine  (CATAPRES ) tablet 0.1 mg   hydrOXYzine  (ATARAX ) tablet 25 mg   traZODone  (DESYREL ) tablet 50 mg   atorvastatin  (LIPITOR) tablet 40 mg   aspirin  chewable tablet 81 mg   folic acid  (FOLVITE ) tablet 1 mg   gabapentin  (NEURONTIN ) capsule 600 mg   losartan  (COZAAR ) tablet 50 mg   metoprolol  tartrate (LOPRESSOR ) tablet 50 mg   nicotine  (NICODERM CQ  - dosed in mg/24 hours) patch 21 mg   pantoprazole  (PROTONIX ) EC tablet 80 mg   calcium  carbonate (TUMS - dosed in mg elemental calcium ) chewable tablet 200 mg of elemental calcium    [START ON 04/13/2024] Vitamin D  (Ergocalciferol ) (DRISDOL ) 1.25 MG (50000 UNIT) capsule 50,000 Units   methocarbamol  (ROBAXIN ) tablet 750 mg   risperiDONE  (RISPERDAL ) tablet 1 mg   multivitamin with minerals tablet 1 tablet   chlordiazePOXIDE  (LIBRIUM ) capsule 25 mg   loperamide  (IMODIUM )  capsule 2-4 mg   ondansetron  (ZOFRAN -ODT) disintegrating tablet 4 mg   chlordiazePOXIDE  (LIBRIUM ) capsule 25  mg   Followed by   NOREEN ON 04/10/2024] chlordiazePOXIDE  (LIBRIUM ) capsule 25 mg   Followed by   NOREEN ON 04/11/2024] chlordiazePOXIDE  (LIBRIUM ) capsule 25 mg   Followed by   NOREEN ON 04/12/2024] chlordiazePOXIDE  (LIBRIUM ) capsule 25 mg   [COMPLETED] thiamine  (VITAMIN B1) injection 100 mg   [START ON 04/10/2024] thiamine  (VITAMIN B1) tablet 100 mg   PTA Medications  Medication Sig   omeprazole  (PRILOSEC) 40 MG capsule Take 40 mg by mouth daily.   atorvastatin  (LIPITOR) 40 MG tablet Take 40 mg by mouth at bedtime.   clopidogrel  (PLAVIX ) 75 MG tablet Take 75 mg by mouth daily.   DULoxetine  HCl 60 MG CSDR Take 120 mg by mouth daily.   fluticasone  (FLONASE ) 50 MCG/ACT nasal spray Place 2 sprays into both nostrils in the morning and at bedtime.   gabapentin  (NEURONTIN ) 600 MG tablet Take 600 mg by mouth 3 (three) times daily.   metoprolol  tartrate (LOPRESSOR ) 50 MG tablet Take 50 mg by mouth 2 (two) times daily.   naltrexone  (DEPADE) 50 MG tablet Take 50 mg by mouth daily.   risperiDONE  (RISPERDAL ) 1 MG tablet Take 1 mg by mouth 2 (two) times daily.   dexmethylphenidate (FOCALIN) 5 MG tablet Take 5 mg by mouth 2 (two) times daily.   diphenhydrAMINE  (BENADRYL ) 25 mg capsule Take 25 mg by mouth daily as needed for itching.   methocarbamol  (ROBAXIN ) 750 MG tablet Take 750 mg by mouth 3 (three) times daily as needed for muscle spasms.   folic acid  (FOLVITE ) 1 MG tablet Take 1 mg by mouth daily.   Ferrous Sulfate (IRON PO) Take 1 tablet by mouth daily as needed (For iron supplement).   nicotine  (NICODERM CQ  - DOSED IN MG/24 HOURS) 21 mg/24hr patch Place 21 mg onto the skin daily.   losartan  (COZAAR ) 50 MG tablet Take 50 mg by mouth daily.   thiamine  (VITAMIN B1) 100 MG tablet Take 100 mg by mouth daily.   MAGNESIUM  PO Take 1 tablet by mouth daily as needed (For magnesium  supplement).    Vitamin D , Ergocalciferol , (DRISDOL ) 1.25 MG (50000 UNIT) CAPS capsule Take 50,000 Units by mouth every Saturday.   ondansetron  (ZOFRAN ) 4 MG tablet Take 4-8 mg by mouth every 8 (eight) hours as needed for nausea.   LORazepam  (ATIVAN ) 1 MG tablet Take 1 mg by mouth every 8 (eight) hours.   hydrOXYzine  (VISTARIL ) 50 MG capsule Take 50 mg by mouth at bedtime.   hydrochlorothiazide (HYDRODIURIL) 25 MG tablet Take 25 mg by mouth daily.   dexmethylphenidate (FOCALIN XR) 10 MG 24 hr capsule Take 10 mg by mouth daily.   aspirin  81 MG chewable tablet Chew by mouth daily.   calcium  carbonate (TUMS - DOSED IN MG ELEMENTAL CALCIUM ) 500 MG chewable tablet Chew 1 tablet by mouth 3 (three) times daily as needed for indigestion or heartburn.    Long Term Goals: Improvement in symptoms so as ready for discharge  Short Term Goals: Patient will verbalize feelings in meetings with treatment team members., Patient will attend at least of 50% of the groups daily., Pt will complete the PHQ9 on admission, day 3 and discharge., Patient will participate in completing the [place] Suicide Severity Rating Scale, Patient will score a low risk of violence for 24 hours prior to discharge, and Patient will take medications as prescribed daily.  Medical Decision Making  Labs Reviewed collected 04/08/2024:  CMP: Sodium decreased 132, chloride decreased at 95, CO2 decreased 20, anion gap elevated 17.  UA: Leukocytes small, bacteria rare.  Denies urinary complaints/symptoms. BAL: Elevated 140 UDS: + THC  New lab orders placed for collection 04/10/2024: Repeat CMP,  Vitamin D   Restart prior to admission medications: -Aspirin  chewable 81 mg daily -Atorvastatin  40 mg nightly -Calcium  carbonate (Tums) chewable tablet 200 mg 3 times daily as needed/indigestion or heartburn -Folic acid  1 mg daily -Gabapentin  600 mg 3 times daily -Losartan  50 mg daily -Methocarbamol  750 mg 3 times daily as needed/muscle spasm -Metoprolol  to  tartrate 50 mg twice daily -Multivitamin with minerals 1 tablet daily -Nicotine  patch 21 mg transdermal daily -Pantoprazole  EC 80 mg daily -Vitamin D  (ergocalciferol ) 1.25 mg 50,000 units oral every Saturday  CIWA Librium  protocol initiated: -Loperamide  2 to 4 mg oral as needed/diarrhea or loose stools -Chlordiazepoxide  25 mg 4 times daily x4 doses, 1 mg 3 times daily x3 doses, 1 mg 2 times daily x2 doses, 1 mg daily x1 dose -Chlordiazepoxide  25 mg every 6 hours as needed CIWA greater than 10 -Ondansetron  disintegrating tablet 4 mg every 6 as needed/nausea or vomiting -Thiamine  injection 100 mg IM once -Thiamine  tablet 100 mg daily  Continue: Current medications: -Acetaminophen  650 mg every 6 hours, as needed/mild pain -Maalox 30 mL oral every 4 hours, as needed/digestion -Magnesium  hydroxide 30 mL daily as needed/mild constipation -Trazodone  50 mg nightly, as needed/sleep  Agitation Protocol continues    Discharge Planning: Disposition/case management to assist with discharge planning and identification of follow-up needs including residential or outpatient substance use treatment options as needed, prior to discharge Discharge Concerns: Need to confirm safety plan; Medication compliance and effectiveness Discharge Goals: Return home with outpatient referrals for mental health follow-up including medication management/psychotherapy and substance use treatment follow-up resources as appropriate    Recommendations  Based on my evaluation the patient does not appear to have an emergency medical condition.  Tiffany LITTIE Dawn, FNP 04/09/2024  2:57 PM

## 2024-04-09 NOTE — ED Notes (Signed)
 Patient was provided breakfast

## 2024-04-09 NOTE — ED Notes (Signed)
 Pt is resting quietly, observed to turn and reposition during safety rounds. Safety maintained.

## 2024-04-09 NOTE — ED Notes (Signed)
 Pt transferred from observation unit to [facility] requesting detox from ETOH. Pt denies SI/HI/AVH. Calm, cooperative throughout interview process. Pt reports last drink was yesterday prior to coming to facility. Pt reports drinking a 12 pk of beer daily. Encouragement provided. Skin assessment completed. Oriented to unit. Meal and drink offered. Pt verbally contract for safety. Will monitor for safety.

## 2024-04-09 NOTE — ED Notes (Signed)
 Pt sitting in dayroom watching television. No acute distress noted. No concerns voiced. Informed pt to notify staff with any needs or assistance. Pt verbalized understanding and agreement. Will continue to monitor for safety.

## 2024-04-09 NOTE — ED Notes (Signed)
 Patient denies pain and is resting comfortably.

## 2024-04-09 NOTE — ED Notes (Signed)
 Patient was provided lunch

## 2024-04-09 NOTE — Group Note (Signed)
 Group Topic: Healthy Self Image and Positive Change  Group Date: 04/09/2024 Start Time: 1315 End Time: 1400 Facilitators: Alyse Leilani LABOR, NT  Department: [facility]  Number of Participants: 7  Group Focus: coping skills, daily focus, and healthy friendships Treatment Modality:  Skills Training Interventions utilized were clarification Purpose: regain self-worth and reinforce self-care  Name: Tiffany [name] Date of Birth: January 23, 1966  MR: [id number]    Pt was not on the unit Patients Problems:  Patient Active Problem List   Diagnosis Date Noted   Essential hypertension 08/18/2023   Chronic hyponatremia 11/06/2022   Polysubstance abuse (HCC) 11/06/2022   Colitis 11/06/2022   Hypomagnesemia 11/06/2022   Bizarre behavior 04/01/2014   Hypersexuality    Bipolar disorder, current episode mixed, moderate (HCC)    Bipolar disorder (HCC) 12/04/2013   Delusional disorder (HCC) 12/01/2013   Bipolar disorder, unspecified (HCC) 11/30/2013   Substance induced mood disorder (HCC) 11/30/2013   Hepatitis C 01/08/2013   Alcohol abuse 03/28/2011   Psychosis (HCC) 03/24/2011   Hyponatremia 03/23/2011   Hypokalemia 03/23/2011   Depression 03/23/2011   Anxiety 03/23/2011   Tobacco abuse 03/23/2011   CERVICALGIA 08/21/2007   CELLULITIS 05/18/2007   EPIDERMOID CYST 05/18/2007   DYSPEPSIA 03/05/2007

## 2024-04-09 NOTE — ED Notes (Signed)
 Pt is currently sleeping, no distress noted, environmental check complete, will continue to monitor patient for safety.

## 2024-04-09 NOTE — ED Notes (Signed)
 Pt received at the beginning of shift in her room.  Deied SI affect flat. Received her libium at 13.  Pt education rendered. Safety maintained.

## 2024-04-10 DIAGNOSIS — K0889 Other specified disorders of teeth and supporting structures: Secondary | ICD-10-CM | POA: Diagnosis not present

## 2024-04-10 MED ORDER — IBUPROFEN 400 MG PO TABS: 400.0000 mg | ORAL_TABLET | Freq: Four times a day (QID) | ORAL | Status: DC | PRN

## 2024-04-10 MED ORDER — NALTREXONE HCL 50 MG PO TABS
50.0000 mg | ORAL_TABLET | Freq: Every day | ORAL | Status: DC
  Administered 2024-04-10 – 2024-04-13 (×4): 50 mg via ORAL
  Filled 2024-04-10 (×4): qty 1

## 2024-04-10 MED ORDER — DULOXETINE HCL 30 MG PO CPEP
30.0000 mg | ORAL_CAPSULE | Freq: Every day | ORAL | Status: DC
  Administered 2024-04-10 – 2024-04-13 (×4): 30 mg via ORAL
  Filled 2024-04-10 (×4): qty 1

## 2024-04-10 MED ORDER — HYDROXYZINE HCL 25 MG PO TABS
25.0000 mg | ORAL_TABLET | Freq: Four times a day (QID) | ORAL | Status: DC | PRN
  Administered 2024-04-10 – 2024-04-13 (×7): 25 mg via ORAL
  Filled 2024-04-10 (×8): qty 1

## 2024-04-10 NOTE — Care Management (Signed)
 [facility] Care Management...  Writer met with the client to discuss discharge planning.  Writer informed the client they average stay here is 3-5 days.  Client reports she's interested in a program that's at least 30 days.  Client reports she's open to any facility.  Client reports she has a trespassing pending charge on 05/08/24.  Client reports she will follow up with her sister and attorney regarding if she can miss court if she enters into treatment.  Client will ask her attorney for a continuance on her behalf.  Client reports she was trespassing on her residence that she was evicted from.  Writer will fax referrals to [facility], [facility], and [facility].  Client's sister is Sonny Rim: [phone].  Client reports going to several inpatient treatment facilities in the past.   Client reports her drug of choice is alcohol.  Client reports consuming approximately 8 twelve oz beers daily.

## 2024-04-10 NOTE — Care Management (Signed)
 [facility] Care Management.Scientist, Research (medical) assisted patient in completing phone assessment with [facility]  Per [place] @ [facility].SABRA Everything looks good, just need to verify insurance. Once that is done [facility] will call to schedule pickup  Writer advised patient that she can have her drop off clothing at anytime  Per [facility].. They can not accept patient due to insurance [employer]

## 2024-04-10 NOTE — Care Management (Addendum)
 [facility] Care Management...  Client requested for the social worker to contact her sister, Sonny Robins([phone]).  She reports she does not have the best relationship with her sister but has sister has been helping her coordinate services with her attorney, Norleen Sar.    Client reports Norleen Sar is representing her trespassing case.  Client reports she has court on 05/09/23.  Writer called the client's sister, Sonny.  Sonny reports the client needs inpatient treatment for at minimum of 30 days.  Sonny reports she feels her sister has needed inpatient treatment for  a while but she's glad she's here now.  Sonny has reached out to Norleen Sar, her attorney, but has yet to hear back due to the holidays.    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  Sonny reports she will send all of this information to [place].  Sonny also requested a referral be sent to [facility].  She reports the client had an appointment with [facility] on 03/25/24 but didn't go.  Writer informed the client as of yesterday [facility] had no female beds and the wait time was over a week.    Referrals have been sent [facility], [facility], and [facility].  Client has be declined at [facility] due to insurance.   Writer sent referral to [facility] as well.  Writer provided [place] with each agencies contact information so she can contact them directly to gain more information about their program.   UPDATE:04/10/24 at 3:20pm  Client reports she does not want her referral to be sent to [facility].  Client reports she should get accepted into [facility].  She prefers to go there and they would allow her to smoke.  Client reports she completed the phone interview with [facility] earlier today.  Client is pending acceptance into their program.

## 2024-04-10 NOTE — Group Note (Signed)
 Group Topic: Relapse and Recovery  Group Date: 04/10/2024 Start Time: 1100 End Time: 1200 Facilitators: Kindred Reidinger, LCSW  Department: [facility]  Number of Participants: 5  Group Focus: abuse issues, chemical dependency education, chemical dependency issues, communication, relapse prevention, and substance abuse education Treatment Modality:  Cognitive Behavioral Therapy Interventions utilized were group exercise and support Purpose: enhance coping skills, relapse prevention strategies, and trigger / craving management   Writer met with the group to discuss holidays and recovery.  Writer utilized a CBT activity to discuss substance use and how and what triggers cause relapse during this time of the year.  All group members stated they would rather be at home but this is the place where they need to be.  Writer also explored with each client how they want Christmas to look for them next year. Name: Tiffany [name] Date of Birth: 03-03-1966  MR: [id number]    Level of Participation: active Quality of Participation: cooperative and engaged Interactions with others: gave feedback Mood/Affect: brightens with interaction Triggers (if applicable): relationship with her daughter and sister.  No romantic relationship  Cognition: logical Progress: Moderate Response: Client gave hopeful feedback and reports she would like residential treatment. Plan: follow-up as needed  Patients Problems:  Patient Active Problem List   Diagnosis Date Noted   Alcohol use disorder 04/09/2024   Essential hypertension 08/18/2023   Chronic hyponatremia 11/06/2022   Polysubstance abuse (HCC) 11/06/2022   Colitis 11/06/2022   Hypomagnesemia 11/06/2022   Bizarre behavior 04/01/2014   Hypersexuality    Bipolar disorder, current episode mixed, moderate (HCC)    Bipolar disorder (HCC) 12/04/2013   Delusional disorder (HCC) 12/01/2013   Bipolar disorder, unspecified (HCC)  11/30/2013   Substance induced mood disorder (HCC) 11/30/2013   Hepatitis C 01/08/2013   Alcohol abuse 03/28/2011   Psychosis (HCC) 03/24/2011   Hyponatremia 03/23/2011   Hypokalemia 03/23/2011   Depression 03/23/2011   Anxiety 03/23/2011   Tobacco abuse 03/23/2011   CERVICALGIA 08/21/2007   CELLULITIS 05/18/2007   EPIDERMOID CYST 05/18/2007   DYSPEPSIA 03/05/2007

## 2024-04-10 NOTE — ED Notes (Signed)
 Patient denies pain and is resting comfortably.

## 2024-04-10 NOTE — ED Notes (Signed)
 Pt observed at the beginning of shift awake, , appropriate, and flat, interacting with others appropriately.  Safety maintained.

## 2024-04-10 NOTE — ED Provider Notes (Addendum)
 Behavioral Health Progress Note  Date and Time: 04/10/2024 12:39 PM Name: Tiffany [name] MRN:  [id number]  Patient is a  58y.o. female who presents to the [facility] unit due to alcohol abuse and requesting detox and referral to residential substance use treatment program.   Interval History Patient was seen today for re-evaluation.  Nursing reports that patient complained of various body aches overnight. The patient has no issues with performing ADLs.  Patient has been medication compliant.    Patient was seen and interviewed by attending psychiatrist. Chart reviewed. Patient discussed during treatment team rounds.  Subjective:  On assessment patient reports feeling rough, complaints about tooth ache and L ear aches, that is likely related to toothache. We discussed alternation of Tylenol  and Ibuprofen  for anti-pain and antiinflammatory effect. Patient also complaints about anxiety, says she was on Ativan  before; we discussed alternatives,m she accepted to try PRN Hydroxyzine . Mood is somewhat depressed, although denies thoughts or plans of hurting self or others.She denies auditory or visual hallucinations, denies feeling paranoid, unsafe, does not express any delusions.  She reports no side effects from medications he is getting here. She is interested in residential addicton treatment.  Addendum 3:45pm: patient requested to talk about her medications. She states she supposed to be on Cymbalta  for mood and pain and also is asking to restart Naltrexone  for alcohol cravings; states she was on the medication in the past and it was effective. Asking for vit D and B, explained that she is already prescribed multivitamin daily and Vit D additionally every Saturday.  Diagnosis:  Final diagnoses:  None    Total Time spent with patient: 30 minutes  Past Psychiatric History: see H&P Past Medical History: see H&P Family History: see H&P Family Psychiatric  History: see H&P Social History:  see  H&P  Sleep: Negative  Appetite:  Good  Current Medications:  Current Facility-Administered Medications  Medication Dose Route Frequency Provider Last Rate Last Admin   acetaminophen  (TYLENOL ) tablet 650 mg  650 mg Oral Q6H PRN Dasie Ellouise CROME, FNP   650 mg at 04/10/24 0929   alum & mag hydroxide-simeth (MAALOX/MYLANTA) 200-200-20 MG/5ML suspension 30 mL  30 mL Oral Q4H PRN Dasie Ellouise CROME, FNP       aspirin  chewable tablet 81 mg  81 mg Oral Daily Dasie Ellouise CROME, FNP   81 mg at 04/10/24 9071   atorvastatin  (LIPITOR) tablet 40 mg  40 mg Oral QHS Allen, Tina L, FNP   40 mg at 04/09/24 2142   calcium  carbonate (TUMS - dosed in mg elemental calcium ) chewable tablet 200 mg of elemental calcium   1 tablet Oral TID PRN Dasie Ellouise CROME, FNP       chlordiazePOXIDE  (LIBRIUM ) capsule 25 mg  25 mg Oral Q6H PRN Dasie Ellouise CROME, FNP   25 mg at 04/09/24 1946   chlordiazePOXIDE  (LIBRIUM ) capsule 25 mg  25 mg Oral QID Dasie Ellouise CROME, FNP   25 mg at 04/10/24 9072   Followed by   chlordiazePOXIDE  (LIBRIUM ) capsule 25 mg  25 mg Oral TID Dasie Ellouise CROME, FNP       Followed by   NOREEN ON 04/11/2024] chlordiazePOXIDE  (LIBRIUM ) capsule 25 mg  25 mg Oral Letha Dasie Ellouise CROME, FNP       Followed by   NOREEN ON 04/12/2024] chlordiazePOXIDE  (LIBRIUM ) capsule 25 mg  25 mg Oral Daily Dasie Ellouise CROME, FNP       folic acid  (FOLVITE ) tablet 1 mg  1 mg Oral  Daily Dasie Ellouise CROME, FNP   1 mg at 04/10/24 9071   gabapentin  (NEURONTIN ) capsule 600 mg  600 mg Oral TID Dasie Ellouise CROME, FNP   600 mg at 04/10/24 9071   hydrOXYzine  (ATARAX ) tablet 25 mg  25 mg Oral Q6H PRN Enya Bureau, MD   25 mg at 04/10/24 0950   ibuprofen  (ADVIL ) tablet 400 mg  400 mg Oral Q6H PRN Mackinsey Pelland, MD       loperamide  (IMODIUM ) capsule 2-4 mg  2-4 mg Oral PRN Dasie Ellouise CROME, FNP       losartan  (COZAAR ) tablet 50 mg  50 mg Oral Daily Dasie Ellouise CROME, FNP   50 mg at 04/10/24 9070   magnesium  hydroxide (MILK OF MAGNESIA) suspension 30 mL  30 mL Oral Daily PRN Allen,  Tina L, FNP       methocarbamol  (ROBAXIN ) tablet 750 mg  750 mg Oral TID PRN Allen, Tina L, FNP   750 mg at 04/10/24 0612   metoprolol  tartrate (LOPRESSOR ) tablet 50 mg  50 mg Oral BID Allen, Tina L, FNP   50 mg at 04/10/24 0929   multivitamin with minerals tablet 1 tablet  1 tablet Oral Daily Dasie Ellouise CROME, FNP   1 tablet at 04/10/24 9072   nicotine  (NICODERM CQ  - dosed in mg/24 hours) patch 21 mg  21 mg Transdermal Daily Dasie Ellouise CROME, FNP   21 mg at 04/10/24 9072   OLANZapine  (ZYPREXA ) injection 5 mg  5 mg Intramuscular TID PRN Allen, Tina L, FNP       ondansetron  (ZOFRAN -ODT) disintegrating tablet 4 mg  4 mg Oral Q6H PRN Dasie Ellouise CROME, FNP       pantoprazole  (PROTONIX ) EC tablet 80 mg  80 mg Oral Daily Dasie Ellouise CROME, FNP   80 mg at 04/10/24 9072   risperiDONE  (RISPERDAL ) tablet 1 mg  1 mg Oral BID Dasie Ellouise CROME, FNP   1 mg at 04/10/24 9071   thiamine  (VITAMIN B1) tablet 100 mg  100 mg Oral Daily Dasie Ellouise CROME, FNP   100 mg at 04/10/24 9070   traZODone  (DESYREL ) tablet 50 mg  50 mg Oral QHS PRN Dasie Ellouise CROME, FNP       [START ON 04/13/2024] Vitamin D  (Ergocalciferol ) (DRISDOL ) 1.25 MG (50000 UNIT) capsule 50,000 Units  50,000 Units Oral Q Sat Dasie Ellouise CROME, FNP       Current Outpatient Medications  Medication Sig Dispense Refill   aspirin  81 MG chewable tablet Chew by mouth daily.     atorvastatin  (LIPITOR) 40 MG tablet Take 40 mg by mouth at bedtime.     calcium  carbonate (TUMS - DOSED IN MG ELEMENTAL CALCIUM ) 500 MG chewable tablet Chew 1 tablet by mouth 3 (three) times daily as needed for indigestion or heartburn.     clopidogrel  (PLAVIX ) 75 MG tablet Take 75 mg by mouth daily.     dexmethylphenidate (FOCALIN XR) 10 MG 24 hr capsule Take 10 mg by mouth daily.     dexmethylphenidate (FOCALIN) 5 MG tablet Take 5 mg by mouth 2 (two) times daily.     diphenhydrAMINE  (BENADRYL ) 25 mg capsule Take 25 mg by mouth daily as needed for itching.     DULoxetine  HCl 60 MG CSDR Take 120 mg by mouth  daily.     Ferrous Sulfate (IRON PO) Take 1 tablet by mouth daily as needed (For iron supplement).     fluticasone  (FLONASE ) 50 MCG/ACT nasal spray Place 2 sprays into  both nostrils in the morning and at bedtime.     folic acid  (FOLVITE ) 1 MG tablet Take 1 mg by mouth daily.     gabapentin  (NEURONTIN ) 600 MG tablet Take 600 mg by mouth 3 (three) times daily.     hydrochlorothiazide (HYDRODIURIL) 25 MG tablet Take 25 mg by mouth daily.     hydrOXYzine  (VISTARIL ) 50 MG capsule Take 50 mg by mouth at bedtime.     LORazepam  (ATIVAN ) 1 MG tablet Take 1 mg by mouth every 8 (eight) hours.     losartan  (COZAAR ) 50 MG tablet Take 50 mg by mouth daily.     MAGNESIUM  PO Take 1 tablet by mouth daily as needed (For magnesium  supplement).     methocarbamol  (ROBAXIN ) 750 MG tablet Take 750 mg by mouth 3 (three) times daily as needed for muscle spasms.     metoprolol  tartrate (LOPRESSOR ) 50 MG tablet Take 50 mg by mouth 2 (two) times daily.     naltrexone  (DEPADE) 50 MG tablet Take 50 mg by mouth daily.     nicotine  (NICODERM CQ  - DOSED IN MG/24 HOURS) 21 mg/24hr patch Place 21 mg onto the skin daily.     omeprazole  (PRILOSEC) 40 MG capsule Take 40 mg by mouth daily.     ondansetron  (ZOFRAN ) 4 MG tablet Take 4-8 mg by mouth every 8 (eight) hours as needed for nausea.     risperiDONE  (RISPERDAL ) 1 MG tablet Take 1 mg by mouth 2 (two) times daily.     thiamine  (VITAMIN B1) 100 MG tablet Take 100 mg by mouth daily.     Vitamin D , Ergocalciferol , (DRISDOL ) 1.25 MG (50000 UNIT) CAPS capsule Take 50,000 Units by mouth every Saturday.      Labs  Lab Results:  Admission on 04/08/2024, Discharged on 04/09/2024  Component Date Value Ref Range Status   WBC 04/08/2024 5.4  4.0 - 10.5 K/uL Final   RBC 04/08/2024 4.08  3.87 - 5.11 MIL/uL Final   Hemoglobin 04/08/2024 13.1  12.0 - 15.0 g/dL Final   HCT 87/77/7974 37.1  36.0 - 46.0 % Final   MCV 04/08/2024 90.9  80.0 - 100.0 fL Final   MCH 04/08/2024 32.1  26.0 - 34.0  pg Final   MCHC 04/08/2024 35.3  30.0 - 36.0 g/dL Final   RDW 87/77/7974 12.7  11.5 - 15.5 % Final   Platelets 04/08/2024 271  150 - 400 K/uL Final   nRBC 04/08/2024 0.0  0.0 - 0.2 % Final   Neutrophils Relative % 04/08/2024 51  % Final   Neutro Abs 04/08/2024 2.8  1.7 - 7.7 K/uL Final   Lymphocytes Relative 04/08/2024 33  % Final   Lymphs Abs 04/08/2024 1.8  0.7 - 4.0 K/uL Final   Monocytes Relative 04/08/2024 13  % Final   Monocytes Absolute 04/08/2024 0.7  0.1 - 1.0 K/uL Final   Eosinophils Relative 04/08/2024 2  % Final   Eosinophils Absolute 04/08/2024 0.1  0.0 - 0.5 K/uL Final   Basophils Relative 04/08/2024 1  % Final   Basophils Absolute 04/08/2024 0.0  0.0 - 0.1 K/uL Final   Immature Granulocytes 04/08/2024 0  % Final   Abs Immature Granulocytes 04/08/2024 0.02  0.00 - 0.07 K/uL Final   Performed at [facility], [address] [place], [place] [address]   Sodium 04/08/2024 132 (L)  135 - 145 mmol/L Final   Potassium 04/08/2024 3.9  3.5 - 5.1 mmol/L Final   Chloride 04/08/2024 95 (L)  98 - 111 mmol/L Final   CO2 04/08/2024 20 (L)  22 - 32 mmol/L Final   Glucose, Bld 04/08/2024 71  70 - 99 mg/dL Final   Glucose reference range applies only to samples taken after fasting for at least 8 hours.   BUN 04/08/2024 9  6 - 20 mg/dL Final   Creatinine, Ser 04/08/2024 0.64  0.44 - 1.00 mg/dL Final   Calcium  04/08/2024 9.8  8.9 - 10.3 mg/dL Final   Total Protein 87/77/7974 7.4  6.5 - 8.1 g/dL Final   Albumin 87/77/7974 4.8  3.5 - 5.0 g/dL Final   AST 87/77/7974 36  15 - 41 U/L Final   ALT 04/08/2024 26  0 - 44 U/L Final   Alkaline Phosphatase 04/08/2024 83  38 - 126 U/L Final   Total Bilirubin 04/08/2024 0.4  0.0 - 1.2 mg/dL Final   GFR, Estimated 04/08/2024 >60  >60 mL/min Final   Comment: (NOTE) Calculated using the CKD-EPI Creatinine Equation (2021)    Anion gap 04/08/2024 17 (H)  5 - 15 Final   Performed at [facility], [address], [place], [place] [address]    Magnesium  04/08/2024 1.8  1.7 - 2.4 mg/dL Final   Performed at [facility], [address], [place], [place] [address]   Hgb A1c MFr Bld 04/08/2024 5.2  4.8 - 5.6 % Final   Comment: (NOTE) Diagnosis of Diabetes The following HbA1c ranges recommended by the American Diabetes Association (ADA) may be used as an aid in the diagnosis of diabetes mellitus.  Hemoglobin             Suggested A1C NGSP%              Diagnosis  <5.7                   Non Diabetic  5.7-6.4                Pre-Diabetic  >6.4                   Diabetic  <7.0                   Glycemic control for                       adults with diabetes.     Mean Plasma Glucose 04/08/2024 102.54  mg/dL Final   Performed at [facility], [address] [place], [place] [address]   Alcohol, Ethyl (B) 04/08/2024 140 (H)  <15 mg/dL Final   Comment: (NOTE) For medical purposes only. Performed at [facility], [address], [place], [place] [address]    Cholesterol 04/08/2024 190  0 - 200 mg/dL Final   Comment:        ATP III CLASSIFICATION:  <200     mg/dL   Desirable  799-760  mg/dL   Borderline High  >=759    mg/dL   High           Triglycerides 04/08/2024 85  <150 mg/dL Final   HDL 87/77/7974 83  >40 mg/dL Final   Total CHOL/HDL Ratio 04/08/2024 2.3  RATIO Final   VLDL 04/08/2024 17  0 - 40 mg/dL Final   LDL Cholesterol 04/08/2024 90  0 - 99 mg/dL Final   Comment:        Total Cholesterol/HDL:CHD Risk Coronary Heart Disease Risk Table  Men   Women  1/2 Average Risk   3.4   3.3  Average Risk       5.0   4.4  2 X Average Risk   9.6   7.1  3 X Average Risk  23.4   11.0        Use the calculated Patient Ratio above and the CHD Risk Table to determine the patient's CHD Risk.        ATP III CLASSIFICATION (LDL):  <100     mg/dL   Optimal  899-870  mg/dL   Near or Above                    Optimal  130-159  mg/dL   Borderline  839-810  mg/dL   High  >809     mg/dL    Very High Performed at [facility], [address] [place], [place] [address]    TSH 04/08/2024 1.160  0.350 - 4.500 uIU/mL Final   Performed at [facility], [address], [place], [place] [address]   Color, Urine 04/08/2024 STRAW (A)  YELLOW Final   APPearance 04/08/2024 CLEAR  CLEAR Final   Specific Gravity, Urine 04/08/2024 1.005  1.005 - 1.030 Final   pH 04/08/2024 6.0  5.0 - 8.0 Final   Glucose, UA 04/08/2024 NEGATIVE  NEGATIVE mg/dL Final   Hgb urine dipstick 04/08/2024 SMALL (A)  NEGATIVE Final   Bilirubin Urine 04/08/2024 NEGATIVE  NEGATIVE Final   Ketones, ur 04/08/2024 NEGATIVE  NEGATIVE mg/dL Final   Protein, ur 87/77/7974 NEGATIVE  NEGATIVE mg/dL Final   Nitrite 87/77/7974 NEGATIVE  NEGATIVE Final   Leukocytes,Ua 04/08/2024 SMALL (A)  NEGATIVE Final   RBC / HPF 04/08/2024 0-5  0 - 5 RBC/hpf Final   WBC, UA 04/08/2024 0-5  0 - 5 WBC/hpf Final   Bacteria, UA 04/08/2024 RARE (A)  NONE SEEN Final   Squamous Epithelial / HPF 04/08/2024 0-5  0 - 5 /HPF Final   Performed at [facility], [address], [place], [place] [address]   POC Amphetamine UR 04/08/2024 None Detected  NONE DETECTED (Cut Off Level 1000 ng/mL) Final   POC Secobarbital (BAR) 04/08/2024 None Detected  NONE DETECTED (Cut Off Level 300 ng/mL) Final   POC Buprenorphine (BUP) 04/08/2024 None Detected  NONE DETECTED (Cut Off Level 10 ng/mL) Final   POC Oxazepam (BZO) 04/08/2024 None Detected  NONE DETECTED (Cut Off Level 300 ng/mL) Final   POC Cocaine UR 04/08/2024 None Detected  NONE DETECTED (Cut Off Level 300 ng/mL) Final   POC Methamphetamine UR 04/08/2024 None Detected  NONE DETECTED (Cut Off Level 1000 ng/mL) Final   POC Morphine  04/08/2024 None Detected  NONE DETECTED (Cut Off Level 300 ng/mL) Final   POC Methadone UR 04/08/2024 None Detected  NONE DETECTED (Cut Off Level 300 ng/mL) Final   POC Oxycodone  UR 04/08/2024 None Detected  NONE DETECTED (Cut Off Level 100 ng/mL) Final   POC  Marijuana UR 04/08/2024 Positive (A)  NONE DETECTED (Cut Off Level 50 ng/mL) Final    Blood Alcohol level:  Lab Results  Component Value Date   ETH 140 (H) 04/08/2024   ETH <15 08/17/2023    Metabolic Disorder Labs: Lab Results  Component Value Date   HGBA1C 5.2 04/08/2024   MPG 102.54 04/08/2024   No results found for: PROLACTIN Lab Results  Component Value Date   CHOL 190 04/08/2024   TRIG 85 04/08/2024  HDL 83 04/08/2024   CHOLHDL 2.3 04/08/2024   VLDL 17 04/08/2024   LDLCALC 90 04/08/2024    Therapeutic Lab Levels: No results found for: LITHIUM No results found for: VALPROATE Lab Results  Component Value Date   CBMZ 7.5 03/28/2014    Physical Findings   AUDIT    Flowsheet Row ED from 04/09/2024 in [facility]  Alcohol Use Disorder Identification Test Final Score (AUDIT) 31   PHQ2-9    Flowsheet Row ED from 04/09/2024 in [facility] ED from 04/08/2024 in [facility]  PHQ-2 Total Score 6 2  PHQ-9 Total Score 24 8   Flowsheet Row ED from 04/09/2024 in [facility] ED from 04/08/2024 in [facility] ED to Hosp-Admission (Discharged) from 08/17/2023 in [facility]  SURGICAL  C-SSRS RISK CATEGORY No Risk No Risk No Risk     Musculoskeletal  Strength & Muscle Tone: within normal limits Gait & Station: normal Patient leans: N/A  Psychiatric Specialty Exam  Presentation  General Appearance:  Appropriate for Environment; Casual  Eye Contact: Good  Speech: Clear and Coherent; Normal Rate  Speech Volume: Normal  Handedness: Right   Mood and Affect  Mood: Depressed  Affect: Depressed   Thought Process  Thought Processes: Coherent; Goal Directed; Linear  Descriptions of Associations:Intact  Orientation:Full (Time, Place and Person)  Thought Content:Logical; WDL   Diagnosis of Schizophrenia or Schizoaffective disorder in past: No    Hallucinations:Hallucinations: None  Ideas of Reference:None  Suicidal Thoughts:Suicidal Thoughts: No  Homicidal Thoughts:Homicidal Thoughts: No   Sensorium  Memory: Immediate Fair  Judgment: Fair  Insight: Fair   Art Therapist  Concentration: Fair  Attention Span: Poor  Recall: Fair  Fund of Knowledge: Good  Language: Good   Psychomotor Activity  Psychomotor Activity: Psychomotor Activity: Normal   Assets  Assets: Communication Skills; Desire for Improvement; Financial Resources/Insurance; Social Support   Sleep  Sleep: Sleep: Fair  Estimated Sleeping Duration (Last 24 Hours): 8.25-9.75 hours  No data recorded  Physical Exam  Physical Exam ROS Blood pressure 109/69, pulse 81, temperature (!) 95.5 F (35.3 C), temperature source Oral, resp. rate 17, SpO2 100%. There is no height or weight on file to calculate BMI.  Treatment Plan Summary: Daily contact with patient to assess and evaluate symptoms and progress in treatment and Medication management  Patient is a 58 year old female with the above-stated past psychiatric history who is seen in follow-up.  Chart reviewed. Patient discussed with nursing. Patient is being monitored for the symptoms of acute alcohol withdrawal and mood stability.  PLAN:  Safety and Monitoring: continue FBC admission; 15-minute checks; daily contact with patient to assess and evaluate symptoms and progress in treatment; psychoeducation.Vital signs: q12 hours.   Medications:  Addiction: CIWA Librium  protocol initiated and continued today: -Loperamide  2 to 4 mg oral as needed/diarrhea or loose stools -Chlordiazepoxide  25 mg 4 times daily x4 doses, 1 mg 3 times daily x3 doses, 1 mg 2 times daily x2 doses, 1 mg daily x1 dose -Chlordiazepoxide  25 mg every 6 hours as needed CIWA greater than 10 -Ondansetron  disintegrating tablet 4 mg every 6  as needed/nausea or vomiting -Thiamine  injection 100 mg IM once -Thiamine  tablet 100 mg daily - restart Naltrexone  50mg  PO daily for alcohol cravings -Nicotine  patch 21 mg transdermal daily  Bipolar disorder, depression, anxiety, insomnia: -restart Cymbalta  30mg  po daily for depression, anxiety, pain -continue Risperidone  1mg   PO BID for bipolar disorder -start Hydroxyzine  25mg  PO TID PRN anxiety. -continue Trazodone  50 mg nightly, as needed/sleep  Other medical conditions: -Aspirin  chewable 81 mg daily -Atorvastatin  40 mg nightly -Calcium  carbonate (Tums) chewable tablet 200 mg 3 times daily as needed/indigestion or heartburn -Folic acid  1 mg daily -Gabapentin  600 mg 3 times daily -Losartan  50 mg daily -Methocarbamol  750 mg 3 times daily as needed/muscle spasm -Metoprolol  to tartrate 50 mg twice daily -Multivitamin with minerals 1 tablet daily -Pantoprazole  EC 80 mg daily -Vitamin D  (ergocalciferol ) 1.25 mg 50,000 units oral every Saturday  -Acetaminophen  650 mg every 6 hours, as needed/mild pain -Maalox 30 mL oral every 4 hours, as needed/digestion -Magnesium  hydroxide 30 mL daily as needed/mild constipation - Tylenol  alternate with Ibuprofen  PRN pain.   Agitation Protocol continues     Discharge Planning: Disposition/case management to assist with discharge planning and identification of follow-up needs including residential or outpatient substance use treatment options as needed, prior to discharge Discharge Concerns: Need to confirm safety plan; Medication compliance and effectiveness Discharge Goals: Return home with outpatient referrals for mental health follow-up including medication management/psychotherapy and substance use treatment follow-up resources as appropriate  Total Time Spent in Direct Patient Care:  I personally spent 35 minutes on the unit in direct patient care. The direct patient care time included face-to-face time with the patient, reviewing the  patient's chart, communicating with other professionals, and coordinating care. Greater than 50% of this time was spent in counseling or coordinating care with the patient regarding goals of hospitalization, psycho-education, and discharge planning needs.    Neil Appl, MD 04/10/2024 12:39 PM

## 2024-04-10 NOTE — ED Notes (Signed)
 Pt sleeping long periods through night, maintained on CIWA protocol.  No complaints of discomfort of distress. Safety maintained.

## 2024-04-10 NOTE — Group Note (Signed)
 Group Topic: Social Support  Group Date: 04/10/2024 Start Time: 1300 End Time: 1400 Facilitators: Orlando Paulla SAILOR, NT  Department: [facility]  Number of Participants: 4  Group Focus: social skills Treatment Modality:  Solution-Focused Therapy Interventions utilized were support Purpose: reinforce self-care  Name: Tiffany [name] Date of Birth: 26-Feb-1966  MR: [id number]    Level of Participation: active Quality of Participation: attentive Interactions with others: gave feedback Mood/Affect: appropriate Triggers (if applicable): n/a Cognition: coherant  Progress: Gaining insight Response: n/a Plan: patient will be encouraged to attend more meetings   Patients Problems:  Patient Active Problem List   Diagnosis Date Noted   Alcohol use disorder 04/09/2024   Essential hypertension 08/18/2023   Chronic hyponatremia 11/06/2022   Polysubstance abuse (HCC) 11/06/2022   Colitis 11/06/2022   Hypomagnesemia 11/06/2022   Bizarre behavior 04/01/2014   Hypersexuality    Bipolar disorder, current episode mixed, moderate (HCC)    Bipolar disorder (HCC) 12/04/2013   Delusional disorder (HCC) 12/01/2013   Bipolar disorder, unspecified (HCC) 11/30/2013   Substance induced mood disorder (HCC) 11/30/2013   Hepatitis C 01/08/2013   Alcohol abuse 03/28/2011   Psychosis (HCC) 03/24/2011   Hyponatremia 03/23/2011   Hypokalemia 03/23/2011   Depression 03/23/2011   Anxiety 03/23/2011   Tobacco abuse 03/23/2011   CERVICALGIA 08/21/2007   CELLULITIS 05/18/2007   EPIDERMOID CYST 05/18/2007   DYSPEPSIA 03/05/2007

## 2024-04-10 NOTE — ED Notes (Signed)
 Pt reports feeling crappy, having pain all over, and having anxiety. Pt says her tailbone hurts from 'having to sit the way I did at [facility]'. Provider notified about pain and anxiety. Prn tylenol  and atarax  given. Pt presents as depressed and somewhat whiny in vocal tone. Pt also reported to be having hot flashes. Pt denied si hi and avh, verbal contract for safety provided. Medications reviewed, questions denied.

## 2024-04-10 NOTE — Group Note (Signed)
 Group Topic: Wellness  Group Date: 04/10/2024 Start Time: 1200 End Time: 1230 Facilitators: Daved Tinnie HERO, RN  Department: [facility]  Number of Participants: 7  Group Focus: check in Treatment Modality:  Psychoeducation Interventions utilized were patient education Purpose: increase insight  Name: Tiffany [name] Date of Birth: [birth year]  MR: [id number]    Level of Participation: moderate Quality of Participation: cooperative Interactions with others: gave feedback Mood/Affect: appropriate Triggers (if applicable): n/a Cognition: coherent/clear Progress: Gaining insight Response: RN reviewed pt medications, pt verbalized understanding  Plan: patient will be encouraged to attend future RN education groups and discuss medications as needed.   Patients Problems:  Patient Active Problem List   Diagnosis Date Noted   Alcohol use disorder 04/09/2024   Essential hypertension 08/18/2023   Chronic hyponatremia 11/06/2022   Polysubstance abuse (HCC) 11/06/2022   Colitis 11/06/2022   Hypomagnesemia 11/06/2022   Bizarre behavior 04/01/2014   Hypersexuality    Bipolar disorder, current episode mixed, moderate (HCC)    Bipolar disorder (HCC) 12/04/2013   Delusional disorder (HCC) 12/01/2013   Bipolar disorder, unspecified (HCC) 11/30/2013   Substance induced mood disorder (HCC) 11/30/2013   Hepatitis C 01/08/2013   Alcohol abuse 03/28/2011   Psychosis (HCC) 03/24/2011   Hyponatremia 03/23/2011   Hypokalemia 03/23/2011   Depression 03/23/2011   Anxiety 03/23/2011   Tobacco abuse 03/23/2011   CERVICALGIA 08/21/2007   CELLULITIS 05/18/2007   EPIDERMOID CYST 05/18/2007   DYSPEPSIA 03/05/2007

## 2024-04-10 NOTE — ED Notes (Signed)
 Pt c/o pain all over, including L tooth and ear, also tailbone, provider notified. Pt c/o anxiety, provider notified, pt administered prn atarax .

## 2024-04-10 NOTE — Care Management (Signed)
 [facility] Care Management...  Clinical Research Associate received a phone call from [facility].  Fellowship Shona reports she has medicaid and Medicare and they are not in network with her insurance.  Without insurance the cost for treatment would be $27,000.  Writer informed them she does not have the funds to cover the cost.    Clinical Research Associate will continue to follow up with other referrals agencies such as [facility] and [facility] [place].

## 2024-04-11 DIAGNOSIS — K0889 Other specified disorders of teeth and supporting structures: Secondary | ICD-10-CM | POA: Diagnosis not present

## 2024-04-11 NOTE — ED Notes (Signed)
 Patient is in the bedroom calm and sleeping, NAD

## 2024-04-11 NOTE — Group Note (Signed)
 Group Topic: Relapse and Recovery  Group Date: 04/11/2024 Start Time: 8069 End Time: 2000 Facilitators: Verdon Jacqualyn BRAVO, NT  Department: [facility]  Number of Participants: 9  Group Focus: diagnosis education Treatment Modality:  Psychoeducation Interventions utilized were group exercise Purpose: express feelings  Name: Tiffany [name] Date of Birth: 02-05-1966  MR: [id number]    Level of Participation: active Quality of Participation: cooperative Interactions with others: gave feedback Mood/Affect: appropriate Triggers (if applicable): n/a Cognition: coherent/clear Progress: Moderate Response: n/a Plan: follow-up needed  Patients Problems:  Patient Active Problem List   Diagnosis Date Noted   Alcohol use disorder 04/09/2024   Essential hypertension 08/18/2023   Chronic hyponatremia 11/06/2022   Polysubstance abuse (HCC) 11/06/2022   Colitis 11/06/2022   Hypomagnesemia 11/06/2022   Bizarre behavior 04/01/2014   Hypersexuality    Bipolar disorder, current episode mixed, moderate (HCC)    Bipolar disorder (HCC) 12/04/2013   Delusional disorder (HCC) 12/01/2013   Bipolar disorder, unspecified (HCC) 11/30/2013   Substance induced mood disorder (HCC) 11/30/2013   Hepatitis C 01/08/2013   Alcohol abuse 03/28/2011   Psychosis (HCC) 03/24/2011   Hyponatremia 03/23/2011   Hypokalemia 03/23/2011   Depression 03/23/2011   Anxiety 03/23/2011   Tobacco abuse 03/23/2011   CERVICALGIA 08/21/2007   CELLULITIS 05/18/2007   EPIDERMOID CYST 05/18/2007   DYSPEPSIA 03/05/2007

## 2024-04-11 NOTE — Group Note (Signed)
 Group Topic: Recovery Basics  Group Date: 04/11/2024 Start Time: 1200 End Time: 1230 Facilitators: [place] Dodgen, Zane HERO, RN  Department: [facility]  Number of Participants: 1  Group Focus: nursing group Treatment Modality:  Individual Therapy Interventions utilized were patient education Purpose: increase insight  Name: Tiffany [name] Date of Birth: 1965-09-11  MR: [id number]    Level of Participation: moderate Quality of Participation: cooperative Interactions with others: gave feedback Mood/Affect: appropriate Triggers (if applicable): None identified at this time Cognition: coherent/clear and logical Progress: Gaining insight Response: Patient voiced understanding of medications and treatment plan. Voices understanding of who to speak with in case further questions arise. Plan: patient will be encouraged to continue to attend groups/programming on the unit  Patients Problems:  Patient Active Problem List   Diagnosis Date Noted   Alcohol use disorder 04/09/2024   Essential hypertension 08/18/2023   Chronic hyponatremia 11/06/2022   Polysubstance abuse (HCC) 11/06/2022   Colitis 11/06/2022   Hypomagnesemia 11/06/2022   Bizarre behavior 04/01/2014   Hypersexuality    Bipolar disorder, current episode mixed, moderate (HCC)    Bipolar disorder (HCC) 12/04/2013   Delusional disorder (HCC) 12/01/2013   Bipolar disorder, unspecified (HCC) 11/30/2013   Substance induced mood disorder (HCC) 11/30/2013   Hepatitis C 01/08/2013   Alcohol abuse 03/28/2011   Psychosis (HCC) 03/24/2011   Hyponatremia 03/23/2011   Hypokalemia 03/23/2011   Depression 03/23/2011   Anxiety 03/23/2011   Tobacco abuse 03/23/2011   CERVICALGIA 08/21/2007   CELLULITIS 05/18/2007   EPIDERMOID CYST 05/18/2007   DYSPEPSIA 03/05/2007

## 2024-04-11 NOTE — ED Notes (Signed)
 Patient alert & oriented x4. Denies intent to harm self or others when asked. Denies A/VH. Patient denies any physical complaints when asked. Scheduled medications administered with no complications. No acute distress noted. Support and encouragement provided. Patient observed in milieu. No inappropriate behaviors observed or reported. Routine safety checks conducted per facility protocol. Encouraged patient to notify staff if any thoughts of harm towards self or others arise. Patient verbalizes understanding and agreement.

## 2024-04-11 NOTE — Care Plan (Signed)
 Interdisciplinary Treatment and Diagnostic Plan Update  04/11/2024 Time of Session: 2:17pm BRANDICE BUSSER MRN: [id number]  Diagnosis:  Final diagnoses:  Alcohol use disorder [F10.90]     Current Medications:  Current Facility-Administered Medications  Medication Dose Route Frequency Provider Last Rate Last Admin   acetaminophen  (TYLENOL ) tablet 650 mg  650 mg Oral Q6H PRN Dasie Ellouise CROME, FNP   650 mg at 04/11/24 9361   alum & mag hydroxide-simeth (MAALOX/MYLANTA) 200-200-20 MG/5ML suspension 30 mL  30 mL Oral Q4H PRN Dasie Ellouise CROME, FNP       aspirin  chewable tablet 81 mg  81 mg Oral Daily Dasie Ellouise CROME, FNP   81 mg at 04/11/24 1119   atorvastatin  (LIPITOR) tablet 40 mg  40 mg Oral QHS Allen, Tina L, FNP   40 mg at 04/09/24 2142   calcium  carbonate (TUMS - dosed in mg elemental calcium ) chewable tablet 200 mg of elemental calcium   1 tablet Oral TID PRN Dasie Ellouise CROME, FNP       chlordiazePOXIDE  (LIBRIUM ) capsule 25 mg  25 mg Oral Q6H PRN Dasie Ellouise CROME, FNP   25 mg at 04/10/24 2141   chlordiazePOXIDE  (LIBRIUM ) capsule 25 mg  25 mg Oral TID Dasie Ellouise CROME, FNP   25 mg at 04/11/24 1120   Followed by   chlordiazePOXIDE  (LIBRIUM ) capsule 25 mg  25 mg Oral Letha Dasie Ellouise CROME, FNP       Followed by   NOREEN ON 04/12/2024] chlordiazePOXIDE  (LIBRIUM ) capsule 25 mg  25 mg Oral Daily Dasie Ellouise CROME, FNP       DULoxetine  (CYMBALTA ) DR capsule 30 mg  30 mg Oral Daily Paliy, Alisa, MD   30 mg at 04/11/24 1128   folic acid  (FOLVITE ) tablet 1 mg  1 mg Oral Daily Dasie Ellouise CROME, FNP   1 mg at 04/11/24 1119   gabapentin  (NEURONTIN ) capsule 600 mg  600 mg Oral TID Allen, Tina L, FNP   600 mg at 04/11/24 1119   hydrOXYzine  (ATARAX ) tablet 25 mg  25 mg Oral Q6H PRN Paliy, Alisa, MD   25 mg at 04/11/24 1259   ibuprofen  (ADVIL ) tablet 400 mg  400 mg Oral Q6H PRN Paliy, Alisa, MD       loperamide  (IMODIUM ) capsule 2-4 mg  2-4 mg Oral PRN Dasie Ellouise CROME, FNP       losartan  (COZAAR ) tablet 50 mg  50 mg Oral  Daily Allen, Tina L, FNP   50 mg at 04/11/24 1119   magnesium  hydroxide (MILK OF MAGNESIA) suspension 30 mL  30 mL Oral Daily PRN Allen, Tina L, FNP       methocarbamol  (ROBAXIN ) tablet 750 mg  750 mg Oral TID PRN Allen, Tina L, FNP   750 mg at 04/10/24 9387   metoprolol  tartrate (LOPRESSOR ) tablet 50 mg  50 mg Oral BID Allen, Tina L, FNP   50 mg at 04/11/24 1119   multivitamin with minerals tablet 1 tablet  1 tablet Oral Daily Dasie Ellouise CROME, FNP   1 tablet at 04/11/24 1119   naltrexone  (DEPADE) tablet 50 mg  50 mg Oral Daily Paliy, Alisa, MD   50 mg at 04/11/24 1119   nicotine  (NICODERM CQ  - dosed in mg/24 hours) patch 21 mg  21 mg Transdermal Daily Dasie Ellouise CROME, FNP   21 mg at 04/11/24 1128   OLANZapine  (ZYPREXA ) injection 5 mg  5 mg Intramuscular TID PRN Dasie Ellouise CROME, FNP  ondansetron  (ZOFRAN -ODT) disintegrating tablet 4 mg  4 mg Oral Q6H PRN Dasie Ellouise CROME, FNP       pantoprazole  (PROTONIX ) EC tablet 80 mg  80 mg Oral Daily Dasie Ellouise CROME, FNP   80 mg at 04/11/24 1119   risperiDONE  (RISPERDAL ) tablet 1 mg  1 mg Oral BID Dasie Ellouise CROME, FNP   1 mg at 04/11/24 1119   thiamine  (VITAMIN B1) tablet 100 mg  100 mg Oral Daily Dasie Ellouise CROME, FNP   100 mg at 04/11/24 1119   traZODone  (DESYREL ) tablet 50 mg  50 mg Oral QHS PRN Dasie Ellouise CROME, FNP       [START ON 04/13/2024] Vitamin D  (Ergocalciferol ) (DRISDOL ) 1.25 MG (50000 UNIT) capsule 50,000 Units  50,000 Units Oral Q Sat Dasie Ellouise CROME, FNP       Current Outpatient Medications  Medication Sig Dispense Refill   aspirin  81 MG chewable tablet Chew by mouth daily.     atorvastatin  (LIPITOR) 40 MG tablet Take 40 mg by mouth at bedtime.     calcium  carbonate (TUMS - DOSED IN MG ELEMENTAL CALCIUM ) 500 MG chewable tablet Chew 1 tablet by mouth 3 (three) times daily as needed for indigestion or heartburn.     clopidogrel  (PLAVIX ) 75 MG tablet Take 75 mg by mouth daily.     dexmethylphenidate (FOCALIN XR) 10 MG 24 hr capsule Take 10 mg by mouth daily.      dexmethylphenidate (FOCALIN) 5 MG tablet Take 5 mg by mouth 2 (two) times daily.     diphenhydrAMINE  (BENADRYL ) 25 mg capsule Take 25 mg by mouth daily as needed for itching.     DULoxetine  HCl 60 MG CSDR Take 120 mg by mouth daily.     Ferrous Sulfate (IRON PO) Take 1 tablet by mouth daily as needed (For iron supplement).     fluticasone  (FLONASE ) 50 MCG/ACT nasal spray Place 2 sprays into both nostrils in the morning and at bedtime.     folic acid  (FOLVITE ) 1 MG tablet Take 1 mg by mouth daily.     gabapentin  (NEURONTIN ) 600 MG tablet Take 600 mg by mouth 3 (three) times daily.     hydrochlorothiazide (HYDRODIURIL) 25 MG tablet Take 25 mg by mouth daily.     hydrOXYzine  (VISTARIL ) 50 MG capsule Take 50 mg by mouth at bedtime.     LORazepam  (ATIVAN ) 1 MG tablet Take 1 mg by mouth every 8 (eight) hours.     losartan  (COZAAR ) 50 MG tablet Take 50 mg by mouth daily.     MAGNESIUM  PO Take 1 tablet by mouth daily as needed (For magnesium  supplement).     methocarbamol  (ROBAXIN ) 750 MG tablet Take 750 mg by mouth 3 (three) times daily as needed for muscle spasms.     metoprolol  tartrate (LOPRESSOR ) 50 MG tablet Take 50 mg by mouth 2 (two) times daily.     naltrexone  (DEPADE) 50 MG tablet Take 50 mg by mouth daily.     nicotine  (NICODERM CQ  - DOSED IN MG/24 HOURS) 21 mg/24hr patch Place 21 mg onto the skin daily.     omeprazole  (PRILOSEC) 40 MG capsule Take 40 mg by mouth daily.     ondansetron  (ZOFRAN ) 4 MG tablet Take 4-8 mg by mouth every 8 (eight) hours as needed for nausea.     risperiDONE  (RISPERDAL ) 1 MG tablet Take 1 mg by mouth 2 (two) times daily.     thiamine  (VITAMIN B1) 100 MG tablet Take 100 mg  by mouth daily.     Vitamin D , Ergocalciferol , (DRISDOL ) 1.25 MG (50000 UNIT) CAPS capsule Take 50,000 Units by mouth every Saturday.     PTA Medications: Prior to Admission medications  Medication Sig Start Date End Date Taking? Authorizing Provider  aspirin  81 MG chewable tablet Chew by  mouth daily.    [provider]  atorvastatin  (LIPITOR) 40 MG tablet Take 40 mg by mouth at bedtime. 11/07/21   [provider]  calcium  carbonate (TUMS - DOSED IN MG ELEMENTAL CALCIUM ) 500 MG chewable tablet Chew 1 tablet by mouth 3 (three) times daily as needed for indigestion or heartburn.    [provider]  clopidogrel  (PLAVIX ) 75 MG tablet Take 75 mg by mouth daily. 11/08/21   [provider]  dexmethylphenidate (FOCALIN XR) 10 MG 24 hr capsule Take 10 mg by mouth daily.    [provider]  dexmethylphenidate (FOCALIN) 5 MG tablet Take 5 mg by mouth 2 (two) times daily.    [provider]  diphenhydrAMINE  (BENADRYL ) 25 mg capsule Take 25 mg by mouth daily as needed for itching.    [provider]  DULoxetine  HCl 60 MG CSDR Take 120 mg by mouth daily. 11/01/22   [provider]  Ferrous Sulfate (IRON PO) Take 1 tablet by mouth daily as needed (For iron supplement).    [provider]  fluticasone  (FLONASE ) 50 MCG/ACT nasal spray Place 2 sprays into both nostrils in the morning and at bedtime.    [provider]  folic acid  (FOLVITE ) 1 MG tablet Take 1 mg by mouth daily. 04/07/24 07/06/24  [provider]  gabapentin  (NEURONTIN ) 600 MG tablet Take 600 mg by mouth 3 (three) times daily. 08/20/19   [provider]  hydrochlorothiazide (HYDRODIURIL) 25 MG tablet Take 25 mg by mouth daily.    [provider]  hydrOXYzine  (VISTARIL ) 50 MG capsule Take 50 mg by mouth at bedtime.    [provider]  LORazepam  (ATIVAN ) 1 MG tablet Take 1 mg by mouth every 8 (eight) hours.    [provider]  losartan  (COZAAR ) 50 MG tablet Take 50 mg by mouth daily. 10/03/23   [provider]  MAGNESIUM  PO Take 1 tablet by mouth daily as needed (For magnesium  supplement).    [provider]  methocarbamol  (ROBAXIN ) 750 MG tablet Take 750 mg by mouth 3 (three) times daily as  needed for muscle spasms. 04/07/24 07/06/24  [provider]  metoprolol  tartrate (LOPRESSOR ) 50 MG tablet Take 50 mg by mouth 2 (two) times daily. 09/19/22   [provider]  naltrexone  (DEPADE) 50 MG tablet Take 50 mg by mouth daily. 08/20/19   [provider]  nicotine  (NICODERM CQ  - DOSED IN MG/24 HOURS) 21 mg/24hr patch Place 21 mg onto the skin daily. 04/08/24 05/08/24  [provider]  omeprazole  (PRILOSEC) 40 MG capsule Take 40 mg by mouth daily.    [provider]  ondansetron  (ZOFRAN ) 4 MG tablet Take 4-8 mg by mouth every 8 (eight) hours as needed for nausea.    [provider]  risperiDONE  (RISPERDAL ) 1 MG tablet Take 1 mg by mouth 2 (two) times daily. 08/20/19   [provider]  thiamine  (VITAMIN B1) 100 MG tablet Take 100 mg by mouth daily. 04/07/24 07/06/24  [provider]  Vitamin D , Ergocalciferol , (DRISDOL ) 1.25 MG (50000 UNIT) CAPS capsule Take 50,000 Units by mouth every Saturday. 11/14/23   [provider]  Patient Stressors: Marital or family conflict   Substance abuse    Patient Strengths: Capable of independent living  Motivation for treatment/growth  Supportive family/friends   Treatment Modalities: Medication Management, Group therapy, Case management,  1 to 1 session with clinician, Psychoeducation, Recreational therapy.   Physician Treatment Plan for Primary and Secondary Diagnosis:  Final diagnoses:  Alcohol use disorder [F10.90]   Long Term Goal(s):    Short Term Goals:    Medication Management: Evaluate patient's response, side effects, and tolerance of medication regimen.  Therapeutic Interventions: 1 to 1 sessions, Unit Group sessions and Medication administration.  Evaluation of Outcomes: Progressing  LCSW Treatment Plan for Primary Diagnosis:  Final diagnoses:  Alcohol use disorder [F10.90]    Long Term Goal(s): Safe transition to appropriate next level of care at  discharge.  Short Term Goals: Facilitate acceptance of mental health diagnosis and concerns through verbal commitment to aftercare plan and appointments at discharge.  Therapeutic Interventions: Assess for all discharge needs, 1 to 1 time with Child psychotherapist, Explore available resources and support systems, Assess for adequacy in community support network, Educate family and significant other(s) on suicide prevention, Complete Psychosocial Assessment, Interpersonal group therapy.  Evaluation of Outcomes: Progressing   Progress in Treatment: Attending groups: Yes. Participating in groups: Yes. Taking medication as prescribed: Yes. Toleration medication: Yes.  Client is currently on a librium  taper.   Family/Significant other contact made: Yes, individual(s) contacted:  Darin Raring Patient understands diagnosis: Yes. Discussing patient identified problems/goals with staff: Yes. Medical problems stabilized or resolved: Yes. Denies suicidal/homicidal ideation: Yes. Issues/concerns per patient self-inventory: No. Other: NA  New problem(s) identified: No, Describe:  ]na  New Short Term/Long Term Goal(s):  Client reports she wants to go to [facility] treatment [facility] by Monday.    Patient Goals:  gain sobriety/decrease alcohol intake after leaving detox and residential treatment.   Discharge Plan or Barriers: Client reports no barriers.   Reason for Continuation of Hospitalization: Depression Medication stabilization Withdrawal symptoms  Estimated Length of Stay:04/09/24 -04/13/24  Last 3 [place] Suicide Severity Risk Score: Flowsheet Row ED from 04/09/2024 in [facility] ED from 04/08/2024 in [facility] ED to Hosp-Admission (Discharged) from 08/17/2023 in [facility]  SURGICAL  C-SSRS RISK CATEGORY No Risk No Risk No Risk    Last [facility] 2/9 Scores:    04/09/2024    5:57 PM 04/08/2024    6:47 PM   Depression screen PHQ 2/9  Decreased Interest 3 1  Down, Depressed, Hopeless 3 1  PHQ - 2 Score 6 2  Altered sleeping 3 1  Tired, decreased energy 3 1  Change in appetite 3 1  Feeling bad or failure about yourself  3 1  Trouble concentrating 3 1  Moving slowly or fidgety/restless 3 1  Suicidal thoughts 0 0  PHQ-9 Score 24 8  Difficult doing work/chores Extremely dIfficult Very difficult    Scribe for Treatment Team: Railee Bonillas, LCSW 04/11/2024 2:17 PM

## 2024-04-11 NOTE — ED Notes (Signed)
 Patient is in the dayroom calm and composed, watching TV with other patients. NAD.Denies SI/HI Environment secured per policy. Respirations even and unlabored. Will monitor for safety.

## 2024-04-11 NOTE — ED Notes (Signed)
 Pt stated that she was not coming to dinner

## 2024-04-11 NOTE — ED Notes (Signed)
 Pt bedresting with eyes closed , observed to turn and reposition upon rounds . Maintained on CIWA protocol. Safety maintained.

## 2024-04-11 NOTE — ED Provider Notes (Signed)
 Behavioral Health Progress Note  Date and Time: 04/11/2024 11:36 AM Name: Tiffany [name] MRN:  [id number]  Patient is a  58y.o. female who presents to the [facility] unit due to alcohol abuse and requesting detox and referral to residential substance use treatment program.  Interval History Patient was seen today for re-evaluation.  Nursing reports that patient complained of various body aches overnight. The patient has no issues with performing ADLs.  Patient has been medication compliant.    Patient was seen and interviewed by attending psychiatrist. Chart reviewed. Patient discussed during treatment team rounds.  Subjective:  On assessment patient reports I am feeling pretty good today and denies any acute mental or physical complaints. Patient reports no symptoms of withdrawal from any substances; less accohol cravings with Naltrexone . Her mood is pretty good. She denies thoughts or plans of hurting self or others.She denies auditory or visual hallucinations, denies feeling paranoid, unsafe, does not express any delusions.  She reports no side effects from medications she is getting here. She is interested in residential addicton treatment.   Diagnosis:  Final diagnoses:  None    Total Time spent with patient: 30 minutes  Past Psychiatric History: see H&P Past Medical History: see H&P Family History: see H&P Family Psychiatric  History: see H&P Social History:  see H&P  Sleep: Negative  Appetite:  Good  Current Medications:  Current Facility-Administered Medications  Medication Dose Route Frequency Provider Last Rate Last Admin   acetaminophen  (TYLENOL ) tablet 650 mg  650 mg Oral Q6H PRN Dasie Ellouise CROME, FNP   650 mg at 04/11/24 0638   alum & mag hydroxide-simeth (MAALOX/MYLANTA) 200-200-20 MG/5ML suspension 30 mL  30 mL Oral Q4H PRN Dasie Ellouise CROME, FNP       aspirin  chewable tablet 81 mg  81 mg Oral Daily Dasie Ellouise CROME, FNP   81 mg at 04/11/24 1119   atorvastatin  (LIPITOR)  tablet 40 mg  40 mg Oral QHS Allen, Tina L, FNP   40 mg at 04/09/24 2142   calcium  carbonate (TUMS - dosed in mg elemental calcium ) chewable tablet 200 mg of elemental calcium   1 tablet Oral TID PRN Dasie Ellouise CROME, FNP       chlordiazePOXIDE  (LIBRIUM ) capsule 25 mg  25 mg Oral Q6H PRN Dasie Ellouise CROME, FNP   25 mg at 04/10/24 2141   chlordiazePOXIDE  (LIBRIUM ) capsule 25 mg  25 mg Oral TID Dasie Ellouise CROME, FNP   25 mg at 04/11/24 1120   Followed by   chlordiazePOXIDE  (LIBRIUM ) capsule 25 mg  25 mg Oral Letha Dasie Ellouise CROME, FNP       Followed by   NOREEN ON 04/12/2024] chlordiazePOXIDE  (LIBRIUM ) capsule 25 mg  25 mg Oral Daily Dasie Ellouise CROME, FNP       DULoxetine  (CYMBALTA ) DR capsule 30 mg  30 mg Oral Daily Alysabeth Scalia, MD   30 mg at 04/11/24 1128   folic acid  (FOLVITE ) tablet 1 mg  1 mg Oral Daily Dasie Ellouise CROME, FNP   1 mg at 04/11/24 1119   gabapentin  (NEURONTIN ) capsule 600 mg  600 mg Oral TID Dasie Ellouise CROME, FNP   600 mg at 04/11/24 1119   hydrOXYzine  (ATARAX ) tablet 25 mg  25 mg Oral Q6H PRN Cataleyah Colborn, MD   25 mg at 04/11/24 9361   ibuprofen  (ADVIL ) tablet 400 mg  400 mg Oral Q6H PRN Zakhari Fogel, MD       loperamide  (IMODIUM ) capsule 2-4 mg  2-4 mg  Oral PRN Dasie Ellouise CROME, FNP       losartan  (COZAAR ) tablet 50 mg  50 mg Oral Daily Dasie Ellouise CROME, FNP   50 mg at 04/11/24 1119   magnesium  hydroxide (MILK OF MAGNESIA) suspension 30 mL  30 mL Oral Daily PRN Allen, Tina L, FNP       methocarbamol  (ROBAXIN ) tablet 750 mg  750 mg Oral TID PRN Allen, Tina L, FNP   750 mg at 04/10/24 0612   metoprolol  tartrate (LOPRESSOR ) tablet 50 mg  50 mg Oral BID Allen, Tina L, FNP   50 mg at 04/11/24 1119   multivitamin with minerals tablet 1 tablet  1 tablet Oral Daily Dasie Ellouise CROME, FNP   1 tablet at 04/11/24 1119   naltrexone  (DEPADE) tablet 50 mg  50 mg Oral Daily Lars Jeziorski, MD   50 mg at 04/11/24 1119   nicotine  (NICODERM CQ  - dosed in mg/24 hours) patch 21 mg  21 mg Transdermal Daily Dasie Ellouise CROME, FNP    21 mg at 04/11/24 1128   OLANZapine  (ZYPREXA ) injection 5 mg  5 mg Intramuscular TID PRN Dasie Ellouise CROME, FNP       ondansetron  (ZOFRAN -ODT) disintegrating tablet 4 mg  4 mg Oral Q6H PRN Dasie Ellouise CROME, FNP       pantoprazole  (PROTONIX ) EC tablet 80 mg  80 mg Oral Daily Dasie Ellouise CROME, FNP   80 mg at 04/11/24 1119   risperiDONE  (RISPERDAL ) tablet 1 mg  1 mg Oral BID Dasie Ellouise CROME, FNP   1 mg at 04/11/24 1119   thiamine  (VITAMIN B1) tablet 100 mg  100 mg Oral Daily Dasie Ellouise CROME, FNP   100 mg at 04/11/24 1119   traZODone  (DESYREL ) tablet 50 mg  50 mg Oral QHS PRN Dasie Ellouise CROME, FNP       [START ON 04/13/2024] Vitamin D  (Ergocalciferol ) (DRISDOL ) 1.25 MG (50000 UNIT) capsule 50,000 Units  50,000 Units Oral Q Sat Dasie Ellouise CROME, FNP       Current Outpatient Medications  Medication Sig Dispense Refill   aspirin  81 MG chewable tablet Chew by mouth daily.     atorvastatin  (LIPITOR) 40 MG tablet Take 40 mg by mouth at bedtime.     calcium  carbonate (TUMS - DOSED IN MG ELEMENTAL CALCIUM ) 500 MG chewable tablet Chew 1 tablet by mouth 3 (three) times daily as needed for indigestion or heartburn.     clopidogrel  (PLAVIX ) 75 MG tablet Take 75 mg by mouth daily.     dexmethylphenidate (FOCALIN XR) 10 MG 24 hr capsule Take 10 mg by mouth daily.     dexmethylphenidate (FOCALIN) 5 MG tablet Take 5 mg by mouth 2 (two) times daily.     diphenhydrAMINE  (BENADRYL ) 25 mg capsule Take 25 mg by mouth daily as needed for itching.     DULoxetine  HCl 60 MG CSDR Take 120 mg by mouth daily.     Ferrous Sulfate (IRON PO) Take 1 tablet by mouth daily as needed (For iron supplement).     fluticasone  (FLONASE ) 50 MCG/ACT nasal spray Place 2 sprays into both nostrils in the morning and at bedtime.     folic acid  (FOLVITE ) 1 MG tablet Take 1 mg by mouth daily.     gabapentin  (NEURONTIN ) 600 MG tablet Take 600 mg by mouth 3 (three) times daily.     hydrochlorothiazide (HYDRODIURIL) 25 MG tablet Take 25 mg by mouth daily.      hydrOXYzine  (VISTARIL ) 50 MG capsule  Take 50 mg by mouth at bedtime.     LORazepam  (ATIVAN ) 1 MG tablet Take 1 mg by mouth every 8 (eight) hours.     losartan  (COZAAR ) 50 MG tablet Take 50 mg by mouth daily.     MAGNESIUM  PO Take 1 tablet by mouth daily as needed (For magnesium  supplement).     methocarbamol  (ROBAXIN ) 750 MG tablet Take 750 mg by mouth 3 (three) times daily as needed for muscle spasms.     metoprolol  tartrate (LOPRESSOR ) 50 MG tablet Take 50 mg by mouth 2 (two) times daily.     naltrexone  (DEPADE) 50 MG tablet Take 50 mg by mouth daily.     nicotine  (NICODERM CQ  - DOSED IN MG/24 HOURS) 21 mg/24hr patch Place 21 mg onto the skin daily.     omeprazole  (PRILOSEC) 40 MG capsule Take 40 mg by mouth daily.     ondansetron  (ZOFRAN ) 4 MG tablet Take 4-8 mg by mouth every 8 (eight) hours as needed for nausea.     risperiDONE  (RISPERDAL ) 1 MG tablet Take 1 mg by mouth 2 (two) times daily.     thiamine  (VITAMIN B1) 100 MG tablet Take 100 mg by mouth daily.     Vitamin D , Ergocalciferol , (DRISDOL ) 1.25 MG (50000 UNIT) CAPS capsule Take 50,000 Units by mouth every Saturday.      Labs  Lab Results:  Admission on 04/08/2024, Discharged on 04/09/2024  Component Date Value Ref Range Status   WBC 04/08/2024 5.4  4.0 - 10.5 K/uL Final   RBC 04/08/2024 4.08  3.87 - 5.11 MIL/uL Final   Hemoglobin 04/08/2024 13.1  12.0 - 15.0 g/dL Final   HCT 87/77/7974 37.1  36.0 - 46.0 % Final   MCV 04/08/2024 90.9  80.0 - 100.0 fL Final   MCH 04/08/2024 32.1  26.0 - 34.0 pg Final   MCHC 04/08/2024 35.3  30.0 - 36.0 g/dL Final   RDW 87/77/7974 12.7  11.5 - 15.5 % Final   Platelets 04/08/2024 271  150 - 400 K/uL Final   nRBC 04/08/2024 0.0  0.0 - 0.2 % Final   Neutrophils Relative % 04/08/2024 51  % Final   Neutro Abs 04/08/2024 2.8  1.7 - 7.7 K/uL Final   Lymphocytes Relative 04/08/2024 33  % Final   Lymphs Abs 04/08/2024 1.8  0.7 - 4.0 K/uL Final   Monocytes Relative 04/08/2024 13  % Final   Monocytes  Absolute 04/08/2024 0.7  0.1 - 1.0 K/uL Final   Eosinophils Relative 04/08/2024 2  % Final   Eosinophils Absolute 04/08/2024 0.1  0.0 - 0.5 K/uL Final   Basophils Relative 04/08/2024 1  % Final   Basophils Absolute 04/08/2024 0.0  0.0 - 0.1 K/uL Final   Immature Granulocytes 04/08/2024 0  % Final   Abs Immature Granulocytes 04/08/2024 0.02  0.00 - 0.07 K/uL Final   Performed at [facility], [address] [place], [place] [address]   Sodium 04/08/2024 132 (L)  135 - 145 mmol/L Final   Potassium 04/08/2024 3.9  3.5 - 5.1 mmol/L Final   Chloride 04/08/2024 95 (L)  98 - 111 mmol/L Final   CO2 04/08/2024 20 (L)  22 - 32 mmol/L Final   Glucose, Bld 04/08/2024 71  70 - 99 mg/dL Final   Glucose reference range applies only to samples taken after fasting for at least 8 hours.   BUN 04/08/2024 9  6 - 20 mg/dL Final   Creatinine, Ser 04/08/2024 0.64  0.44 -  1.00 mg/dL Final   Calcium  04/08/2024 9.8  8.9 - 10.3 mg/dL Final   Total Protein 87/77/7974 7.4  6.5 - 8.1 g/dL Final   Albumin 87/77/7974 4.8  3.5 - 5.0 g/dL Final   AST 87/77/7974 36  15 - 41 U/L Final   ALT 04/08/2024 26  0 - 44 U/L Final   Alkaline Phosphatase 04/08/2024 83  38 - 126 U/L Final   Total Bilirubin 04/08/2024 0.4  0.0 - 1.2 mg/dL Final   GFR, Estimated 04/08/2024 >60  >60 mL/min Final   Comment: (NOTE) Calculated using the CKD-EPI Creatinine Equation (2021)    Anion gap 04/08/2024 17 (H)  5 - 15 Final   Performed at [facility], [address] [place], [place] [address]   Magnesium  04/08/2024 1.8  1.7 - 2.4 mg/dL Final   Performed at [facility], [address], [place], [place] [address]   Hgb A1c MFr Bld 04/08/2024 5.2  4.8 - 5.6 % Final   Comment: (NOTE) Diagnosis of Diabetes The following HbA1c ranges recommended by the American Diabetes Association (ADA) may be used as an aid in the diagnosis of diabetes mellitus.  Hemoglobin             Suggested A1C NGSP%              Diagnosis  <5.7                    Non Diabetic  5.7-6.4                Pre-Diabetic  >6.4                   Diabetic  <7.0                   Glycemic control for                       adults with diabetes.     Mean Plasma Glucose 04/08/2024 102.54  mg/dL Final   Performed at [facility], [address], [place], [place] [address]   Alcohol, Ethyl (B) 04/08/2024 140 (H)  <15 mg/dL Final   Comment: (NOTE) For medical purposes only. Performed at [facility], [address] [place], [place] [address]    Cholesterol 04/08/2024 190  0 - 200 mg/dL Final   Comment:        ATP III CLASSIFICATION:  <200     mg/dL   Desirable  799-760  mg/dL   Borderline High  >=759    mg/dL   High           Triglycerides 04/08/2024 85  <150 mg/dL Final   HDL 87/77/7974 83  >40 mg/dL Final   Total CHOL/HDL Ratio 04/08/2024 2.3  RATIO Final   VLDL 04/08/2024 17  0 - 40 mg/dL Final   LDL Cholesterol 04/08/2024 90  0 - 99 mg/dL Final   Comment:        Total Cholesterol/HDL:CHD Risk Coronary Heart Disease Risk Table                     Men   Women  1/2 Average Risk   3.4   3.3  Average Risk       5.0   4.4  2 X Average Risk   9.6   7.1  3 X Average Risk  23.4   11.0  Use the calculated Patient Ratio above and the CHD Risk Table to determine the patient's CHD Risk.        ATP III CLASSIFICATION (LDL):  <100     mg/dL   Optimal  899-870  mg/dL   Near or Above                    Optimal  130-159  mg/dL   Borderline  839-810  mg/dL   High  >809     mg/dL   Very High Performed at [facility], [address] [place], [place] [address]    TSH 04/08/2024 1.160  0.350 - 4.500 uIU/mL Final   Performed at [facility], [address], [place], [place] [address]   Color, Urine 04/08/2024 STRAW (A)  YELLOW Final   APPearance 04/08/2024 CLEAR  CLEAR Final   Specific Gravity, Urine 04/08/2024 1.005  1.005 - 1.030 Final   pH 04/08/2024 6.0  5.0 - 8.0 Final   Glucose, UA 04/08/2024  NEGATIVE  NEGATIVE mg/dL Final   Hgb urine dipstick 04/08/2024 SMALL (A)  NEGATIVE Final   Bilirubin Urine 04/08/2024 NEGATIVE  NEGATIVE Final   Ketones, ur 04/08/2024 NEGATIVE  NEGATIVE mg/dL Final   Protein, ur 87/77/7974 NEGATIVE  NEGATIVE mg/dL Final   Nitrite 87/77/7974 NEGATIVE  NEGATIVE Final   Leukocytes,Ua 04/08/2024 SMALL (A)  NEGATIVE Final   RBC / HPF 04/08/2024 0-5  0 - 5 RBC/hpf Final   WBC, UA 04/08/2024 0-5  0 - 5 WBC/hpf Final   Bacteria, UA 04/08/2024 RARE (A)  NONE SEEN Final   Squamous Epithelial / HPF 04/08/2024 0-5  0 - 5 /HPF Final   Performed at [facility], [address] [place], [place] [address]   POC Amphetamine UR 04/08/2024 None Detected  NONE DETECTED (Cut Off Level 1000 ng/mL) Final   POC Secobarbital (BAR) 04/08/2024 None Detected  NONE DETECTED (Cut Off Level 300 ng/mL) Final   POC Buprenorphine (BUP) 04/08/2024 None Detected  NONE DETECTED (Cut Off Level 10 ng/mL) Final   POC Oxazepam (BZO) 04/08/2024 None Detected  NONE DETECTED (Cut Off Level 300 ng/mL) Final   POC Cocaine UR 04/08/2024 None Detected  NONE DETECTED (Cut Off Level 300 ng/mL) Final   POC Methamphetamine UR 04/08/2024 None Detected  NONE DETECTED (Cut Off Level 1000 ng/mL) Final   POC Morphine  04/08/2024 None Detected  NONE DETECTED (Cut Off Level 300 ng/mL) Final   POC Methadone UR 04/08/2024 None Detected  NONE DETECTED (Cut Off Level 300 ng/mL) Final   POC Oxycodone  UR 04/08/2024 None Detected  NONE DETECTED (Cut Off Level 100 ng/mL) Final   POC Marijuana UR 04/08/2024 Positive (A)  NONE DETECTED (Cut Off Level 50 ng/mL) Final    Blood Alcohol level:  Lab Results  Component Value Date   ETH 140 (H) 04/08/2024   ETH <15 08/17/2023    Metabolic Disorder Labs: Lab Results  Component Value Date   HGBA1C 5.2 04/08/2024   MPG 102.54 04/08/2024   No results found for: PROLACTIN Lab Results  Component Value Date   CHOL 190 04/08/2024   TRIG 85 04/08/2024   HDL 83  04/08/2024   CHOLHDL 2.3 04/08/2024   VLDL 17 04/08/2024   LDLCALC 90 04/08/2024    Therapeutic Lab Levels: No results found for: LITHIUM No results found for: VALPROATE Lab Results  Component Value Date   CBMZ 7.5 03/28/2014    Physical Findings   AUDIT  Flowsheet Row ED from 04/09/2024 in [facility]  Alcohol Use Disorder Identification Test Final Score (AUDIT) 31   PHQ2-9    Flowsheet Row ED from 04/09/2024 in [facility] ED from 04/08/2024 in [facility]  PHQ-2 Total Score 6 2  PHQ-9 Total Score 24 8   Flowsheet Row ED from 04/09/2024 in [facility] ED from 04/08/2024 in [facility] ED to Hosp-Admission (Discharged) from 08/17/2023 in [facility]  SURGICAL  C-SSRS RISK CATEGORY No Risk No Risk No Risk     Musculoskeletal  Strength & Muscle Tone: within normal limits Gait & Station: normal Patient leans: N/A  Psychiatric Specialty Exam  Presentation  General Appearance:  Appropriate for Environment; Casual  Eye Contact: Good  Speech: Clear and Coherent; Normal Rate  Speech Volume: Normal  Handedness: Right   Mood and Affect  Mood: Depressed  Affect: Depressed   Thought Process  Thought Processes: Coherent; Goal Directed; Linear  Descriptions of Associations:Intact  Orientation:Full (Time, Place and Person)  Thought Content:Logical; WDL  Diagnosis of Schizophrenia or Schizoaffective disorder in past: No    Hallucinations:No data recorded  Ideas of Reference:None  Suicidal Thoughts:No data recorded  Homicidal Thoughts:No data recorded   Sensorium  Memory: Immediate Fair  Judgment: Fair  Insight: Fair   Art Therapist  Concentration: Fair  Attention Span: Poor  Recall: Fair  Fund of Knowledge: Good  Language: Good   Psychomotor  Activity  Psychomotor Activity: No data recorded   Assets  Assets: Communication Skills; Desire for Improvement; Financial Resources/Insurance; Social Support   Sleep  Sleep: No data recorded  Estimated Sleeping Duration (Last 24 Hours): 5.25-6.25 hours  No data recorded  Physical Exam  Physical Exam ROS Blood pressure 134/74, pulse 68, temperature 97.8 F (36.6 C), temperature source Oral, resp. rate 18, SpO2 100%. There is no height or weight on file to calculate BMI.  Treatment Plan Summary: Daily contact with patient to assess and evaluate symptoms and progress in treatment and Medication management  Patient is a 58 year old female with the above-stated past psychiatric history who is seen in follow-up.  Chart reviewed. Patient discussed with nursing. Patient is being monitored for the symptoms of acute alcohol withdrawal and mood stability.  PLAN:  Safety and Monitoring: continue FBC admission; 15-minute checks; daily contact with patient to assess and evaluate symptoms and progress in treatment; psychoeducation.Vital signs: q12 hours.   Medications:  Addiction: -CIWA discontinued -Librium  protocol continued today for acute alcohol withdrawal -Gabapentin  600 mg 3 times daily -Thiamine  tablet 100 mg daily for thiamine  deficiency in settings of alcohol use d/o - continue Naltrexone  50mg  PO daily for alcohol cravings -Nicotine  patch 21 mg transdermal daily for tobacco cravings   Bipolar disorder, depression, anxiety, insomnia: -continue Cymbalta  30mg  po daily for depression, anxiety, pain -continue Risperidone  1mg  PO BID for bipolar disorder -continue Hydroxyzine  25mg  PO TID PRN anxiety. -continue Trazodone  50 mg nightly, as needed/sleep  Other medical conditions: -Aspirin  chewable 81 mg daily for HLD -Atorvastatin  40 mg nightly for HLD -Losartan  50 mg daily for HTN -Metoprolol  tartrate 50 mg twice daily for HTN -Multivitamin with minerals 1 tablet daily;  Folic acid  1 mg daily for general health maintenance -Pantoprazole  EC 80 mg daily for GERD -Vitamin D  (ergocalciferol ) 1.25 mg 50,000 units oral every Saturday for vit D deficiency  PRN meds: acetaminophen , alum & mag hydroxide-simeth, calcium  carbonate, chlordiazePOXIDE , hydrOXYzine , ibuprofen , loperamide ,  magnesium  hydroxide, methocarbamol , OLANZapine , ondansetron , traZODone       Discharge Planning: Disposition/case management to assist with discharge planning and identification of follow-up needs including residential or outpatient substance use treatment options as needed, prior to discharge  Total Time Spent in Direct Patient Care:  I personally spent 35 minutes on the unit in direct patient care. The direct patient care time included face-to-face time with the patient, reviewing the patient's chart, communicating with other professionals, and coordinating care. Greater than 50% of this time was spent in counseling or coordinating care with the patient regarding goals of hospitalization, psycho-education, and discharge planning needs.    Neil Appl, MD 04/11/2024 11:36 AM

## 2024-04-11 NOTE — Group Note (Signed)
 Group Topic: Relapse and Recovery  Group Date: 04/11/2024 Start Time: 1100 End Time: 1155 Facilitators: Gerome Jolly, NT  Department: [facility]  Number of Participants: 3  Group Focus: coping skills, managing urges, relapse prevention, and substance abuse education Treatment Modality:  Cognitive Behavioral Therapy and Psychoeducation Interventions utilized were exploration and identifying strategies to manage urges Purpose: explore maladaptive thinking, relapse prevention strategies, and trigger / craving management  Name: Tiffany [name] Date of Birth: September 28, 1965  MR: [id number]    Level of Participation: active Quality of Participation: attentive, cooperative, and engaged Interactions with others: gave feedback Mood/Affect: appropriate Triggers (if applicable): na Cognition: coherent/clear Progress: Moderate Response: Pt how the technique the 5D's can help her in managing her urges to consume alcohol Plan: follow-up needed  Patients Problems:  Patient Active Problem List   Diagnosis Date Noted   Alcohol use disorder 04/09/2024   Essential hypertension 08/18/2023   Chronic hyponatremia 11/06/2022   Polysubstance abuse (HCC) 11/06/2022   Colitis 11/06/2022   Hypomagnesemia 11/06/2022   Bizarre behavior 04/01/2014   Hypersexuality    Bipolar disorder, current episode mixed, moderate (HCC)    Bipolar disorder (HCC) 12/04/2013   Delusional disorder (HCC) 12/01/2013   Bipolar disorder, unspecified (HCC) 11/30/2013   Substance induced mood disorder (HCC) 11/30/2013   Hepatitis C 01/08/2013   Alcohol abuse 03/28/2011   Psychosis (HCC) 03/24/2011   Hyponatremia 03/23/2011   Hypokalemia 03/23/2011   Depression 03/23/2011   Anxiety 03/23/2011   Tobacco abuse 03/23/2011   CERVICALGIA 08/21/2007   CELLULITIS 05/18/2007   EPIDERMOID CYST 05/18/2007   DYSPEPSIA 03/05/2007

## 2024-04-11 NOTE — ED Notes (Signed)
 Pt appears aleep during observation rounds in no acute distress.  Safety Maintained

## 2024-04-11 NOTE — Group Note (Signed)
 Group Topic: Identity and Relationships  Group Date: 04/11/2024 Start Time: 1115 End Time: 1200 Facilitators: Alyse Leilani LABOR, NT  Department: [facility]  Number of Participants: 9  Group Focus: clarity of thought and co-dependency Treatment Modality:  Psychoeducation Interventions utilized were group exercise, patient education, and problem solving Purpose: enhance coping skills, improve communication skills, and regain self-worth  Name: Tiffany [name] Date of Birth: [birth year]  MR: [id number]    Level of Participation: minimal Quality of Participation: restless Interactions with others: gave feedback Mood/Affect: positive Triggers (if applicable): none Cognition: concrete Progress: Minimal Response: Listened Plan: patient will be encouraged to keep coming to group.   Patients Problems:  Patient Active Problem List   Diagnosis Date Noted   Alcohol use disorder 04/09/2024   Essential hypertension 08/18/2023   Chronic hyponatremia 11/06/2022   Polysubstance abuse (HCC) 11/06/2022   Colitis 11/06/2022   Hypomagnesemia 11/06/2022   Bizarre behavior 04/01/2014   Hypersexuality    Bipolar disorder, current episode mixed, moderate (HCC)    Bipolar disorder (HCC) 12/04/2013   Delusional disorder (HCC) 12/01/2013   Bipolar disorder, unspecified (HCC) 11/30/2013   Substance induced mood disorder (HCC) 11/30/2013   Hepatitis C 01/08/2013   Alcohol abuse 03/28/2011   Psychosis (HCC) 03/24/2011   Hyponatremia 03/23/2011   Hypokalemia 03/23/2011   Depression 03/23/2011   Anxiety 03/23/2011   Tobacco abuse 03/23/2011   CERVICALGIA 08/21/2007   CELLULITIS 05/18/2007   EPIDERMOID CYST 05/18/2007   DYSPEPSIA 03/05/2007

## 2024-04-11 NOTE — Group Note (Signed)
 Group Topic: Wellness  Group Date: 04/11/2024 Start Time: 2030 End Time: 2100 Facilitators: Anice Benton LABOR, NT  Department: [facility]  Number of Participants: 5  Group Focus: check in Treatment Modality:  Patient-Centered Therapy Interventions utilized were support Purpose: express feelings  Name: Tiffany [name] Date of Birth: March 25, 1966  MR: [id number]    Level of Participation: active Quality of Participation: cooperative Interactions with others: gave feedback Mood/Affect: appropriate Triggers (if applicable): N/A Cognition: coherent/clear Progress: Moderate Response: good Plan: follow-up needed  Patients Problems:  Patient Active Problem List   Diagnosis Date Noted   Alcohol use disorder 04/09/2024   Essential hypertension 08/18/2023   Chronic hyponatremia 11/06/2022   Polysubstance abuse (HCC) 11/06/2022   Colitis 11/06/2022   Hypomagnesemia 11/06/2022   Bizarre behavior 04/01/2014   Hypersexuality    Bipolar disorder, current episode mixed, moderate (HCC)    Bipolar disorder (HCC) 12/04/2013   Delusional disorder (HCC) 12/01/2013   Bipolar disorder, unspecified (HCC) 11/30/2013   Substance induced mood disorder (HCC) 11/30/2013   Hepatitis C 01/08/2013   Alcohol abuse 03/28/2011   Psychosis (HCC) 03/24/2011   Hyponatremia 03/23/2011   Hypokalemia 03/23/2011   Depression 03/23/2011   Anxiety 03/23/2011   Tobacco abuse 03/23/2011   CERVICALGIA 08/21/2007   CELLULITIS 05/18/2007   EPIDERMOID CYST 05/18/2007   DYSPEPSIA 03/05/2007

## 2024-04-12 ENCOUNTER — Encounter (HOSPITAL_COMMUNITY): Payer: Self-pay | Admitting: Family

## 2024-04-12 ENCOUNTER — Other Ambulatory Visit: Payer: Self-pay

## 2024-04-12 DIAGNOSIS — K0889 Other specified disorders of teeth and supporting structures: Secondary | ICD-10-CM | POA: Diagnosis not present

## 2024-04-12 DIAGNOSIS — F1090 Alcohol use, unspecified, uncomplicated: Secondary | ICD-10-CM | POA: Diagnosis not present

## 2024-04-12 NOTE — Discharge Instructions (Signed)
 FBC Care Management...  Patient requested inpatient treatment   Patient can discharge Saturday 04/13/2024   Writer spoke with Noemi at [employer] coordinated transportation   [employer] will transport patient...   Writer requested pick up by 10:30 am. Driver will call with ETA    [facility] [address],  [place], [place] [address] Phone: [phone]   30 day scripts

## 2024-04-12 NOTE — ED Provider Notes (Signed)
 Behavioral Health Progress Note  Date and Time: 04/12/2024 8:11 AM Name: Tiffany [name] MRN:  [id number]  Patient is a  58y.o. female who presents to the [facility] unit due to alcohol abuse and requesting detox and referral to residential substance use treatment program.  Interval History Patient was seen today for re-evaluation.  Nursing reports that patient complained of various body aches overnight. The patient has no issues with performing ADLs.  Patient has been medication compliant.    Patient was seen and interviewed by attending psychiatrist. Chart reviewed. Patient discussed during treatment team rounds.  Subjective:  On assessment patient reports I am doing good and denies any acute mental or physical complaints. 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  Patient denies symptoms of withdrawal from any substances (we are planning to discontinue CIWA monitoring); less accohol cravings with Naltrexone . She describes her mood as okay. She denies thoughts or plans of hurting self or others.She denies auditory or visual hallucinations, denies feeling paranoid, unsafe, does not express any delusions.  She reports no side effects from medications she is getting here. She is interested in residential addicton treatment and is hopeful to be transferred to [facility] treatment center tomorrow.  Diagnosis:  Final diagnoses:  Alcohol use disorder [F10.90]    Total Time spent with patient: 30 minutes  Past Psychiatric History: see H&P Past Medical History: see H&P Family History: see H&P Family Psychiatric  History: see H&P Social History:  see H&P  Sleep: Negative  Appetite:  Good  Current Medications:  Current Facility-Administered Medications  Medication Dose Route Frequency Provider Last Rate Last Admin   acetaminophen  (TYLENOL ) tablet 650 mg  650 mg Oral Q6H PRN Dasie Ellouise CROME, FNP   650 mg at 04/11/24 0638   alum & mag hydroxide-simeth  (MAALOX/MYLANTA) 200-200-20 MG/5ML suspension 30 mL  30 mL Oral Q4H PRN Dasie Ellouise CROME, FNP       aspirin  chewable tablet 81 mg  81 mg Oral Daily Dasie Ellouise CROME, FNP   81 mg at 04/11/24 1119   atorvastatin  (LIPITOR) tablet 40 mg  40 mg Oral QHS Allen, Tina L, FNP   40 mg at 04/09/24 2142   calcium  carbonate (TUMS - dosed in mg elemental calcium ) chewable tablet 200 mg of elemental calcium   1 tablet Oral TID PRN Dasie Ellouise CROME, FNP       chlordiazePOXIDE  (LIBRIUM ) capsule 25 mg  25 mg Oral Q6H PRN Dasie Ellouise CROME, FNP   25 mg at 04/10/24 2141   chlordiazePOXIDE  (LIBRIUM ) capsule 25 mg  25 mg Oral Letha Dasie Ellouise CROME, FNP   25 mg at 04/11/24 2116   Followed by   chlordiazePOXIDE  (LIBRIUM ) capsule 25 mg  25 mg Oral Daily Dasie Ellouise CROME, FNP       DULoxetine  (CYMBALTA ) DR capsule 30 mg  30 mg Oral Daily Takyah Ciaramitaro, MD   30 mg at 04/11/24 1128   folic acid  (FOLVITE ) tablet 1 mg  1 mg Oral Daily Dasie Ellouise CROME, FNP   1 mg at 04/11/24 1119   gabapentin  (NEURONTIN ) capsule 600 mg  600 mg Oral TID Dasie Ellouise CROME, FNP   600 mg at 04/11/24 2116   hydrOXYzine  (ATARAX ) tablet 25 mg  25 mg Oral Q6H PRN Karita Dralle, MD   25 mg at 04/11/24 1259   ibuprofen  (ADVIL ) tablet 400 mg  400 mg Oral Q6H PRN Evann Koelzer, MD       loperamide  (IMODIUM ) capsule 2-4 mg  2-4 mg Oral PRN Dasie Ellouise CROME, FNP       losartan  (COZAAR ) tablet 50 mg  50 mg Oral Daily Dasie Ellouise CROME, FNP   50 mg at 04/11/24 1119   magnesium  hydroxide (MILK OF MAGNESIA) suspension 30 mL  30 mL Oral Daily PRN Allen, Tina L, FNP       methocarbamol  (ROBAXIN ) tablet 750 mg  750 mg Oral TID PRN Allen, Tina L, FNP   750 mg at 04/10/24 0612   metoprolol  tartrate (LOPRESSOR ) tablet 50 mg  50 mg Oral BID Allen, Tina L, FNP   50 mg at 04/11/24 2117   multivitamin with minerals tablet 1 tablet  1 tablet Oral Daily Dasie Ellouise CROME, FNP   1 tablet at 04/11/24 1119   naltrexone  (DEPADE) tablet 50 mg  50 mg Oral Daily Freedom Peddy, MD   50 mg at 04/11/24 1119   nicotine   (NICODERM CQ  - dosed in mg/24 hours) patch 21 mg  21 mg Transdermal Daily Dasie Ellouise CROME, FNP   21 mg at 04/11/24 1128   OLANZapine  (ZYPREXA ) injection 5 mg  5 mg Intramuscular TID PRN Allen, Tina L, FNP       ondansetron  (ZOFRAN -ODT) disintegrating tablet 4 mg  4 mg Oral Q6H PRN Dasie Ellouise CROME, FNP       pantoprazole  (PROTONIX ) EC tablet 80 mg  80 mg Oral Daily Dasie Ellouise CROME, FNP   80 mg at 04/11/24 1119   risperiDONE  (RISPERDAL ) tablet 1 mg  1 mg Oral BID Dasie Ellouise CROME, FNP   1 mg at 04/11/24 2116   thiamine  (VITAMIN B1) tablet 100 mg  100 mg Oral Daily Allen, Tina L, FNP   100 mg at 04/11/24 1119   traZODone  (DESYREL ) tablet 50 mg  50 mg Oral QHS PRN Dasie Ellouise CROME, FNP       [START ON 04/13/2024] Vitamin D  (Ergocalciferol ) (DRISDOL ) 1.25 MG (50000 UNIT) capsule 50,000 Units  50,000 Units Oral Q Sat Dasie Ellouise CROME, FNP       Current Outpatient Medications  Medication Sig Dispense Refill   aspirin  81 MG chewable tablet Chew by mouth daily.     atorvastatin  (LIPITOR) 40 MG tablet Take 40 mg by mouth at bedtime.     calcium  carbonate (TUMS - DOSED IN MG ELEMENTAL CALCIUM ) 500 MG chewable tablet Chew 1 tablet by mouth 3 (three) times daily as needed for indigestion or heartburn.     clopidogrel  (PLAVIX ) 75 MG tablet Take 75 mg by mouth daily.     dexmethylphenidate (FOCALIN XR) 10 MG 24 hr capsule Take 10 mg by mouth daily.     dexmethylphenidate (FOCALIN) 5 MG tablet Take 5 mg by mouth 2 (two) times daily.     diphenhydrAMINE  (BENADRYL ) 25 mg capsule Take 25 mg by mouth daily as needed for itching.     DULoxetine  HCl 60 MG CSDR Take 120 mg by mouth daily.     Ferrous Sulfate (IRON PO) Take 1 tablet by mouth daily as needed (For iron supplement).     fluticasone  (FLONASE ) 50 MCG/ACT nasal spray Place 2 sprays into both nostrils in the morning and at bedtime.     folic acid  (FOLVITE ) 1 MG tablet Take 1 mg by mouth daily.     gabapentin  (NEURONTIN ) 600 MG tablet Take 600 mg by mouth 3 (three) times  daily.     hydrochlorothiazide (HYDRODIURIL) 25 MG tablet Take 25 mg by mouth daily.     hydrOXYzine  (VISTARIL ) 50  MG capsule Take 50 mg by mouth at bedtime.     LORazepam  (ATIVAN ) 1 MG tablet Take 1 mg by mouth every 8 (eight) hours.     losartan  (COZAAR ) 50 MG tablet Take 50 mg by mouth daily.     MAGNESIUM  PO Take 1 tablet by mouth daily as needed (For magnesium  supplement).     methocarbamol  (ROBAXIN ) 750 MG tablet Take 750 mg by mouth 3 (three) times daily as needed for muscle spasms.     metoprolol  tartrate (LOPRESSOR ) 50 MG tablet Take 50 mg by mouth 2 (two) times daily.     naltrexone  (DEPADE) 50 MG tablet Take 50 mg by mouth daily.     nicotine  (NICODERM CQ  - DOSED IN MG/24 HOURS) 21 mg/24hr patch Place 21 mg onto the skin daily.     omeprazole  (PRILOSEC) 40 MG capsule Take 40 mg by mouth daily.     ondansetron  (ZOFRAN ) 4 MG tablet Take 4-8 mg by mouth every 8 (eight) hours as needed for nausea.     risperiDONE  (RISPERDAL ) 1 MG tablet Take 1 mg by mouth 2 (two) times daily.     thiamine  (VITAMIN B1) 100 MG tablet Take 100 mg by mouth daily.     Vitamin D , Ergocalciferol , (DRISDOL ) 1.25 MG (50000 UNIT) CAPS capsule Take 50,000 Units by mouth every Saturday.      Labs  Lab Results:  Admission on 04/08/2024, Discharged on 04/09/2024  Component Date Value Ref Range Status   WBC 04/08/2024 5.4  4.0 - 10.5 K/uL Final   RBC 04/08/2024 4.08  3.87 - 5.11 MIL/uL Final   Hemoglobin 04/08/2024 13.1  12.0 - 15.0 g/dL Final   HCT 87/77/7974 37.1  36.0 - 46.0 % Final   MCV 04/08/2024 90.9  80.0 - 100.0 fL Final   MCH 04/08/2024 32.1  26.0 - 34.0 pg Final   MCHC 04/08/2024 35.3  30.0 - 36.0 g/dL Final   RDW 87/77/7974 12.7  11.5 - 15.5 % Final   Platelets 04/08/2024 271  150 - 400 K/uL Final   nRBC 04/08/2024 0.0  0.0 - 0.2 % Final   Neutrophils Relative % 04/08/2024 51  % Final   Neutro Abs 04/08/2024 2.8  1.7 - 7.7 K/uL Final   Lymphocytes Relative 04/08/2024 33  % Final   Lymphs Abs  04/08/2024 1.8  0.7 - 4.0 K/uL Final   Monocytes Relative 04/08/2024 13  % Final   Monocytes Absolute 04/08/2024 0.7  0.1 - 1.0 K/uL Final   Eosinophils Relative 04/08/2024 2  % Final   Eosinophils Absolute 04/08/2024 0.1  0.0 - 0.5 K/uL Final   Basophils Relative 04/08/2024 1  % Final   Basophils Absolute 04/08/2024 0.0  0.0 - 0.1 K/uL Final   Immature Granulocytes 04/08/2024 0  % Final   Abs Immature Granulocytes 04/08/2024 0.02  0.00 - 0.07 K/uL Final   Performed at [facility], [address] [place], [place] [address]   Sodium 04/08/2024 132 (L)  135 - 145 mmol/L Final   Potassium 04/08/2024 3.9  3.5 - 5.1 mmol/L Final   Chloride 04/08/2024 95 (L)  98 - 111 mmol/L Final   CO2 04/08/2024 20 (L)  22 - 32 mmol/L Final   Glucose, Bld 04/08/2024 71  70 - 99 mg/dL Final   Glucose reference range applies only to samples taken after fasting for at least 8 hours.   BUN 04/08/2024 9  6 - 20 mg/dL Final   Creatinine, Ser 04/08/2024 0.64  0.44 - 1.00 mg/dL Final   Calcium  04/08/2024 9.8  8.9 - 10.3 mg/dL Final   Total Protein 87/77/7974 7.4  6.5 - 8.1 g/dL Final   Albumin 87/77/7974 4.8  3.5 - 5.0 g/dL Final   AST 87/77/7974 36  15 - 41 U/L Final   ALT 04/08/2024 26  0 - 44 U/L Final   Alkaline Phosphatase 04/08/2024 83  38 - 126 U/L Final   Total Bilirubin 04/08/2024 0.4  0.0 - 1.2 mg/dL Final   GFR, Estimated 04/08/2024 >60  >60 mL/min Final   Comment: (NOTE) Calculated using the CKD-EPI Creatinine Equation (2021)    Anion gap 04/08/2024 17 (H)  5 - 15 Final   Performed at [facility], [address], [place], [place] [address]   Magnesium  04/08/2024 1.8  1.7 - 2.4 mg/dL Final   Performed at [facility], [address], [place], [place] [address]   Hgb A1c MFr Bld 04/08/2024 5.2  4.8 - 5.6 % Final   Comment: (NOTE) Diagnosis of Diabetes The following HbA1c ranges recommended by the American Diabetes Association (ADA) may be used as an aid in the diagnosis of  diabetes mellitus.  Hemoglobin             Suggested A1C NGSP%              Diagnosis  <5.7                   Non Diabetic  5.7-6.4                Pre-Diabetic  >6.4                   Diabetic  <7.0                   Glycemic control for                       adults with diabetes.     Mean Plasma Glucose 04/08/2024 102.54  mg/dL Final   Performed at [facility], [address] [place], [place] [address]   Alcohol, Ethyl (B) 04/08/2024 140 (H)  <15 mg/dL Final   Comment: (NOTE) For medical purposes only. Performed at [facility], [address], [place], [place] [address]    Cholesterol 04/08/2024 190  0 - 200 mg/dL Final   Comment:        ATP III CLASSIFICATION:  <200     mg/dL   Desirable  799-760  mg/dL   Borderline High  >=759    mg/dL   High           Triglycerides 04/08/2024 85  <150 mg/dL Final   HDL 87/77/7974 83  >40 mg/dL Final   Total CHOL/HDL Ratio 04/08/2024 2.3  RATIO Final   VLDL 04/08/2024 17  0 - 40 mg/dL Final   LDL Cholesterol 04/08/2024 90  0 - 99 mg/dL Final   Comment:        Total Cholesterol/HDL:CHD Risk Coronary Heart Disease Risk Table                     Men   Women  1/2 Average Risk   3.4   3.3  Average Risk       5.0   4.4  2 X Average Risk   9.6   7.1  3 X Average Risk  23.4  11.0        Use the calculated Patient Ratio above and the CHD Risk Table to determine the patient's CHD Risk.        ATP III CLASSIFICATION (LDL):  <100     mg/dL   Optimal  899-870  mg/dL   Near or Above                    Optimal  130-159  mg/dL   Borderline  839-810  mg/dL   High  >809     mg/dL   Very High Performed at [facility], [address], [place], [place] [address]    TSH 04/08/2024 1.160  0.350 - 4.500 uIU/mL Final   Performed at [facility], [address] [place], [place] [address]   Color, Urine 04/08/2024 STRAW (A)  YELLOW Final   APPearance 04/08/2024 CLEAR  CLEAR Final   Specific Gravity, Urine 04/08/2024  1.005  1.005 - 1.030 Final   pH 04/08/2024 6.0  5.0 - 8.0 Final   Glucose, UA 04/08/2024 NEGATIVE  NEGATIVE mg/dL Final   Hgb urine dipstick 04/08/2024 SMALL (A)  NEGATIVE Final   Bilirubin Urine 04/08/2024 NEGATIVE  NEGATIVE Final   Ketones, ur 04/08/2024 NEGATIVE  NEGATIVE mg/dL Final   Protein, ur 87/77/7974 NEGATIVE  NEGATIVE mg/dL Final   Nitrite 87/77/7974 NEGATIVE  NEGATIVE Final   Leukocytes,Ua 04/08/2024 SMALL (A)  NEGATIVE Final   RBC / HPF 04/08/2024 0-5  0 - 5 RBC/hpf Final   WBC, UA 04/08/2024 0-5  0 - 5 WBC/hpf Final   Bacteria, UA 04/08/2024 RARE (A)  NONE SEEN Final   Squamous Epithelial / HPF 04/08/2024 0-5  0 - 5 /HPF Final   Performed at [facility] Lab, [address], [place], [place] [address]   POC Amphetamine UR 04/08/2024 None Detected  NONE DETECTED (Cut Off Level 1000 ng/mL) Final   POC Secobarbital (BAR) 04/08/2024 None Detected  NONE DETECTED (Cut Off Level 300 ng/mL) Final   POC Buprenorphine (BUP) 04/08/2024 None Detected  NONE DETECTED (Cut Off Level 10 ng/mL) Final   POC Oxazepam (BZO) 04/08/2024 None Detected  NONE DETECTED (Cut Off Level 300 ng/mL) Final   POC Cocaine UR 04/08/2024 None Detected  NONE DETECTED (Cut Off Level 300 ng/mL) Final   POC Methamphetamine UR 04/08/2024 None Detected  NONE DETECTED (Cut Off Level 1000 ng/mL) Final   POC Morphine  04/08/2024 None Detected  NONE DETECTED (Cut Off Level 300 ng/mL) Final   POC Methadone UR 04/08/2024 None Detected  NONE DETECTED (Cut Off Level 300 ng/mL) Final   POC Oxycodone  UR 04/08/2024 None Detected  NONE DETECTED (Cut Off Level 100 ng/mL) Final   POC Marijuana UR 04/08/2024 Positive (A)  NONE DETECTED (Cut Off Level 50 ng/mL) Final    Blood Alcohol level:  Lab Results  Component Value Date   ETH 140 (H) 04/08/2024   ETH <15 08/17/2023    Metabolic Disorder Labs: Lab Results  Component Value Date   HGBA1C 5.2 04/08/2024   MPG 102.54 04/08/2024   No results found for: PROLACTIN Lab  Results  Component Value Date   CHOL 190 04/08/2024   TRIG 85 04/08/2024   HDL 83 04/08/2024   CHOLHDL 2.3 04/08/2024   VLDL 17 04/08/2024   LDLCALC 90 04/08/2024    Therapeutic Lab Levels: No results found for: LITHIUM No results found for: VALPROATE Lab Results  Component Value Date   CBMZ 7.5 03/28/2014  Physical Findings   AUDIT    Flowsheet Row ED from 04/09/2024 in [facility]  Alcohol Use Disorder Identification Test Final Score (AUDIT) 31   PHQ2-9    Flowsheet Row ED from 04/09/2024 in [facility] ED from 04/08/2024 in [facility]  PHQ-2 Total Score 6 2  PHQ-9 Total Score 24 8   Flowsheet Row ED from 04/09/2024 in [facility] ED from 04/08/2024 in [facility] ED to Hosp-Admission (Discharged) from 08/17/2023 in [facility] NORTH  SURGICAL  C-SSRS RISK CATEGORY No Risk No Risk No Risk     Musculoskeletal  Strength & Muscle Tone: within normal limits Gait & Station: normal Patient leans: N/A  Psychiatric Specialty Exam  Presentation  General Appearance:  Appropriate for Environment; Casual  Eye Contact: Good  Speech: Clear and Coherent; Normal Rate  Speech Volume: Normal  Handedness: Right   Mood and Affect  Mood: Depressed  Affect: Depressed   Thought Process  Thought Processes: Coherent; Goal Directed; Linear  Descriptions of Associations:Intact  Orientation:Full (Time, Place and Person)  Thought Content:Logical; WDL  Diagnosis of Schizophrenia or Schizoaffective disorder in past: No    Hallucinations:No data recorded  Ideas of Reference:None  Suicidal Thoughts:No data recorded  Homicidal Thoughts:No data recorded   Sensorium  Memory: Immediate Fair  Judgment: Fair  Insight: Fair   Art Therapist  Concentration: Fair  Attention  Span: Poor  Recall: Fair  Fund of Knowledge: Good  Language: Good   Psychomotor Activity  Psychomotor Activity: No data recorded   Assets  Assets: Communication Skills; Desire for Improvement; Financial Resources/Insurance; Social Support   Sleep  Sleep: No data recorded  Estimated Sleeping Duration (Last 24 Hours): 11.50-12.00 hours  No data recorded  Physical Exam  Physical Exam ROS Blood pressure 117/87, pulse 70, temperature 97.7 F (36.5 C), temperature source Oral, resp. rate 20, SpO2 100%. There is no height or weight on file to calculate BMI.  Treatment Plan Summary: Daily contact with patient to assess and evaluate symptoms and progress in treatment and Medication management  Patient is a 58 year old female with the above-stated past psychiatric history who is seen in follow-up.  Chart reviewed. Patient discussed with nursing. Patient is being monitored for the symptoms of acute alcohol withdrawal and mood stability. She is not in acute withdrawal anymore and her mood is stable. No changes in medications are planned for today. Hopefully, she will be transferred to [facility] treatment center tomorrow.   PLAN:  Safety and Monitoring: continue FBC admission; 15-minute checks; daily contact with patient to assess and evaluate symptoms and progress in treatment; psychoeducation.Vital signs: q12 hours.   Medications:  Addiction: -CIWA discontinued -Librium  protocol will be completed today -Gabapentin  600 mg 3 times daily -Thiamine  tablet 100 mg daily for thiamine  deficiency in settings of alcohol use d/o - continue Naltrexone  50mg  PO daily for alcohol cravings -Nicotine  patch 21 mg transdermal daily for tobacco cravings   Bipolar disorder, depression, anxiety, insomnia: -continue Cymbalta  30mg  po daily for depression, anxiety, pain -continue Risperidone  1mg  PO BID for bipolar disorder -continue Hydroxyzine  25mg  PO TID PRN anxiety. -continue Trazodone   50 mg nightly, as needed/sleep  Other medical conditions: -Aspirin  chewable 81 mg daily for HLD -Atorvastatin  40 mg nightly for HLD -Losartan  50 mg daily for HTN -Metoprolol  tartrate 50 mg twice daily for HTN -Multivitamin with minerals 1 tablet daily; Folic acid  1 mg daily for general  health maintenance -Pantoprazole  EC 80 mg daily for GERD -Vitamin D  (ergocalciferol ) 1.25 mg 50,000 units oral every Saturday for vit D deficiency  PRN meds: acetaminophen , alum & mag hydroxide-simeth, calcium  carbonate, chlordiazePOXIDE , hydrOXYzine , ibuprofen , loperamide , magnesium  hydroxide, methocarbamol , OLANZapine , ondansetron , traZODone       Discharge Planning: Disposition/case management to assist with discharge planning and identification of follow-up needs including residential or outpatient substance use treatment options as needed, prior to discharge. Likely will be discharged tomorrow 12/27 to [facility].  Total Time Spent in Direct Patient Care:  I personally spent 35 minutes on the unit in direct patient care. The direct patient care time included face-to-face time with the patient, reviewing the patient's chart, communicating with other professionals, and coordinating care. Greater than 50% of this time was spent in counseling or coordinating care with the patient regarding goals of hospitalization, psycho-education, and discharge planning needs.    Neil Appl, MD 04/12/2024 8:11 AM

## 2024-04-12 NOTE — ED Notes (Signed)
 Pt presents with a flat affect, continues to ask for medications , medication education provided.  Pt changes numbers for pain, needs anticipated.  Safety maintained.

## 2024-04-12 NOTE — Care Management (Addendum)
 FBC Care Management...  Addendum 11:01  Patient asked writer to contact sister and provide note granting access to previous residence to allow sister Kathye) to obtain her pocket book.  Writer spoke with Sonny and advised that note will be at front desk      Patient requested inpatient treatment  Patient can discharge Saturday 04/13/2024  Writer spoke with Noemi at [employer] coordinated transportation  [employer] will transport patient...  Writer requested pick up by 10:30 am. Driver will call with ETA   [facility] [address],  [place], [place] [address] Phone: [phone]  30 day scripts

## 2024-04-12 NOTE — ED Notes (Signed)
 Patient is in the bedroom cam and Sleeping.  NAD

## 2024-04-12 NOTE — ED Notes (Signed)
 Pt. Continues to request medications when meds where recently administered.   Medication education review,  Pt. Verbalizes with understanding. Pt paces in hallway, using one phone number and then another.  Denies symptoms for withdrawals. Medication compliant.  Safety maintained.

## 2024-04-12 NOTE — ED Notes (Signed)
 Pt received at the beginning of shift interacting appropriately with peers. Affect remains flat . Patient pleasant ,  Safety maintained.

## 2024-04-12 NOTE — ED Notes (Signed)
 Pt sitting in dayroom interacting with peers and watching TV. No acute distress noted. Continue to monitor for safety.

## 2024-04-12 NOTE — Group Note (Signed)
 Group Topic: Relapse and Recovery  Group Date: 04/12/2024 Start Time: 8069 End Time: 2000 Facilitators: Verdon Jacqualyn BRAVO, NT  Department: [facility]  Number of Participants: 8  Group Focus: coping skills Treatment Modality:  Individual Therapy Interventions utilized were group exercise Purpose: relapse prevention strategies  Name: Tiffany [name] Date of Birth: 1966-04-04  MR: [id number]    Level of Participation: active Quality of Participation: cooperative Interactions with others: gave feedback Mood/Affect: appropriate Triggers (if applicable): n/a Cognition: coherent/clear Progress: Moderate Response: n/a Plan: follow-up needed  Patients Problems:  Patient Active Problem List   Diagnosis Date Noted   Alcohol use disorder 04/09/2024   Essential hypertension 08/18/2023   Chronic hyponatremia 11/06/2022   Polysubstance abuse (HCC) 11/06/2022   Colitis 11/06/2022   Hypomagnesemia 11/06/2022   Bizarre behavior 04/01/2014   Hypersexuality    Bipolar disorder, current episode mixed, moderate (HCC)    Bipolar disorder (HCC) 12/04/2013   Delusional disorder (HCC) 12/01/2013   Bipolar disorder, unspecified (HCC) 11/30/2013   Substance induced mood disorder (HCC) 11/30/2013   Hepatitis C 01/08/2013   Alcohol abuse 03/28/2011   Psychosis (HCC) 03/24/2011   Hyponatremia 03/23/2011   Hypokalemia 03/23/2011   Depression 03/23/2011   Anxiety 03/23/2011   Tobacco abuse 03/23/2011   CERVICALGIA 08/21/2007   CELLULITIS 05/18/2007   EPIDERMOID CYST 05/18/2007   DYSPEPSIA 03/05/2007

## 2024-04-12 NOTE — Group Note (Signed)
 Group Topic: Fears and Unhealthy Coping Skills  Group Date: 04/12/2024 Start Time: 1400 End Time: 1435 Facilitators: Deidre Prentis CROME, NT  Department: [facility]  Number of Participants: 4  Group Focus: coping skills Treatment Modality:  Psychoeducation Interventions utilized were story telling Purpose: increase insight  Name: Tiffany [name] Date of Birth: [birth year]  MR: [id number]    Level of Participation: active Quality of Participation: attentive Interactions with others: gave feedback Mood/Affect: appropriate Triggers (if applicable): N/A Cognition: coherent/clear Progress: Moderate Response: Pt attentive and receptive to psycho-ed; demonstrated insight.  Plan: follow-up needed  Patients Problems:  Patient Active Problem List   Diagnosis Date Noted   Alcohol use disorder 04/09/2024   Essential hypertension 08/18/2023   Chronic hyponatremia 11/06/2022   Polysubstance abuse (HCC) 11/06/2022   Colitis 11/06/2022   Hypomagnesemia 11/06/2022   Bizarre behavior 04/01/2014   Hypersexuality    Bipolar disorder, current episode mixed, moderate (HCC)    Bipolar disorder (HCC) 12/04/2013   Delusional disorder (HCC) 12/01/2013   Bipolar disorder, unspecified (HCC) 11/30/2013   Substance induced mood disorder (HCC) 11/30/2013   Hepatitis C 01/08/2013   Alcohol abuse 03/28/2011   Psychosis (HCC) 03/24/2011   Hyponatremia 03/23/2011   Hypokalemia 03/23/2011   Depression 03/23/2011   Anxiety 03/23/2011   Tobacco abuse 03/23/2011   CERVICALGIA 08/21/2007   CELLULITIS 05/18/2007   EPIDERMOID CYST 05/18/2007   DYSPEPSIA 03/05/2007

## 2024-04-13 DIAGNOSIS — F1090 Alcohol use, unspecified, uncomplicated: Secondary | ICD-10-CM | POA: Diagnosis not present

## 2024-04-13 DIAGNOSIS — K0889 Other specified disorders of teeth and supporting structures: Secondary | ICD-10-CM | POA: Diagnosis not present

## 2024-04-13 MED ORDER — ASPIRIN 81 MG PO CHEW: 81.0000 mg | CHEWABLE_TABLET | Freq: Every day | ORAL | 0 refills | Status: AC

## 2024-04-13 MED ORDER — METOPROLOL TARTRATE 50 MG PO TABS: 50.0000 mg | ORAL_TABLET | Freq: Two times a day (BID) | ORAL | 0 refills | Status: AC

## 2024-04-13 MED ORDER — GABAPENTIN 300 MG PO CAPS: 600.0000 mg | ORAL_CAPSULE | Freq: Three times a day (TID) | ORAL | 0 refills | Status: AC

## 2024-04-13 MED ORDER — HYDROXYZINE HCL 25 MG PO TABS: 25.0000 mg | ORAL_TABLET | Freq: Four times a day (QID) | ORAL | 0 refills | Status: AC | PRN

## 2024-04-13 MED ORDER — VITAMIN D (ERGOCALCIFEROL) 1.25 MG (50000 UNIT) PO CAPS: 50000.0000 [IU] | ORAL_CAPSULE | ORAL | 0 refills | Status: AC

## 2024-04-13 MED ORDER — NALTREXONE HCL 50 MG PO TABS: 50.0000 mg | ORAL_TABLET | Freq: Every day | ORAL | 0 refills | Status: AC

## 2024-04-13 MED ORDER — NICOTINE 21 MG/24HR TD PT24: 21.0000 mg | MEDICATED_PATCH | Freq: Every day | TRANSDERMAL | 0 refills | Status: AC

## 2024-04-13 MED ORDER — RISPERIDONE 1 MG PO TABS: 1.0000 mg | ORAL_TABLET | Freq: Two times a day (BID) | ORAL | 0 refills | Status: AC

## 2024-04-13 MED ORDER — TRAZODONE HCL 50 MG PO TABS: 50.0000 mg | ORAL_TABLET | Freq: Every evening | ORAL | 0 refills | Status: AC | PRN

## 2024-04-13 MED ORDER — PANTOPRAZOLE SODIUM 40 MG PO TBEC: 80.0000 mg | DELAYED_RELEASE_TABLET | Freq: Every day | ORAL | 0 refills | Status: AC

## 2024-04-13 MED ORDER — DULOXETINE HCL 30 MG PO CPEP: 30.0000 mg | ORAL_CAPSULE | Freq: Every day | ORAL | 0 refills | Status: AC

## 2024-04-13 MED ORDER — ATORVASTATIN CALCIUM 40 MG PO TABS: 40.0000 mg | ORAL_TABLET | Freq: Every day | ORAL | 0 refills | Status: AC

## 2024-04-13 NOTE — ED Notes (Signed)
 Pt sleeping in bed, no acute distress noted. Respirations even and unlabored. Continue to monitor for safety.

## 2024-04-13 NOTE — Care Management (Addendum)
 FBC Care Management..   Addendum 9:09 am  Writer received call from [facility] transportation  Driver is en route and will call as they get closer  Writer received return call from [place], discussed current medications and clinical research associate informed Sonny that we will be sending scripts with patient to [facility]  Sonny reported she will be mailing some money to [facility] for her sister   Patient scheduled for discharge today Saturday 04/13/24  [facility] driver will call with ETA  Writer met with patient to discuss discharge planning  Writer confirmed with patient discharge plan to [facility] in place for today.  Patient reported nervous and excited about about the process  Patient asked writer to contact patients sister, Sonny to confirm that she will be able to bring the money to get for food and snacks while in route to [facility]  Writer left HIPAA compliant message for Sonny to return call

## 2024-04-13 NOTE — ED Notes (Signed)
 Pt denies SI/HI/AVH. No reports of pain. Compliant with scheduled meds; prn robaxin  and trazodone  given. She was calm, cooperative, and pleasant. No behavioral concerns at this time. Pt sleeping in bed, no acute distress noted. Respirations even and unlabored. Continue to monitor for safety.

## 2024-04-13 NOTE — ED Provider Notes (Signed)
 FBC/OBS ASAP Discharge Summary  Date and Time: 04/13/2024 10:52 AM  Name: Tiffany [name]  MRN:  [id number]   Discharge Diagnoses:  Final diagnoses:  Alcohol use disorder [F10.90]    HPI: Patient is a  58y.o. female who initially presented [facility] the [place] [facility] on 04/08/2024 seeking substance abuse treatment; Pt reported abusing alcohol, & asked for assistance getting into substance abuse rehabilitation after being detoxed from alcohol.  Stay Summary:  Pt was admitted to the [facility] center at the [facility] & started in an Ativan  taper for  alcohol use d/o, followed by a referral to the [facility]. Patient was also started on her home medications for management of both mental health problems as well as her medical conditions. Patient tolerated her Ativan  taper with no issues/concerns.  Patient's care was discussed during the interdisciplinary team meeting every day during the their stay at the [facility].  The patient denies having side effects to prescribed psychiatric medications. The patient was evaluated each day by a clinical provider to ascertain response to treatment. Improvement was noted by the patient's report of decreasing symptoms, improved sleep and appetite, affect, medication tolerance, behavior, and participation in unit programming. Symptoms were reported as significantly decreased or resolved completely by discharge.   On day of discharge, the patient reports that their mood is stable. The patient denied having suicidal thoughts for more than 48 hours prior to discharge.  Patient denies having homicidal thoughts.  Denies auditory disturbances. Patient denies any visual hallucinations. Denies paranoia and there were no overt signs of psychosis at time of discharge. The patient is motivated to continue taking medication with a goal of continued improvement in mental health.   Patient is being discharged, & a door to door transfer  to the [facility] is being enabled so as to help mitigate the risk for relapse prior to rehabilitation. Scripts for 30 days supply of medications provided to patient.  Labs were reviewed with the patient, and abnormal results were discussed with the patient. Na level on 12/22 was 135, and pt seems to have a chronically low Na level for the past 8 months (121 on 08/17/23, 5/2 was 125 and was 128 on 5/2 after a repeat). Pt has been educated on the need to follow up with PCP regarding this lab.   Total Time spent with patient: 45 minutes  Past Psychiatric History: history of hypertension, hyperlipidemia, chronic hyponatremia, GERD, chronic hepatitis C and a psychiatric history significant for schizoaffective disorder, bipolar disorder, GAD on prescription benzo ADHD, alcohol use disorder, THC use.  Past Medical History: As above. Family History: not provided Family Psychiatric History: not provided Social History: Lives alone, not currently employed Tobacco Cessation:  A prescription for an FDA-approved tobacco cessation medication provided at discharge  Current Medications:  Current Facility-Administered Medications  Medication Dose Route Frequency Provider Last Rate Last Admin   acetaminophen  (TYLENOL ) tablet 650 mg  650 mg Oral Q6H PRN Dasie Ellouise CROME, FNP   650 mg at 04/12/24 1524   alum & mag hydroxide-simeth (MAALOX/MYLANTA) 200-200-20 MG/5ML suspension 30 mL  30 mL Oral Q4H PRN Dasie Ellouise CROME, FNP       aspirin  chewable tablet 81 mg  81 mg Oral Daily Dasie Ellouise CROME, FNP   81 mg at 04/13/24 1007   atorvastatin  (LIPITOR) tablet 40 mg  40 mg Oral QHS Dasie Ellouise CROME, FNP   40 mg at 04/09/24 2142   calcium  carbonate (TUMS -  dosed in mg elemental calcium ) chewable tablet 200 mg of elemental calcium   1 tablet Oral TID PRN Allen, Tina L, FNP       DULoxetine  (CYMBALTA ) DR capsule 30 mg  30 mg Oral Daily Paliy, Alisa, MD   30 mg at 04/13/24 1006   folic acid  (FOLVITE ) tablet 1 mg  1 mg  Oral Daily Dasie Ellouise CROME, FNP   1 mg at 04/13/24 1005   gabapentin  (NEURONTIN ) capsule 600 mg  600 mg Oral TID Dasie Ellouise CROME, FNP   600 mg at 04/13/24 1006   hydrOXYzine  (ATARAX ) tablet 25 mg  25 mg Oral Q6H PRN Paliy, Alisa, MD   25 mg at 04/13/24 0825   ibuprofen  (ADVIL ) tablet 400 mg  400 mg Oral Q6H PRN Paliy, Alisa, MD       losartan  (COZAAR ) tablet 50 mg  50 mg Oral Daily Dasie Ellouise CROME, FNP   50 mg at 04/13/24 1005   magnesium  hydroxide (MILK OF MAGNESIA) suspension 30 mL  30 mL Oral Daily PRN Allen, Tina L, FNP       methocarbamol  (ROBAXIN ) tablet 750 mg  750 mg Oral TID PRN Allen, Tina L, FNP   750 mg at 04/13/24 0825   metoprolol  tartrate (LOPRESSOR ) tablet 50 mg  50 mg Oral BID Allen, Tina L, FNP   50 mg at 04/13/24 1006   multivitamin with minerals tablet 1 tablet  1 tablet Oral Daily Dasie Ellouise CROME, FNP   1 tablet at 04/13/24 1005   naltrexone  (DEPADE) tablet 50 mg  50 mg Oral Daily Paliy, Alisa, MD   50 mg at 04/13/24 1006   nicotine  (NICODERM CQ  - dosed in mg/24 hours) patch 21 mg  21 mg Transdermal Daily Dasie Ellouise CROME, FNP   21 mg at 04/13/24 1005   OLANZapine  (ZYPREXA ) injection 5 mg  5 mg Intramuscular TID PRN Dasie Ellouise CROME, FNP       pantoprazole  (PROTONIX ) EC tablet 80 mg  80 mg Oral Daily Dasie Ellouise CROME, FNP   80 mg at 04/13/24 1007   risperiDONE  (RISPERDAL ) tablet 1 mg  1 mg Oral BID Dasie Ellouise CROME, FNP   1 mg at 04/13/24 1006   thiamine  (VITAMIN B1) tablet 100 mg  100 mg Oral Daily Dasie Ellouise CROME, FNP   100 mg at 04/13/24 1006   traZODone  (DESYREL ) tablet 50 mg  50 mg Oral QHS PRN Allen, Tina L, FNP   50 mg at 04/12/24 2118   Vitamin D  (Ergocalciferol ) (DRISDOL ) 1.25 MG (50000 UNIT) capsule 50,000 Units  50,000 Units Oral Q Sat Dasie Ellouise CROME, FNP   50,000 Units at 04/13/24 1006   Current Outpatient Medications  Medication Sig Dispense Refill   aspirin  81 MG chewable tablet Chew 1 tablet (81 mg total) by mouth daily. 30 tablet 0   atorvastatin  (LIPITOR) 40 MG tablet Take 1  tablet (40 mg total) by mouth at bedtime. 30 tablet 0   DULoxetine  (CYMBALTA ) 30 MG capsule Take 1 capsule (30 mg total) by mouth daily. 30 capsule 0   gabapentin  (NEURONTIN ) 300 MG capsule Take 2 capsules (600 mg total) by mouth 3 (three) times daily. 180 capsule 0   hydrOXYzine  (ATARAX ) 25 MG tablet Take 1 tablet (25 mg total) by mouth every 6 (six) hours as needed for anxiety. 30 tablet 0   metoprolol  tartrate (LOPRESSOR ) 50 MG tablet Take 1 tablet (50 mg total) by mouth 2 (two) times daily. 30 tablet 0   naltrexone  (DEPADE)  50 MG tablet Take 1 tablet (50 mg total) by mouth daily. 30 tablet 0   nicotine  (NICODERM CQ  - DOSED IN MG/24 HOURS) 21 mg/24hr patch Place 1 patch (21 mg total) onto the skin daily. 30 patch 0   pantoprazole  (PROTONIX ) 40 MG tablet Take 2 tablets (80 mg total) by mouth daily. 60 tablet 0   risperiDONE  (RISPERDAL ) 1 MG tablet Take 1 tablet (1 mg total) by mouth 2 (two) times daily. 60 tablet 0   traZODone  (DESYREL ) 50 MG tablet Take 1 tablet (50 mg total) by mouth at bedtime as needed for sleep. 30 tablet 0   Vitamin D , Ergocalciferol , (DRISDOL ) 1.25 MG (50000 UNIT) CAPS capsule Take 1 capsule (50,000 Units total) by mouth every Saturday. 5 capsule 0    PTA Medications:  PTA Medications  Medication Sig   aspirin  81 MG chewable tablet Chew 1 tablet (81 mg total) by mouth daily.   atorvastatin  (LIPITOR) 40 MG tablet Take 1 tablet (40 mg total) by mouth at bedtime.   DULoxetine  (CYMBALTA ) 30 MG capsule Take 1 capsule (30 mg total) by mouth daily.   gabapentin  (NEURONTIN ) 300 MG capsule Take 2 capsules (600 mg total) by mouth 3 (three) times daily.   hydrOXYzine  (ATARAX ) 25 MG tablet Take 1 tablet (25 mg total) by mouth every 6 (six) hours as needed for anxiety.   metoprolol  tartrate (LOPRESSOR ) 50 MG tablet Take 1 tablet (50 mg total) by mouth 2 (two) times daily.   pantoprazole  (PROTONIX ) 40 MG tablet Take 2 tablets (80 mg total) by mouth daily.   Vitamin D ,  Ergocalciferol , (DRISDOL ) 1.25 MG (50000 UNIT) CAPS capsule Take 1 capsule (50,000 Units total) by mouth every Saturday.   traZODone  (DESYREL ) 50 MG tablet Take 1 tablet (50 mg total) by mouth at bedtime as needed for sleep.   risperiDONE  (RISPERDAL ) 1 MG tablet Take 1 tablet (1 mg total) by mouth 2 (two) times daily.   nicotine  (NICODERM CQ  - DOSED IN MG/24 HOURS) 21 mg/24hr patch Place 1 patch (21 mg total) onto the skin daily.   naltrexone  (DEPADE) 50 MG tablet Take 1 tablet (50 mg total) by mouth daily.   Facility Ordered Medications  Medication   [COMPLETED] thiamine  (VITAMIN B1) injection 100 mg   acetaminophen  (TYLENOL ) tablet 650 mg   alum & mag hydroxide-simeth (MAALOX/MYLANTA) 200-200-20 MG/5ML suspension 30 mL   aspirin  chewable tablet 81 mg   atorvastatin  (LIPITOR) tablet 40 mg   calcium  carbonate (TUMS - dosed in mg elemental calcium ) chewable tablet 200 mg of elemental calcium    [EXPIRED] chlordiazePOXIDE  (LIBRIUM ) capsule 25 mg   [COMPLETED] chlordiazePOXIDE  (LIBRIUM ) capsule 25 mg   Followed by   [COMPLETED] chlordiazePOXIDE  (LIBRIUM ) capsule 25 mg   Followed by   [COMPLETED] chlordiazePOXIDE  (LIBRIUM ) capsule 25 mg   Followed by   [COMPLETED] chlordiazePOXIDE  (LIBRIUM ) capsule 25 mg   folic acid  (FOLVITE ) tablet 1 mg   gabapentin  (NEURONTIN ) capsule 600 mg   [EXPIRED] loperamide  (IMODIUM ) capsule 2-4 mg   losartan  (COZAAR ) tablet 50 mg   magnesium  hydroxide (MILK OF MAGNESIA) suspension 30 mL   methocarbamol  (ROBAXIN ) tablet 750 mg   metoprolol  tartrate (LOPRESSOR ) tablet 50 mg   multivitamin with minerals tablet 1 tablet   nicotine  (NICODERM CQ  - dosed in mg/24 hours) patch 21 mg   [EXPIRED] ondansetron  (ZOFRAN -ODT) disintegrating tablet 4 mg   pantoprazole  (PROTONIX ) EC tablet 80 mg   risperiDONE  (RISPERDAL ) tablet 1 mg   thiamine  (VITAMIN B1) tablet 100 mg  traZODone  (DESYREL ) tablet 50 mg   Vitamin D  (Ergocalciferol ) (DRISDOL ) 1.25 MG (50000 UNIT) capsule  50,000 Units   OLANZapine  (ZYPREXA ) injection 5 mg   hydrOXYzine  (ATARAX ) tablet 25 mg   ibuprofen  (ADVIL ) tablet 400 mg   naltrexone  (DEPADE) tablet 50 mg   DULoxetine  (CYMBALTA ) DR capsule 30 mg       04/13/2024    9:09 AM 04/09/2024    5:57 PM 04/08/2024    6:47 PM  Depression screen PHQ 2/9  Decreased Interest 1 3 1   Down, Depressed, Hopeless 1 3 1   PHQ - 2 Score 2 6 2   Altered sleeping 0 3 1  Tired, decreased energy 0 3 1  Change in appetite 0 3 1  Feeling bad or failure about yourself  0 3 1  Trouble concentrating 0 3 1  Moving slowly or fidgety/restless 0 3 1  Suicidal thoughts 0 0 0  PHQ-9 Score 2 24 8   Difficult doing work/chores Not difficult at all Extremely dIfficult Very difficult    Flowsheet Row ED from 04/09/2024 in [facility] ED from 04/08/2024 in [facility] ED to Hosp-Admission (Discharged) from 08/17/2023 in [facility]  SURGICAL  C-SSRS RISK CATEGORY No Risk No Risk No Risk    Musculoskeletal  Strength & Muscle Tone: within normal limits Gait & Station: normal Patient leans: N/A  Psychiatric Specialty Exam  Presentation  General Appearance:  Fairly Groomed; Casual  Eye Contact: Good  Speech: Clear and Coherent  Speech Volume: Normal  Handedness: Right   Mood and Affect  Mood: Euthymic  Affect: Congruent   Thought Process  Thought Processes: Coherent  Descriptions of Associations:Intact  Orientation:Full (Time, Place and Person)  Thought Content:Logical  Diagnosis of Schizophrenia or Schizoaffective disorder in past: No    Hallucinations:Hallucinations: None  Ideas of Reference:None  Suicidal Thoughts:Suicidal Thoughts: No  Homicidal Thoughts:Homicidal Thoughts: No   Sensorium  Memory: Immediate Good  Judgment: Good  Insight: Good   Executive Functions  Concentration: Good  Attention Span: Good  Recall: Good  Fund  of Knowledge: Good  Language: Good   Psychomotor Activity  Psychomotor Activity:Psychomotor Activity: Normal   Assets  Assets: Resilience   Sleep  Sleep:Sleep: Good  Estimated Sleeping Duration (Last 24 Hours): 7.25-8.75 hours  Nutritional Assessment (For OBS and FBC admissions only) Has the patient had a weight loss or gain of 10 pounds or more in the last 3 months?: No Has the patient had a decrease in food intake/or appetite?: No Does the patient have dental problems?: No Does the patient have eating habits or behaviors that may be indicators of an eating disorder including binging or inducing vomiting?: No Has the patient recently lost weight without trying?: 0 Has the patient been eating poorly because of a decreased appetite?: 0 Malnutrition Screening Tool Score: 0    Physical Exam  Physical Exam Vitals and nursing note reviewed.  Eyes:     Pupils: Pupils are equal, round, and reactive to light.  Musculoskeletal:        General: Normal range of motion.  Neurological:     Mental Status: She is alert and oriented to person, place, and time.  Psychiatric:        Mood and Affect: Mood normal.        Behavior: Behavior normal.        Thought Content: Thought content normal.        Judgment: Judgment normal.  Review of Systems  Psychiatric/Behavioral:  Positive for depression (stable. denies SI, HI) and substance abuse (Motivated to cease ETOH use). Negative for hallucinations, memory loss and suicidal ideas. The patient is nervous/anxious (stable on current meds) and has insomnia (stable and sleeping on current meds).   All other systems reviewed and are negative.  Blood pressure 128/74, pulse 67, temperature 97.8 F (36.6 C), temperature source Oral, resp. rate 18, SpO2 97%. There is no height or weight on file to calculate BMI.  Demographic Factors:  Low socioeconomic status, Living alone, and Unemployed  Loss Factors: Decrease in vocational  status  Historical Factors: Impulsivity  Risk Reduction Factors:   Sense of responsibility to family  Continued Clinical Symptoms:  Alcohol/Substance Abuse/Dependencies  Cognitive Features That Contribute To Risk:  None    Suicide Risk:  Mild: Denies Suicidal ideation, denies plan or intent to harm self or any one else.  There are no identifiable plans, no associated intent, mild dysphoria and related symptoms, good self-control (both objective and subjective assessment), few other risk factors, and identifiable protective factors, including available and accessible social support.  Plan Of Care/Follow-up recommendations:  Discharged, and a door to door to the [facility] for substance abuse rehabilitation. Patient continuing to verbalize readiness to regain & sustain her sobriety from substances of abuse.  Donia Snell, NP 04/13/2024, 10:52 AM

## 2024-04-13 NOTE — ED Notes (Signed)
 Pt is up and about on the unit.  Pleasant and cooperative. Smiles easily and states that she is feeling well.   Requesting PRN for anxiety and chronic back and neck pain.  Medicated with Robaxin  and Atarax  as per orders.   Pt denies SI/HI/AVH.  She is looking forward to d/c to [facility] this AM.  Pt has 30 day prescriptions and transportation arranged for 1100 this AM.

## 2024-04-13 NOTE — ED Notes (Signed)
 Pt discharged home, ambulatory with [facility] staff at this time.  D/C instructions provided verbally and copy of AVS provided to pt.  All valuables and belongings returned to pt at the time of d/c.

## 2024-04-30 NOTE — ED Provider Notes (Signed)
 "    EMERGENCY DEPARTMENT ENCOUNTER    CHIEF COMPLAINT   Chief Complaint  Patient presents with   Medical Problem    Pt checking in for the third time today with vague complaints. States her sodium could be low, with headache and dry mouth. Neg Covid/FLU/RSV twice today   HPI   Scottlynn Lindell is a 59 y.o. female who presents to the ED with c/o of weakness and dizziness x few days. [facility].  States over the past week she has been feeling more weak and tired as well as dizzy.  Denies vertigo or room spinning sensation, headache, chest pain, difficulty breathing.  States that she has been experiencing some URI symptoms and congestion.  States that she has not been eating or drinking much at the [facility] treatment center as after a few bites the food tastes like poison and she gets a burning sensation in her throat. Prior to [employer] treatment center 3 weeks ago, she was drinking about 4 beers daily.  History of alcohol-related seizures.  Unknown last seizure.  External chart review: Reports patient has history of bipolar 1 disorder, ADHD, GAD, schizoaffective disorder and alcohol use disorder.  Was previously hospitalized for acute on chronic hyponatremia suspected to be hypovolemic due to chronic beer consumption versus psych meds side effect versus SIADH.  PAST MEDICAL HISTORY   Past Medical History:  Diagnosis Date   Allergy    Anxiety    Depression    GERD (gastroesophageal reflux disease)    Hepatitis over 10+yrs   hep c    SURGICAL HISTORY   History reviewed. No pertinent surgical history.  CURRENT MEDICATIONS   Current Medications[1] ALLERGIES   Allergies[2]  FAMILY HISTORY   Family History[3]  SOCIAL HISTORY   Social History[4] REVIEW OF SYSTEMS:  See HPI for further details.  All other systems have been reviewed and are otherwise negative.  PHYSICAL EXAM  VITAL SIGNS: BP (!) 161/86 (BP Location: Left Upper  Arm)   Pulse 78   Temp 98 F (36.7 C)   Resp 16   Ht 1.575 m (5' 2)   Wt 65.8 kg (145 lb)   SpO2 98%   BMI 26.52 kg/m  Constitutional:  Well developed and well nourished.  No acute distress.  Non-toxic in appearance.   Eyes:  PERRL. EOMI. Sclera non-icteric. Conjunctiva normal. No discharge. HENT:  Atraumatic.  Normocephalic.  External ears normal bilaterally.  Bilateral auditory canal and TMs normal to inspection.  Normal appearing nose.  Clear oropharynx without erythema.  No tonsilar swelling or exudates. Midline uvula. Neck:  Normal inspection.  Normal range of motion.  Supple.  No meningeal signs; midline trachea. Respiratory:  Normal breath sounds.  No respiratory distress.  No wheezing, rales, or rhonchi.  No chest tenderness.  Cardiovascular:  Normal heart rate and rhythm. No M/R/G. Equal pulses. Abdomen: (+) BS X4.  Abdomen is soft without tenderness.  No pulsatile masses. No rebound, distention, rigidity, or guarding.  No organomegaly. Extremities:  Intact distal pulses.  No edema or cyanosis.  No obvious deformities.  Good capillary refill.  Compartments soft and nontender. Back: No midline or CVA tenderness.  Skin:  Warm and dry.  No erythema.  No rash.  Neurologic:  Awake and alert. No focal deficits noted.  Grossly normal cognition.  Motor and sensory exam normal.  Normal muscle strength against resistance in bilateral upper and lower extremities.  Ambulatory, steady gait.  Psychiatric:  Affect is appropriate.  Normal behavior.  Normal mood.  RADIOLOGY, LABORATORY STUDIES, AND PROCEDURES Labs Reviewed  CBC AND DIFFERENTIAL - Abnormal; Notable for the following components:      Result Value   MPV 8.5 (*)    ABSOLUTE LYMPHOCYTE COUNT 0.68 (*)    Absolute Immature Granulocyte Count 0.04 (*)    All other components within normal limits  BASIC METABOLIC PANEL - Abnormal; Notable for the following components:   Na 119 (*)    Cl 83 (*)    BUN <5 (*)    All other components  within normal limits   Narrative:    BUN less than linearity, BUN/Creat Ratio not calculated.   URINALYSIS ONLY - NO SYMPTOMS - Abnormal; Notable for the following components:   Urine Ketones Trace (*)    All other components within normal limits  MAGNESIUM  - Normal     MEDICAL DECISION MAKING and ED COURSE:   Nursing Notes and Vital Signs Reviewed. External chart review performed.  59 year old female who presents to the ED with c/o of weakness and dizziness x few days.  Alert and oriented x 3.  No neurological deficits.  Normal muscle strength against resistance in bilateral upper and lower extremities.  Ambulatory with steady gait. No leukocytosis or anemia.  Reassuring analysis and serum magnesium .  Significant hyponatremia with sodium 119.  Chloride 83. Patient does have history of chronic hyponatremia, however her baseline ranges from 125-130.  Previous hospital admission reports that it was hypovolemic hyponatremia likely due to SIADH versus chronic beer consumption versus psych meds.  Suspect that it is exacerbated today due to changes in medication recently as well as poor oral intake.  IV normal saline initiated. I discussed case with Dr. Donzell Daisy who agreed to admit patient for further evaluation and management.  Patient stable upon transfer of care.   Differential diagnosis includes but not limited to: Electrolyte abnormality, substance use, mood disorder, viral syndrome, infection, other  Several of these diagnoses have been evaluated and ruled out by history and physical. As needed, patient will be further evaluated with laboratory and imaging studies. Higher level diagnostics, such as CT imaging, may also be required. Please see work-up portion of the note for further evaluation of patient's risk.  Pulse ox interpretation:  The pulse oximeter revealed 98% on room air which is not hypoxic and normal for the patient as interpreted by me.  Labs:  Reviewed and independent  interpretation by me.  See chart above.     Diagnostic Tests Considered, but not ordered (PERC, PECARN, CA[place] Head CT): None   Medications given in ED: IV normal saline  CRITICAL CARE TIME: 45 minutes Patient at risk for:  Rapid decompensation Treatments/Evaluations:  Close monitoring and treatment of unstable vital signs, cardiorespiratory, and neurologic status, while maintaining tight balance of fluid, respiratory, and cardiac interventions. This includes the administration of emergency fluid management while maintaining close respiratory support as well as the provision of immediate and broad-spectrum antibiotic therapy, while performing a simultaneous assessment for possible sources in order to direct targeted therapy. This time includes discussing the case with the patient and the patient's family. This time also includes the consideration for invasive and chemical support to prevent cardiopulmonary collapse. This time does not include all procedures stated elsewhere in this record. This time also includes reviewing old records, labs and radiological studies. This time includes examining and re-examining the patient. Additionally, this time also includes arranging care with admitting and consulting physicians.  FINAL IMPRESSION   Final diagnoses:  Hyponatremia  Critical care time: 45 minutes  ED Disposition     ED Disposition  Admit   Condition  --   Comment  --           Tinnie Cloria Hoit, PA 05/01/24 0248       [1] No current facility-administered medications for this encounter.   Current Outpatient Medications  Medication Sig Dispense Refill   doxepin  (SINEQUAN ) 10 MG capsule Take 10 mg by mouth nightly.       fluticasone  propionate (FLONASE ) 50 mcg/actuation nasal spray two sprays by Nasal route daily. 16 g 0   lorazepam  (ATIVAN ) 1 MG tablet Take 1 mg by mouth 4 (four) times daily.       omeprazole  (PRILOSEC) 10 MG capsule Take 10 mg by mouth daily.        predniSONE (DELTASONE) 20 mg tablet Take two tablets (40 mg dose) by mouth daily for 4 days. 8 tablet 0  [2] Allergies Allergen Reactions   Sulfa Drugs Cross Reactors Nausea And Vomiting  [3] Family History Problem Relation Name Age of Onset   Depression Mother     Heart disease Paternal Grandmother     Hypertension Mother     Stroke Father    [4] Social History Socioeconomic History   Marital status: Single  Tobacco Use   Smoking status: Every Day    Current packs/day: 1.50    Average packs/day: 1.5 packs/day for 15.0 years (22.5 ttl pk-yrs)    Types: Cigarettes   Smokeless tobacco: Never  Vaping Use   Vaping status: Never Used  Substance and Sexual Activity   Alcohol use: Yes    Comment: occ. drinking    Drug use: Yes    Types: Marijuana   Sexual activity: Not Currently   Tinnie Cloria Hoit, PA 05/01/24 0258  "

## 2024-05-01 NOTE — H&P (Addendum)
 [facility] Patient Name:  Tiffany [name] MRN:  [id number] Hospital Day: 0 PCP: Harlene FORBES Pike, MD [facility] History and Physical  Chief Complaint: Medical Problem (Pt checking in for the third time today with vague complaints. States her sodium could be low, with headache and dry mouth. Neg Covid/FLU/RSV twice today)  HPI: Source of admission: 10- [facility]  This is a 59 y.o.female who presents to the hospital for evaluation of above complaints. Symptoms including generalized weakness, dizziness, nausea and vomiting over the past week or so. Also reports diarrhea past couple weeks. Been at the treatment center past 2 weeks for alcohol detox. Reports decreased oral intake while at the center. Productive cough also reported  CXR clear Viral screen negative  She also thinks she is withdrawing from her ativan , gabapentin . Unknown last ativan  use at the center She does not know her treatments at the center  Was seen in ED twice earlier-no blood work but tonight sodium 119 Last Na  :132 on 04/07/24  Denies seizure  Treatments in ED:IV fluid 1 L ordered  Admitted  Medical history:   Past Medical History:  Diagnosis Date   Allergy    Anxiety    Depression    GERD (gastroesophageal reflux disease)    Hepatitis over 10+yrs   hep c   History reviewed. No pertinent surgical history.  Family History[1] Short Social History[2]    Medications/allergies: Prior to Admission medications  Medication Sig Start Date End Date Taking? Authorizing Provider  doxepin  (SINEQUAN ) 10 MG capsule Take 10 mg by mouth nightly.      Historical Provider, MD  fluticasone  propionate (FLONASE ) 50 mcg/actuation nasal spray two sprays by Nasal route daily. 04/30/24   Amy Marie Kacho, FNP  lorazepam  (ATIVAN ) 1 MG tablet Take 1 mg by mouth 4 (four) times daily.      Historical Provider, MD  omeprazole  (PRILOSEC)  10 MG capsule Take 10 mg by mouth daily.      Historical Provider, MD  predniSONE (DELTASONE) 20 mg tablet Take two tablets (40 mg dose) by mouth daily for 4 days. 04/30/24 05/04/24  Amy Marie Kacho, FNP   Allergies[3]  Review of Systems: Pertinent positive and negative review of systems per HPI.  Physical Examination: BP (!) 161/86 (BP Location: Left Upper Arm)   Pulse 78   Temp 98 F (36.7 C)   Resp 16   Ht 1.575 m (5' 2)   Wt 65.8 kg (145 lb)   SpO2 98%   BMI 26.52 kg/m  Physical Exam HENT:     Mouth/Throat:     Mouth: Mucous membranes are dry.  Cardiovascular:     Rate and Rhythm: Normal rate and regular rhythm.     Pulses: Normal pulses.     Heart sounds: Normal heart sounds.  Pulmonary:     Effort: Pulmonary effort is normal.     Breath sounds: Normal breath sounds.  Abdominal:     Palpations: Abdomen is soft.  Skin:    General: Skin is warm and dry.  Neurological:     General: No focal deficit present.     Mental Status: She is alert and oriented to person, place, and time.  Psychiatric:        Mood and Affect: Mood normal.        Behavior: Behavior normal.     Labs, Microbiology, and Diagnostics: Cardiac Panel: No results for input(s): CK, CKMB,  CTNI, BNP in the last 168 hours. CBC: Recent Labs    Units 04/30/24 2303  WBC thou/mcL 6.0  HGB gm/dL 87.9  PLT thou/mcL 609   BMP: Recent Labs    Units 04/30/24 2303  NA mmol/L 119*  K mmol/L 3.7  CL mmol/L 83*  CO2 mmol/L 25  BUN mg/dL <5*  CREATININE mg/dL 9.19  MAGNESIUM  mg/dL 1.8   Lipid Panel: No results for input(s): CHOL, TRIG, HDL, LDL in the last 168 hours. Liver Enzymes: Recent Labs    Units 04/30/24 2303  AST U/L 28  ALT U/L 23  ALKPHOS U/L 113  BILITOT mg/dL 0.4    Endocrine Panels: No results found for this or any previous visit (from the past 72 hours). No results found for this or any previous visit (from the past 27 weeks). Recent Labs    Units  04/30/24 2303  GLUCOSE mg/dL 895    Microbiology: Microbiology Results     Procedure Component Value Units Date/Time   GI PANEL Stool Stool [[id number]]    Order Status: No result Specimen: Stool    COVID-19, Flu A+B and RSV Nasopharyngeal Swab Nasopharyngeal Swab [[id number]]  (Normal) Collected: 04/30/24 1915   Order Status: Completed Specimen: Nasopharyngeal Swab Updated: 04/30/24 2041    Flu A Negative    Flu B Negative    RSV PCR Negative    SARS-COV-2 Not Detected   Narrative:     SARS-COV-2 (COVID-19)PCR-Negative results do not preclude SARS-CoV-2 infection and should not be used as the sole basis for patient management decisions. Negative results must be combined with clinical observations, patient history, and epidemiological information.  Flu and/or RSV - Negative results do not preclude the presence of Flu or RSV virus and should not be used as the sole basis for treatment or other patient management decisions. False negative results may occur if virus is present at levels below the analytical limit of detection.  This test detects Influenza A, Influenza B, and Respiratory Syncytial Virus and SARS-COV-2 (COVID-19) by PCR.      COVID-19, Flu A+B and RSV Nasopharyngeal Swab Nasopharyngeal Swab [[id number]]  (Normal) Collected: 04/30/24 1743   Order Status: Completed Specimen: Nasopharyngeal Swab Updated: 04/30/24 1849    Flu A Negative    Flu B Negative    RSV PCR Negative    SARS-COV-2 Not Detected   Narrative:     SARS-COV-2 (COVID-19)PCR-Negative results do not preclude SARS-CoV-2 infection and should not be used as the sole basis for patient management decisions. Negative results must be combined with clinical observations, patient history, and epidemiological information.  Flu and/or RSV - Negative results do not preclude the presence of Flu or RSV virus and should not be used as the sole basis for treatment or other patient management decisions. False negative  results may occur if virus is present at levels below the analytical limit of detection.  This test detects Influenza A, Influenza B, and Respiratory Syncytial Virus and SARS-COV-2 (COVID-19) by PCR.          Diagnostics/EKG/Misc: XR Chest Ap Portable Result Date: 04/30/2024 XR CHEST AP PORTABLE: COMPARISON: No comparison. FINDINGS: Nonenlarged cardiomediastinal silhouette. The lungs are clear. No pneumothorax or substantial effusion. No acute osseous abnormality.   IMPRESSION: NO EVIDENCE OF ACUTE CARDIOPULMONARY DISEASE. Electronically Signed by: Camellia JINNY Hoit on 04/30/2024 6:20 PM   Assessment and Plan: 1-Hyponatremia-symptomatic likely hypovolemic in the setting of decreased oral intake/vomiting/diarrhea, alcoholic-receiving detox at treatment center: Admitted Urine osmolarity/Na ordered IV fluid Monitor sodium  level Fall/seizure precautions  2-Productive cough:CXR no acute Viral screen negative Will order CT chest WO contrast Resulted questionable pneumonia vs atelectasis, pneumonia panel, antibiotics ordered Ceftriaxone/doxycycline.  3-Gastroenteritis:Stool studies ordered Monitor electrolytes-supplement if needed NPO until nausea/vomiting resolves  4-Anxiety/depression:verify home meds Ativan  prn anxiety-thinks with drawing  5-Alcohol abuse:resume detox program upon discharge Verify her current treatment at the center and resume  6-chronic active tobacco abuse:education  DVT prophylaxis:Lovenox  daily  The plan discussed with the patient.  Fluid and electrolyte disorder: Hyponatremia, present on admission  Plan: Hydrate with IVF and Repeat BMP in AM  Abnormal blood counts: N/A, present on admission Plan: Repeat CBC in AM  Nutritional status: Body mass index is 26.52 kg/m. Mild protein calorie malnutrition Plan: Nutrition consult and PCP follow up NPO time specified  Coagulation defect: N/A, present on admission Plan: N/A  Elevated LFTs/transaminitis due  to: N/A, present on admission Plan: N/A  Debility: Impaired mobility due to medical condition, present on admission Plan: See plan above  Advance Care Plan:   Code Status: Full Code  I do expect the patient to be admitted for greater than 2 midnights for the management of Hyponatremia  Donzell Daisy, MD [facility] Please contact me via perfect serve or epic secure chat with questions or concerns 05/01/2024, 3:27 AM       [1] Family History Problem Relation Name Age of Onset   Depression Mother     Heart disease Paternal Grandmother     Hypertension Mother     Stroke Father    [2] Social History Tobacco Use   Smoking status: Every Day    Current packs/day: 1.50    Average packs/day: 1.5 packs/day for 15.0 years (22.5 ttl pk-yrs)    Types: Cigarettes   Smokeless tobacco: Never  Vaping Use   Vaping status: Never Used  Substance Use Topics   Alcohol use: Yes    Comment: occ. drinking    Drug use: Yes    Types: Marijuana  [3] Allergies Allergen Reactions   Sulfa Drugs Cross Reactors Nausea And Vomiting  "

## 2024-05-09 NOTE — Progress Notes (Addendum)
 [facility] Case Management     Patient:   Tiffany [name] MR Number:  [id number] Patient Date of Birth: 01/01/1966 Age/Sex:  59 y.o./female     SW met with Pt bedside to introduce self and role.  Pt arrived at [facility] ED BIBA d/t ETOH.  SW reviewed Pt's EMR.  Pt was just d/c from [facility] at earlier today at 3pm with a safe ride to pick up belongings at [facility].  Pt reports shortly after she relapsed and drank alcohol.  Pt is requesting detox.  Pt reports prior to drinking before arrival this evening, she has not had a drink for approximately 1 month.  Pts EMR indicates such, showing Pt was d/c from [facility] on 04/13/24 directly to [facility].  Pt reports she is unable to return to [facility] d/t leaving AMA.  Pt reports she currently has no other plans or options at this time.  PT confirms she connected with [facility] and Burnard Gaskins.  Pt is familiar with [facility] and reports prior contact with them.  SW discussed Pt being able to contact in lobby, for an in person assessment to be conducted.  Pt is currently without a cell phone.  Pt is provided with contact info for [facility] and [facility] mobile crisis.  Pt reports she will contact them at this time, Pt directed to courtesy telephone in lobby.    SW to update attending team and will continue to assist Pt.  PT AVS instructions updated with the following resources.    Addendum: Therapist, Sports on site at 2:35a.m. to conduct in person assessment.    [facility] 24/7/365:   [facility]  [phone]   [facility] [phone]    Substance Use Disorder Treatment:  INPATIENT uninsured/insured  -[facility] [address], [place], [place] [address] [phone]  -[facility]ox facility that accepts people without insurance     -[facility]  [address]  [place], [place] [address].  Office: [phone]   -[facility]  [address]  [place], [place] [address]  Office: [phone]  -[facility]  [address]  [place], [place] [address]  Office: [phone]  -[facility]  [address]  [place], [place] [address].  Office: [phone]  -[facility]  [address]  [place] , [place] [address]  Office: [phone]    -[address], [place], [place] [address]   [phone]  -[facility]   [address] [place] [phone]    Substance Use Disorder Treatment:  INPATIENT   insured  -[facility] (insured) [address] [place], [place] [address] [phone]     -[facility] (insured) [address] [place], [place] [address] [phone]   -[facility] [place], [place] [phone]   [facility] over 250 locations [phone]   -[facility] [phone]   Shelters:  [facility] (day/night shelter) [address] [place], [place] [address] [phone]  [place] Walking Directions: From the main entrance take R on [address] towards [employer], Turn L on [address] [phone])    [facility] [address] [place], [place] [address] [phone] Shelter & Addiction treatment center that accepts people with or without insurance    -across from the front of the hospital you will see [facility], the road to the L of  the building is [employer], walk down this [address] to [employer] it will be on your L    [facility] shelter [address] [place] [address] - [phone] or apply at [facility] Day [facility]   [address] (enter off the driveway), [employer]  Mondays and Wednesdays 9am-1pm    [facility] [employer] [facility] [employer]  [address] (rear of [place])    Check in at 5pm  *Open when temps drop below 30 degrees*   Electronically signed: Tadd Carliss Crimes, MSW 05/09/2024 / 1:02 AM

## 2024-05-09 NOTE — ED Provider Notes (Signed)
 "  EMERGENCY DEPARTMENT ENCOUNTER    CHIEF COMPLAINT   Chief Complaint  Patient presents with   Drug / Alcohol Assessment    Pt states that she recently medically detoxed from alcohol and relapsed today. States she has been drinking all day and is looking for treatment for alcohol    HPI   Tiffany [name] is a 59 y.o. female who presents with concern for alcohol abuse.  The patient states that she is an alcoholic.  [facility] for [facility]onatremia and sent back to [facility] treatment center where she was being seen for alcohol abuse.  Because she had checked out of there they told her she could not stay.  She then went and got some alcohol to drink.  She states she was drinking alcohol up until the time she called EMS.  She states he wants some help with detox again.  She does not know what to do or where to go.  She is not from this area.  She also wants to have her sodium level rechecked.  She denies any SI, HI, or hallucinations.  No other concerns or complaints.  PAST MEDICAL HISTORY   Past Medical History:  Diagnosis Date   Allergy    Anxiety    Depression    GERD (gastroesophageal reflux disease)    Hepatitis over 10+yrs   hep c    SURGICAL HISTORY   History reviewed. No pertinent surgical history.  CURRENT MEDICATIONS   Current Medications[1]  ALLERGIES   Allergies[2]  FAMILY HISTORY   Family History[3]  SOCIAL HISTORY   Social History[4]  REVIEW OF SYSTEMS   Constitutional:  Denies fever, chills, weight loss, or weakness.   Eyes:  Denies photophobia or discharge.   HENT:  Denies sore throat or ear pain.   Respiratory:  Denies cough or shortness of breath.   Cardiovascular:  Denies chest pain, palpitations, or swelling.   GI:  Denies abdominal pain, nausea, vomiting, constipation, diarrhea, melena, or hematochezia. GU: Denies dysuria, increased frequency of urination, hematuria, or pyuria.    Musculoskeletal:   Denies back pain.   Skin:  Denies rash.   Neurologic:  Denies headache, focal weakness, or sensory changes.   Endocrine:  Denies polyuria or polydipsia.   Lymphatic:  Denies swollen glands.   Psychiatric: See HPI.  Denies depression, suicidal ideation, or homicidal ideation.   See HPI for further details.  All other systems reviewed and otherwise negative.  PHYSICAL EXAM   VITAL SIGNS: BP 131/88   Pulse 71   Temp 97.7 F (36.5 C)   Resp 18   Ht 1.575 m (5' 2)   Wt 65.8 kg (145 lb)   SpO2 98%   BMI 26.52 kg/m  Constitutional: Mildly intoxicated appearing.  Smells of alcohol.  No slurred speech.  Well developed and well nourished.  No acute distress.  Non-toxic in appearance.   Eyes:  PERRL. EOMI.  Conjunctiva normal.  No discharge. HENT:  Atraumatic.  Normocephalic.  Ears normal bilaterally.  Nares clear bilaterally.  Normal oropharynx without erythema.  No tonsilar swelling or exudates. Neck:  Normal inspection.  Normal range of motion.  Supple.  No meningeal signs.  No lymphadenopathy. Respiratory:  Normal breath sounds.  No respiratory distress.  No wheezing or rhonchi.  No chest tenderness.  Cardiovascular:  Normal heart rate and rhythm.  Equal pulses. Abdomen:  Soft without tenderness.  No pulsatile masses. No rebound or guarding.  No organomegaly. Extremities:  Intact distal pulses.  No edema.  No tenderness.  No cyanosis.  Normal range of motion in all major joints. No tenderness to palpation.  No major deformities noted.  Back: No midline or CVA tenderness.  Skin:  Warm and dry.  No erythema.  No rash.  Neurologic:  Awake and alert.  No focal deficits noted.  CN II-XII intact.  Motor and sensory exam normal.  GCS 15.  RADIOLOGY, LABORATORY STUDIES, AND PROCEDURES Results for orders placed or performed during the hospital encounter of 05/08/24  Basic Metabolic Panel   Collection Time: 05/09/24 12:30 AM  Result Value Ref Range   Na 131 (L) 136 - 145 mmol/L   Potassium 3.6  3.4 - 5.1 mmol/L   Cl 99 98 - 107 mmol/L   CO2 26 20 - 31 mmol/L   AGAP 6 mmol/L   Glucose 92 74 - 106 mg/dL   BUN 37 (H) 9 - 23 mg/dL   Creatinine 9.30 9.44 - 1.02 mg/dL   Ca 89.6 8.7 - 89.5 mg/dL   BUN/CREAT RATIO 54    eGFR >=60 >=60 mL/min/1.94m2     ED COURSE & MEDICAL DECISION MAKING   Patient presents today with the above HPI/physical exam.  She arrives here today with what appears to be all of her possessions.  She appears to be mildly intoxicated and admits to drinking alcohol prior to arrival.  She called EMS because she relapsed on alcohol after detoxing somewhat at [facility] treatment center and then being admitted to the hospital for hyponatremia.  She arrives to the hospital requesting detox from alcohol.  She also wants to get her sodium rechecked.  Please see the patient's previous discharge note.  She does not meet IVC criteria.  Low concern for withdrawal.  She just drank prior to arrival.  Because she has been sober for multiple days I do not have any concern for any significant withdrawal syndrome.  Alcohol cessation advised.  Lab Work: Abnormal Labs Reviewed  BASIC METABOLIC PANEL - Abnormal; Notable for the following components:      Result Value   Na 131 (*)    BUN 37 (*)    All other components within normal limits  Mild hyponatremia.  No indication for admission or further evaluation here in the ER.  I talked with the social worker who will help the patient with resources.  She is otherwise appropriate for discharge and continued outpatient management.  She will need to metabolize from alcohol prior to discharge unless a sober responsible adult can pick her up.  Return precautions advised and all questions answered.  FINAL IMPRESSION   1. Alcoholic intoxication without complication      2. Alcohol use disorder         ED Disposition     ED Disposition  Discharge   Condition  Stable   Comment  --         Portions of this note may be  dictated using voice recognition software.  Variances in spelling and vocabulary are possible and unintentional.  Not all errors are caught/corrected.  Please notify the dino if any discrepancies are noted or if the meaning of any statement is not clear.      [1] No current facility-administered medications for this encounter.   Current Outpatient Medications  Medication Sig Dispense Refill   atomoxetine  (STRATTERA ) 40 mg capsule Take one capsule (40 mg dose) by mouth daily.     fluticasone  propionate (FLONASE ) 50 mcg/actuation nasal spray  two sprays by Nasal route daily. 16 g 0   gabapentin  (NEURONTIN ) 300 mg capsule Take one capsule (300 mg dose) by mouth 3 (three) times a day. 90 capsule 0   hydrocortisone (ANUSOL-HC) 25 MG suppository Place one suppository (25 mg dose) rectally every 12 (twelve) hours for 5 days. 10 suppository 0   hydrOXYzine  HCl (ATARAX ) 25 mg tablet Take one tablet (25 mg dose) by mouth 3 (three) times a day as needed for Itching or Anxiety for up to 10 days. 30 tablet 0   methocarbamol  (ROBAXIN ) 500 mg tablet Take one tablet (500 mg dose) by mouth 3 (three) times a day as needed for up to 10 days. 10 tablet 0   metoprolol  tartrate (LOPRESSOR ) 25 mg tablet Take one tablet (25 mg dose) by mouth 2 (two) times daily. 60 tablet 0   pantoprazole  sodium (PROTONIX ) 40 mg tablet Take one tablet (40 mg dose) by mouth daily. 30 tablet 0   urea (URE-NA) 15 g PACK Take one packet (15 g dose) by mouth 3 (three) times a day. 90 packet 0  [2] Allergies Allergen Reactions   Sulfa Drugs Cross Reactors Nausea And Vomiting   Amoxicillin Diarrhea   Cephalexin  Diarrhea  [3] Family History Problem Relation Name Age of Onset   Depression Mother     Heart disease Paternal Grandmother     Hypertension Mother     Stroke Father    [4] Social History Socioeconomic History   Marital status: Single  Tobacco Use   Smoking status: Every Day    Current packs/day: 1.50     Average packs/day: 1.5 packs/day for 15.0 years (22.5 ttl pk-yrs)    Types: Cigarettes   Smokeless tobacco: Never  Vaping Use   Vaping status: Never Used  Substance and Sexual Activity   Alcohol use: Yes    Comment: occ. drinking    Drug use: Yes    Types: Marijuana   Sexual activity: Not Currently   Millard LELON Golas, PA-C 05/09/24 0104  "

## 2024-05-21 ENCOUNTER — Emergency Department (HOSPITAL_COMMUNITY)
Admission: EM | Admit: 2024-05-21 | Discharge: 2024-05-21 | Discharge status: Against Medical Advice | Source: Home / Self Care | Attending: Emergency Medicine | Admitting: Emergency Medicine

## 2024-05-21 ENCOUNTER — Encounter (HOSPITAL_COMMUNITY): Payer: Self-pay | Admitting: Emergency Medicine

## 2024-05-21 ENCOUNTER — Other Ambulatory Visit: Payer: Self-pay

## 2024-05-21 DIAGNOSIS — F419 Anxiety disorder, unspecified: Secondary | ICD-10-CM

## 2024-05-21 LAB — COMPREHENSIVE METABOLIC PANEL WITH GFR
ALT: 15 U/L (ref 0–44)
AST: 27 U/L (ref 15–41)
Albumin: 4.9 g/dL (ref 3.5–5.0)
Alkaline Phosphatase: 97 U/L (ref 38–126)
Anion gap: 14 (ref 5–15)
BUN: 11 mg/dL (ref 6–20)
CO2: 25 mmol/L (ref 22–32)
Calcium: 14 mg/dL (ref 8.9–10.3)
Chloride: 90 mmol/L — ABNORMAL LOW (ref 98–111)
Creatinine, Ser: 1.01 mg/dL — ABNORMAL HIGH (ref 0.44–1.00)
GFR, Estimated: >60 mL/min
Glucose, Bld: 123 mg/dL — ABNORMAL HIGH (ref 70–99)
Potassium: 4.3 mmol/L (ref 3.5–5.1)
Sodium: 129 mmol/L — ABNORMAL LOW (ref 135–145)
Total Bilirubin: 0.5 mg/dL (ref 0.0–1.2)
Total Protein: 7.9 g/dL (ref 6.5–8.1)

## 2024-05-21 LAB — CBC WITH DIFFERENTIAL/PLATELET
Abs Immature Granulocytes: 0.01 10*3/uL (ref 0.00–0.07)
Basophils Absolute: 0 10*3/uL (ref 0.0–0.1)
Basophils Relative: 0 %
Eosinophils Absolute: 0.3 10*3/uL (ref 0.0–0.5)
Eosinophils Relative: 3 %
HCT: 39.1 % (ref 36.0–46.0)
Hemoglobin: 13.3 g/dL (ref 12.0–15.0)
Immature Granulocytes: 0 %
Lymphocytes Relative: 32 %
Lymphs Abs: 2.6 10*3/uL (ref 0.7–4.0)
MCH: 31 pg (ref 26.0–34.0)
MCHC: 34 g/dL (ref 30.0–36.0)
MCV: 91.1 fL (ref 80.0–100.0)
Monocytes Absolute: 1.2 10*3/uL — ABNORMAL HIGH (ref 0.1–1.0)
Monocytes Relative: 14 %
Neutro Abs: 4.2 10*3/uL (ref 1.7–7.7)
Neutrophils Relative %: 51 %
Platelets: 332 10*3/uL (ref 150–400)
RBC: 4.29 MIL/uL (ref 3.87–5.11)
RDW: 12.9 % (ref 11.5–15.5)
WBC: 8.2 10*3/uL (ref 4.0–10.5)
nRBC: 0 % (ref 0.0–0.2)

## 2024-05-21 LAB — ETHANOL: Alcohol, Ethyl (B): <15 mg/dL

## 2024-05-21 LAB — MAGNESIUM: Magnesium: 1.4 mg/dL — ABNORMAL LOW (ref 1.7–2.4)

## 2024-05-21 LAB — PHOSPHORUS: Phosphorus: 4.9 mg/dL — ABNORMAL HIGH (ref 2.5–4.6)

## 2024-05-21 MED ORDER — SODIUM CHLORIDE 0.9 % IV BOLUS
1000.0000 mL | Freq: Once | INTRAVENOUS | Status: AC
  Administered 2024-05-21: 1000 mL via INTRAVENOUS

## 2024-05-21 MED ORDER — RISPERIDONE 0.5 MG PO TABS
1.0000 mg | ORAL_TABLET | Freq: Once | ORAL | Status: AC
  Administered 2024-05-21: 1 mg via ORAL
  Filled 2024-05-21: qty 2

## 2024-05-21 NOTE — ED Notes (Signed)
 Lab called and made aware of need for add on labs

## 2024-05-21 NOTE — Discharge Instructions (Signed)
 You are choosing to leave without completing your medical evaluation. Please plan to see your doctor to address your high calcium , and medication concerns.

## 2024-05-21 NOTE — ED Notes (Signed)
 Patient refusing to allow this tech and another tech to get vital signs. Patient states please get me another nurse, these ones are being too pushy. Charge to assist.

## 2024-05-21 NOTE — ED Notes (Signed)
 Patient refused to sign AMA paper prior to leaving department

## 2024-05-21 NOTE — ED Notes (Signed)
 Patient would not allow me to do EKG.

## 2024-05-21 NOTE — ED Triage Notes (Addendum)
 Patient BIB EMS for evaluation of possible psych evaluation related to recently stopping medications.  EMS reports patient had called for shortness of breath and GERD.  On arrival, patient acting bizarre.  Listing to music loudly on phone.  Pt reports she left all her medications on the bus and feels like she might have a heart attack.

## 2024-05-21 NOTE — ED Notes (Signed)
 Pt in the room ripping EKG leads, BP cuff, and O2 monitor off. Pt requesting to leave. States that she has came here voluntarily and would like to go home now. PA aware.

## 2024-05-23 LAB — PTH, INTACT AND CALCIUM
Calcium, Total (PTH): 12.3 mg/dL — ABNORMAL HIGH (ref 8.7–10.2)
PTH: 6 pg/mL — ABNORMAL LOW (ref 15–65)
